# Patient Record
Sex: Male | Born: 1960 | Race: White | Hispanic: No | Marital: Married | Smoking: Never smoker
Health system: Southern US, Community
[De-identification: ages and names within clinical notes are randomized; demographics above are authoritative.]

## PROBLEM LIST (undated history)

## (undated) DIAGNOSIS — I499 Cardiac arrhythmia, unspecified: Secondary | ICD-10-CM

## (undated) DIAGNOSIS — Z9989 Dependence on other enabling machines and devices: Secondary | ICD-10-CM

## (undated) DIAGNOSIS — T8859XA Other complications of anesthesia, initial encounter: Secondary | ICD-10-CM

## (undated) DIAGNOSIS — Z9289 Personal history of other medical treatment: Secondary | ICD-10-CM

## (undated) DIAGNOSIS — Z87442 Personal history of urinary calculi: Secondary | ICD-10-CM

## (undated) DIAGNOSIS — N182 Chronic kidney disease, stage 2 (mild): Secondary | ICD-10-CM

## (undated) DIAGNOSIS — R591 Generalized enlarged lymph nodes: Secondary | ICD-10-CM

## (undated) DIAGNOSIS — T4145XA Adverse effect of unspecified anesthetic, initial encounter: Secondary | ICD-10-CM

## (undated) DIAGNOSIS — I10 Essential (primary) hypertension: Secondary | ICD-10-CM

## (undated) DIAGNOSIS — G4733 Obstructive sleep apnea (adult) (pediatric): Secondary | ICD-10-CM

## (undated) DIAGNOSIS — E7800 Pure hypercholesterolemia, unspecified: Secondary | ICD-10-CM

## (undated) DIAGNOSIS — J42 Unspecified chronic bronchitis: Secondary | ICD-10-CM

## (undated) DIAGNOSIS — I4819 Other persistent atrial fibrillation: Secondary | ICD-10-CM

## (undated) DIAGNOSIS — J189 Pneumonia, unspecified organism: Secondary | ICD-10-CM

## (undated) DIAGNOSIS — Z8719 Personal history of other diseases of the digestive system: Secondary | ICD-10-CM

## (undated) DIAGNOSIS — M1990 Unspecified osteoarthritis, unspecified site: Secondary | ICD-10-CM

## (undated) DIAGNOSIS — L508 Other urticaria: Secondary | ICD-10-CM

## (undated) HISTORY — PX: EXTRACORPOREAL SHOCK WAVE LITHOTRIPSY: SHX1557

## (undated) HISTORY — PX: ELBOW SURGERY: SHX618

## (undated) HISTORY — DX: Pure hypercholesterolemia, unspecified: E78.00

## (undated) HISTORY — PX: JOINT REPLACEMENT: SHX530

## (undated) HISTORY — DX: Generalized enlarged lymph nodes: R59.1

## (undated) HISTORY — PX: TONSILLECTOMY: SUR1361

## (undated) HISTORY — PX: SHOULDER ARTHROSCOPY W/ ROTATOR CUFF REPAIR: SHX2400

## (undated) HISTORY — DX: Essential (primary) hypertension: I10

## (undated) HISTORY — PX: COLONOSCOPY W/ BIOPSIES AND POLYPECTOMY: SHX1376

## (undated) HISTORY — PX: KNEE ARTHROSCOPY: SHX127

## (undated) HISTORY — DX: Chronic kidney disease, stage 2 (mild): N18.2

## (undated) HISTORY — DX: Other urticaria: L50.8

---
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
## 2002-03-31 ENCOUNTER — Emergency Department (HOSPITAL_COMMUNITY): Admission: EM | Admit: 2002-03-31 | Discharge: 2002-03-31 | Payer: Self-pay | Admitting: Emergency Medicine

## 2004-07-12 ENCOUNTER — Encounter: Admission: RE | Admit: 2004-07-12 | Discharge: 2004-07-12 | Payer: Self-pay | Admitting: Orthopedic Surgery

## 2005-04-23 HISTORY — PX: ANTERIOR CERVICAL DECOMP/DISCECTOMY FUSION: SHX1161

## 2005-05-09 ENCOUNTER — Ambulatory Visit (HOSPITAL_COMMUNITY): Admission: RE | Admit: 2005-05-09 | Discharge: 2005-05-10 | Payer: Self-pay | Admitting: Specialist

## 2014-07-28 ENCOUNTER — Ambulatory Visit (HOSPITAL_COMMUNITY)
Admission: RE | Admit: 2014-07-28 | Discharge: 2014-07-28 | Disposition: A | Discharge status: Home / Self Care | Payer: Managed Care, Other (non HMO) | Source: Ambulatory Visit | Attending: Cardiovascular Disease | Admitting: Cardiovascular Disease

## 2014-07-28 ENCOUNTER — Other Ambulatory Visit: Payer: Self-pay | Admitting: *Deleted

## 2014-07-28 DIAGNOSIS — R609 Edema, unspecified: Secondary | ICD-10-CM | POA: Insufficient documentation

## 2014-07-28 NOTE — Progress Notes (Signed)
Left lower extremity venous duplex completed. No evidence for DVT, SVT, or a Baker's cyst. Brianna L Mazza,RVT

## 2014-07-28 NOTE — Progress Notes (Signed)
[facility] ordered a left lower extremity venous doppler for pain and swelling

## 2014-07-29 ENCOUNTER — Telehealth (HOSPITAL_COMMUNITY): Payer: Self-pay | Admitting: *Deleted

## 2014-09-16 ENCOUNTER — Encounter: Payer: Self-pay | Admitting: *Deleted

## 2015-01-31 NOTE — H&P (Signed)
TOTAL KNEE ADMISSION H&P  Patient is being admitted for left total knee arthroplasty.  Subjective:  Chief Complaint:     Left knee primary OA / pain  HPI: Garrett Hanson, 54 y.o. male, has a history of pain and functional disability in the left knee due to arthritis and has failed non-surgical conservative treatments for greater than 12 weeks to include NSAID's and/or analgesics, corticosteriod injections, use of assistive devices and activity modification.  Onset of symptoms was gradual, starting 3+ years ago with gradually worsening course since that time. The patient noted prior procedures on the knee to include  arthroscopy and menisectomy on the left knee(s).  Patient currently rates pain in the left knee(s) at 10 out of 10 with activity. Patient has night pain, worsening of pain with activity and weight bearing, pain that interferes with activities of daily living, pain with passive range of motion, crepitus and joint swelling.  Patient has evidence of periarticular osteophytes and joint space narrowing by imaging studies. There is no active infection.  Risks, benefits and expectations were discussed with the patient.  Risks including but not limited to the risk of anesthesia, blood clots, nerve damage, blood vessel damage, failure of the prosthesis, infection and up to and including death.  Patient understand the risks, benefits and expectations and wishes to proceed with surgery.   PCP: Donovan Kail, MD  D/C Plans:      Home with HHPT  Post-op Meds:       No Rx given   Tranexamic Acid:      To be given - IV  Decadron:      Is to be given  FYI:     Xarelto post-op  Norco post-op    Past Medical History  Diagnosis Date  . Complication of anesthesia     pt states awaken during colonscopy   . Hypertension   . Dysrhythmia     hx of 10 years ago pt states was electrical currently no issues is currently not followed by cardiologist  . History of blood clots     arm during football  injury   . Pneumonia     03/2014  . History of kidney stones   . Arthritis   . History of blood transfusion     at birth     Past Surgical History  Procedure Laterality Date  . Hernia repair      left inguinal 1980  . Shoulder surgery      right approx 8 years ago times 2  . Right elbow surgery     . Crevical disc surgery       approx 7 years ago   . Lithotripsy      No prescriptions prior to admission   Allergies  Allergen Reactions  . Penicillins Anaphylaxis    Has patient had a PCN reaction causing immediate rash, facial/tongue/throat swelling, SOB or lightheadedness with hypotension: Yes Has patient had a PCN reaction causing severe rash involving mucus membranes or skin necrosis: No Has patient had a PCN reaction that required hospitalization:Yes Has patient had a PCN reaction occurring within the last 10 years: No If all of the above answers are "NO", then may proceed with Cephalosporin use.   Marland Kitchen Singulair [Montelukast Sodium] Swelling    Lips, mouth, and hand swelling   . Contrast Media [Iodinated Diagnostic Agents] Palpitations    Broke out in sweats.    Social History  Substance Use Topics  . Smoking status: Never Smoker   .  Smokeless tobacco: Never Used  . Alcohol Use: Yes     Comment: occas     No family history on file.   Review of Systems  Constitutional: Negative.   HENT: Negative.   Eyes: Negative.   Respiratory: Negative.   Cardiovascular: Negative.   Gastrointestinal: Positive for heartburn.  Genitourinary: Negative.   Musculoskeletal: Positive for joint pain.  Skin: Negative.   Neurological: Negative.   Endo/Heme/Allergies: Negative.   Psychiatric/Behavioral: Negative.     Objective:  Physical Exam  Constitutional: He is oriented to person, place, and time. He appears well-developed and well-nourished.  HENT:  Head: Normocephalic and atraumatic.  Eyes: Pupils are equal, round, and reactive to light.  Neck: Neck supple. No JVD  present. No tracheal deviation present. No thyromegaly present.  Cardiovascular: Normal rate, regular rhythm, normal heart sounds and intact distal pulses.   Respiratory: Effort normal and breath sounds normal. No stridor. No respiratory distress. He has no wheezes.  GI: Soft. There is no tenderness. There is no guarding.  Musculoskeletal:       Left knee: He exhibits decreased range of motion, swelling and bony tenderness. He exhibits no ecchymosis, no deformity, no laceration and no erythema. Tenderness found.  Lymphadenopathy:    He has no cervical adenopathy.  Neurological: He is alert and oriented to person, place, and time.  Skin: Skin is warm and dry.  Psychiatric: He has a normal mood and affect.      Imaging Review Plain radiographs demonstrate severe degenerative joint disease of the left knee(s). The overall alignment is neutral. The bone quality appears to be good for age and reported activity level.  Assessment/Plan:  End stage arthritis, left knee   The patient history, physical examination, clinical judgment of the provider and imaging studies are consistent with end stage degenerative joint disease of the left knee(s) and total knee arthroplasty is deemed medically necessary. The treatment options including medical management, injection therapy arthroscopy and arthroplasty were discussed at length. The risks and benefits of total knee arthroplasty were presented and reviewed. The risks due to aseptic loosening, infection, stiffness, patella tracking problems, thromboembolic complications and other imponderables were discussed. The patient acknowledged the explanation, agreed to proceed with the plan and consent was signed. Patient is being admitted for inpatient treatment for surgery, pain control, PT, OT, prophylactic antibiotics, VTE prophylaxis, progressive ambulation and ADL's and discharge planning. The patient is planning to be discharged home with home health  services.      Anastasio Auerbach Jaggar Benko   PA-C  02/03/2015, 3:29 PM

## 2015-02-03 ENCOUNTER — Encounter (HOSPITAL_COMMUNITY): Payer: Self-pay

## 2015-02-03 ENCOUNTER — Encounter (HOSPITAL_COMMUNITY)
Admission: RE | Admit: 2015-02-03 | Discharge: 2015-02-03 | Disposition: A | Discharge status: Home / Self Care | Payer: Managed Care, Other (non HMO) | Source: Ambulatory Visit | Attending: Orthopedic Surgery | Admitting: Orthopedic Surgery

## 2015-02-03 DIAGNOSIS — Z01818 Encounter for other preprocedural examination: Secondary | ICD-10-CM | POA: Diagnosis not present

## 2015-02-03 DIAGNOSIS — M179 Osteoarthritis of knee, unspecified: Secondary | ICD-10-CM | POA: Insufficient documentation

## 2015-02-03 HISTORY — DX: Personal history of other medical treatment: Z92.89

## 2015-02-03 HISTORY — DX: Unspecified osteoarthritis, unspecified site: M19.90

## 2015-02-03 HISTORY — DX: Adverse effect of unspecified anesthetic, initial encounter: T41.45XA

## 2015-02-03 HISTORY — DX: Other complications of anesthesia, initial encounter: T88.59XA

## 2015-02-03 HISTORY — DX: Cardiac arrhythmia, unspecified: I49.9

## 2015-02-03 HISTORY — DX: Pneumonia, unspecified organism: J18.9

## 2015-02-03 HISTORY — DX: Personal history of urinary calculi: Z87.442

## 2015-02-03 LAB — URINALYSIS, ROUTINE W REFLEX MICROSCOPIC
Bilirubin Urine: NEGATIVE
Glucose, UA: NEGATIVE mg/dL
Hgb urine dipstick: NEGATIVE
KETONES UR: NEGATIVE mg/dL
LEUKOCYTES UA: NEGATIVE
NITRITE: NEGATIVE
PROTEIN: NEGATIVE mg/dL
Specific Gravity, Urine: 1.017 (ref 1.005–1.030)
UROBILINOGEN UA: 0.2 mg/dL (ref 0.0–1.0)
pH: 5.5 (ref 5.0–8.0)

## 2015-02-03 LAB — CBC
HEMATOCRIT: 39.3 % (ref 39.0–52.0)
HEMOGLOBIN: 13.7 g/dL (ref 13.0–17.0)
MCH: 29.5 pg (ref 26.0–34.0)
MCHC: 34.9 g/dL (ref 30.0–36.0)
MCV: 84.7 fL (ref 78.0–100.0)
Platelets: 189 10*3/uL (ref 150–400)
RBC: 4.64 MIL/uL (ref 4.22–5.81)
RDW: 13.4 % (ref 11.5–15.5)
WBC: 6.5 10*3/uL (ref 4.0–10.5)

## 2015-02-03 LAB — BASIC METABOLIC PANEL
ANION GAP: 7 (ref 5–15)
BUN: 20 mg/dL (ref 6–20)
CALCIUM: 9.4 mg/dL (ref 8.9–10.3)
CO2: 31 mmol/L (ref 22–32)
Chloride: 104 mmol/L (ref 101–111)
Creatinine, Ser: 1.34 mg/dL — ABNORMAL HIGH (ref 0.61–1.24)
GFR calc non Af Amer: 58 mL/min — ABNORMAL LOW (ref >60)
GLUCOSE: 103 mg/dL — AB (ref 65–99)
POTASSIUM: 3.5 mmol/L (ref 3.5–5.1)
SODIUM: 142 mmol/L (ref 135–145)

## 2015-02-03 LAB — ABO/RH: ABO/RH(D): B POS

## 2015-02-03 LAB — SURGICAL PCR SCREEN
MRSA, PCR: NEGATIVE
STAPHYLOCOCCUS AUREUS: NEGATIVE

## 2015-02-03 LAB — PROTIME-INR
INR: 0.99 (ref 0.00–1.49)
Prothrombin Time: 13.3 seconds (ref 11.6–15.2)

## 2015-02-03 LAB — APTT: APTT: 31 s (ref 24–37)

## 2015-02-03 NOTE — Progress Notes (Signed)
Your patient has screened at an elevated risk for Obstructive Sleep Apnea using the Stop-Bang Tool during a pre-surgical vist. Patient is high risk.

## 2015-02-03 NOTE — Progress Notes (Signed)
CXR results per chart 04/20/2014

## 2015-02-03 NOTE — Patient Instructions (Addendum)
Garrett Hanson  02/03/2015   Your procedure is scheduled on: Monday February 07, 2015   Report to [facility]  Entrance [place]  elevators to 3rd floor to  Short Stay Center at 3:15 PM.  Call this number if you have problems the morning of surgery [phone]   Remember: ONLY 1 PERSON MAY GO WITH YOU TO SHORT STAY TO GET  READY MORNING OF YOUR SURGERY.  Do not eat food After Midnight but may take clear liquids till 11:15 am day of surgery then nothing by mouth.      Take these medicines the morning of surgery with A SIP OF WATER: Famotidine (Pepcid); Metoprolol; May use Veramyst nasal spray if needed (bring with you day of surgery)                               You may not have any metal on your body including hair pins and              piercings  Do not wear jewelry,lotions, powders or colognes, deodorant                          Men may shave face and neck.   Do not bring valuables to the hospital. [place] IS NOT             RESPONSIBLE   FOR VALUABLES.  Contacts, dentures or bridgework may not be worn into surgery.  Leave suitcase in the car. After surgery it may be brought to your room.                Please read over the following fact sheets you were given:MRSA INFORMATION SHEET; INCENTIVE SPIROMETER; BLOOD TRANSFUSION INFORMATION SHEET _____________________________________________________________________             [facility] - Preparing [facility] Before surgery, you can play an important role.  Because skin is not sterile, your skin needs to be as free of germs as possible.  You can reduce the number of germs on your skin by washing with CHG (chlorahexidine gluconate) soap before surgery.  CHG is an antiseptic cleaner which kills germs and bonds with the skin to continue killing germs even after washing. Please DO NOT use if you have an allergy to CHG or antibacterial soaps.  If your skin becomes reddened/irritated stop using the CHG  and inform your nurse when you arrive at Short Stay. Do not shave (including legs and underarms) for at least 48 hours prior to the first CHG shower.  You may shave your face/neck. Please follow these instructions carefully:  1.  Shower with CHG Soap the night before surgery and the  morning of Surgery.  2.  If you choose to wash your hair, wash your hair first as usual with your  normal  shampoo.  3.  After you shampoo, rinse your hair and body thoroughly to remove the  shampoo.                           4.  Use CHG as you would any other liquid soap.  You can apply chg directly  to the skin and wash  Gently with a scrungie or clean washcloth.  5.  Apply the CHG Soap to your body ONLY FROM THE NECK DOWN.   Do not use on face/ open                           Wound or open sores. Avoid contact with eyes, ears mouth and genitals (private parts).                       Wash face,  Genitals (private parts) with your normal soap.             6.  Wash thoroughly, paying special attention to the area where your surgery  will be performed.  7.  Thoroughly rinse your body with warm water from the neck down.  8.  DO NOT shower/wash with your normal soap after using and rinsing off  the CHG Soap.                9.  Pat yourself dry with a clean towel.            10.  Wear clean pajamas.            11.  Place clean sheets on your bed the night of your first shower and do not  sleep with pets. Day of Surgery : Do not apply any lotions/deodorants the morning of surgery.  Please wear clean clothes to the hospital/surgery center.  FAILURE TO FOLLOW THESE INSTRUCTIONS MAY RESULT IN THE CANCELLATION OF YOUR SURGERY PATIENT SIGNATURE_________________________________  NURSE SIGNATURE__________________________________  ________________________________________________________________________    CLEAR LIQUID DIET   Foods Allowed                                                                      Foods Excluded  Coffee and tea, regular and decaf                             liquids that you cannot  Plain Jell-O in any flavor                                             see through such as: Fruit ices (not with fruit pulp)                                     milk, soups, orange juice  Iced Popsicles                                    All solid food Carbonated beverages, regular and diet                                    Cranberry, grape and apple juices Sports drinks like Gatorade Lightly seasoned clear broth or consume(fat free) Sugar, honey syrup  Sample Menu Breakfast                                Lunch                                     Supper Cranberry juice                    Beef broth                            Chicken broth Jell-O                                     Grape juice                           Apple juice Coffee or tea                        Jell-O                                      Popsicle                                                Coffee or tea                        Coffee or tea  _____________________________________________________________________    Incentive Spirometer  An incentive spirometer is a tool that can help keep your lungs clear and active. This tool measures how well you are filling your lungs with each breath. Taking long deep breaths may help reverse or decrease the chance of developing breathing (pulmonary) problems (especially infection) following:  A long period of time when you are unable to move or be active. BEFORE THE PROCEDURE   If the spirometer includes an indicator to show your best effort, your nurse or respiratory therapist will set it to a desired goal.  If possible, sit up straight or lean slightly forward. Try not to slouch.  Hold the incentive spirometer in an upright position. INSTRUCTIONS FOR USE   Sit on the edge of your bed if possible, or sit up as far as you can in bed or on a chair.  Hold the  incentive spirometer in an upright position.  Breathe out normally.  Place the mouthpiece in your mouth and seal your lips tightly around it.  Breathe in slowly and as deeply as possible, raising the piston or the ball toward the top of the column.  Hold your breath for 3-5 seconds or for as long as possible. Allow the piston or ball to fall to the bottom of the column.  Remove the mouthpiece from your mouth and breathe out normally.  Rest for a few seconds and repeat Steps 1 through 7 at least 10 times every 1-2 hours when you are awake. Take your time and take a few normal breaths between  deep breaths.  The spirometer may include an indicator to show your best effort. Use the indicator as a goal to work toward during each repetition.  After each set of 10 deep breaths, practice coughing to be sure your lungs are clear. If you have an incision (the cut made at the time of surgery), support your incision when coughing by placing a pillow or rolled up towels firmly against it. Once you are able to get out of bed, walk around indoors and cough well. You may stop using the incentive spirometer when instructed by your caregiver.  RISKS AND COMPLICATIONS  Take your time so you do not get dizzy or light-headed.  If you are in pain, you may need to take or ask for pain medication before doing incentive spirometry. It is harder to take a deep breath if you are having pain. AFTER USE  Rest and breathe slowly and easily.  It can be helpful to keep track of a log of your progress. Your caregiver can provide you with a simple table to help with this. If you are using the spirometer at home, follow these instructions: [place] IF:   You are having difficultly using the spirometer.  You have trouble using the spirometer as often as instructed.  Your pain medication is not giving enough relief while using the spirometer.  You develop fever of 100.5 F (38.1 C) or higher. SEEK  IMMEDIATE MEDICAL CARE IF:   You cough up bloody sputum that had not been present before.  You develop fever of 102 F (38.9 C) or greater.  You develop worsening pain at or near the incision site. MAKE SURE YOU:   Understand these instructions.  Will watch your condition.  Will get help right away if you are not doing well or get worse. Document Released: 08/20/2006 Document Revised: 07/02/2011 Document Reviewed: 10/21/2006 Exit[employer] Patient Information 2014 ExitCare, [place].   ________________________________________________________________________  WHAT IS A BLOOD TRANSFUSION? Blood Transfusion Information  A transfusion is the replacement of blood or some of its parts. Blood is made up of multiple cells which provide different functions.  Red blood cells carry oxygen and are used for blood loss replacement.  White blood cells fight against infection.  Platelets control bleeding.  Plasma helps clot blood.  Other blood products are available for specialized needs, such as hemophilia or other clotting disorders. BEFORE THE TRANSFUSION  Who gives blood for transfusions?   Healthy volunteers who are fully evaluated to make sure their blood is safe. This is blood bank blood. Transfusion therapy is the safest it has ever been in the practice of medicine. Before blood is taken from a donor, a complete history is taken to make sure that person has no history of diseases nor engages in risky social behavior (examples are intravenous drug use or sexual activity with multiple partners). The donor's travel history is screened to minimize risk of transmitting infections, such as malaria. The donated blood is tested for signs of infectious diseases, such as HIV and hepatitis. The blood is then tested to be sure it is compatible with you in order to minimize the chance of a transfusion reaction. If you or a relative donates blood, this is often done in anticipation of surgery and is not  appropriate for emergency situations. It takes many days to process the donated blood. RISKS AND COMPLICATIONS Although transfusion therapy is very safe and saves many lives, the main dangers of transfusion include:   Getting an infectious disease.  Developing a transfusion reaction. This is an allergic reaction to something in the blood you were given. Every precaution is taken to prevent this. The decision to have a blood transfusion has been considered carefully by your caregiver before blood is given. Blood is not given unless the benefits outweigh the risks. AFTER THE TRANSFUSION  Right after receiving a blood transfusion, you will usually feel much better and more energetic. This is especially true if your red blood cells have gotten low (anemic). The transfusion raises the level of the red blood cells which carry oxygen, and this usually causes an energy increase.  The nurse administering the transfusion will monitor you carefully for complications. HOME CARE INSTRUCTIONS  No special instructions are needed after a transfusion. You may find your energy is better. Speak with your caregiver about any limitations on activity for underlying diseases you may have. SEEK MEDICAL CARE IF:   Your condition is not improving after your transfusion.  You develop redness or irritation at the intravenous (IV) site. SEEK IMMEDIATE MEDICAL CARE IF:  Any of the following symptoms occur over the next 12 hours:  Shaking chills.  You have a temperature by mouth above 102 F (38.9 C), not controlled by medicine.  Chest, back, or muscle pain.  People around you feel you are not acting correctly or are confused.  Shortness of breath or difficulty breathing.  Dizziness and fainting.  You get a rash or develop hives.  You have a decrease in urine output.  Your urine turns a dark color or changes to pink, red, or brown. Any of the following symptoms occur over the next 10 days:  You have a  temperature by mouth above 102 F (38.9 C), not controlled by medicine.  Shortness of breath.  Weakness after normal activity.  The white part of the eye turns yellow (jaundice).  You have a decrease in the amount of urine or are urinating less often.  Your urine turns a dark color or changes to pink, red, or brown. Document Released: 04/06/2000 Document Revised: 07/02/2011 Document Reviewed: 11/24/2007 [facility] Patient Information 2014 [place], [place].  _______________________________________________________________________

## 2015-02-04 NOTE — Progress Notes (Signed)
Clearance dr Tenny Crawross on chart for 02-07-15 surgery

## 2015-02-07 ENCOUNTER — Inpatient Hospital Stay (HOSPITAL_COMMUNITY): Payer: Managed Care, Other (non HMO) | Admitting: Anesthesiology

## 2015-02-07 ENCOUNTER — Encounter (HOSPITAL_COMMUNITY)
Admission: RE | Disposition: A | Discharge status: Home Health | Payer: Self-pay | Source: Ambulatory Visit | Attending: Orthopedic Surgery

## 2015-02-07 ENCOUNTER — Inpatient Hospital Stay (HOSPITAL_COMMUNITY)
Admission: RE | Admit: 2015-02-07 | Discharge: 2015-02-08 | DRG: 470 | Disposition: A | Discharge status: Home Health | Payer: Managed Care, Other (non HMO) | Source: Ambulatory Visit | Attending: Orthopedic Surgery | Admitting: Orthopedic Surgery

## 2015-02-07 ENCOUNTER — Encounter (HOSPITAL_COMMUNITY): Payer: Self-pay | Admitting: *Deleted

## 2015-02-07 DIAGNOSIS — Z96659 Presence of unspecified artificial knee joint: Secondary | ICD-10-CM

## 2015-02-07 DIAGNOSIS — M1712 Unilateral primary osteoarthritis, left knee: Secondary | ICD-10-CM | POA: Diagnosis present

## 2015-02-07 DIAGNOSIS — M659 Synovitis and tenosynovitis, unspecified: Secondary | ICD-10-CM | POA: Diagnosis present

## 2015-02-07 DIAGNOSIS — Z01812 Encounter for preprocedural laboratory examination: Secondary | ICD-10-CM | POA: Diagnosis not present

## 2015-02-07 DIAGNOSIS — I10 Essential (primary) hypertension: Secondary | ICD-10-CM | POA: Diagnosis present

## 2015-02-07 DIAGNOSIS — M25562 Pain in left knee: Secondary | ICD-10-CM | POA: Diagnosis present

## 2015-02-07 DIAGNOSIS — Z96652 Presence of left artificial knee joint: Secondary | ICD-10-CM

## 2015-02-07 HISTORY — PX: TOTAL KNEE ARTHROPLASTY: SHX125

## 2015-02-07 LAB — TYPE AND SCREEN
ABO/RH(D): B POS
Antibody Screen: NEGATIVE

## 2015-02-07 SURGERY — ARTHROPLASTY, KNEE, TOTAL
Anesthesia: Spinal | Site: Knee | Laterality: Left

## 2015-02-07 MED ORDER — DEXAMETHASONE SODIUM PHOSPHATE 10 MG/ML IJ SOLN
10.0000 mg | Freq: Once | INTRAMUSCULAR | Status: AC
  Administered 2015-02-07: 10 mg via INTRAVENOUS

## 2015-02-07 MED ORDER — SODIUM CHLORIDE 0.9 % IJ SOLN
INTRAMUSCULAR | Status: DC | PRN
  Administered 2015-02-07: 30 mL

## 2015-02-07 MED ORDER — CHLORHEXIDINE GLUCONATE 4 % EX LIQD: 60.0000 mL | Freq: Once | CUTANEOUS | Status: DC

## 2015-02-07 MED ORDER — KETOROLAC TROMETHAMINE 30 MG/ML IJ SOLN
INTRAMUSCULAR | Status: DC | PRN
  Administered 2015-02-07: 30 mg

## 2015-02-07 MED ORDER — LOSARTAN POTASSIUM 50 MG PO TABS
100.0000 mg | ORAL_TABLET | Freq: Every morning | ORAL | Status: DC
  Administered 2015-02-08: 100 mg via ORAL
  Filled 2015-02-07: qty 2

## 2015-02-07 MED ORDER — FAMOTIDINE 40 MG PO TABS
40.0000 mg | ORAL_TABLET | Freq: Two times a day (BID) | ORAL | Status: DC
  Administered 2015-02-07 – 2015-02-08 (×2): 40 mg via ORAL
  Filled 2015-02-07 (×3): qty 1

## 2015-02-07 MED ORDER — HYDROCODONE-ACETAMINOPHEN 7.5-325 MG PO TABS
1.0000 | ORAL_TABLET | ORAL | Status: DC
  Administered 2015-02-07 – 2015-02-08 (×4): 2 via ORAL
  Filled 2015-02-07 (×4): qty 2

## 2015-02-07 MED ORDER — PROPOFOL 10 MG/ML IV BOLUS
INTRAVENOUS | Status: AC
  Filled 2015-02-07: qty 20

## 2015-02-07 MED ORDER — FENTANYL CITRATE (PF) 100 MCG/2ML IJ SOLN
INTRAMUSCULAR | Status: DC | PRN
  Administered 2015-02-07 (×2): 50 ug via INTRAVENOUS

## 2015-02-07 MED ORDER — ALUM & MAG HYDROXIDE-SIMETH 200-200-20 MG/5ML PO SUSP: 30.0000 mL | ORAL | Status: DC | PRN

## 2015-02-07 MED ORDER — PHENYLEPHRINE HCL 10 MG/ML IJ SOLN
INTRAMUSCULAR | Status: DC | PRN
  Administered 2015-02-07 (×4): 80 ug via INTRAVENOUS

## 2015-02-07 MED ORDER — SODIUM CHLORIDE 0.9 % IR SOLN
Status: DC | PRN
  Administered 2015-02-07: 1000 mL

## 2015-02-07 MED ORDER — HYDROMORPHONE HCL 1 MG/ML IJ SOLN: 0.2500 mg | INTRAMUSCULAR | Status: DC | PRN

## 2015-02-07 MED ORDER — TRANEXAMIC ACID 1000 MG/10ML IV SOLN
1000.0000 mg | Freq: Once | INTRAVENOUS | Status: AC
  Administered 2015-02-07: 1000 mg via INTRAVENOUS
  Filled 2015-02-07: qty 10

## 2015-02-07 MED ORDER — ONDANSETRON HCL 4 MG/2ML IJ SOLN
INTRAMUSCULAR | Status: DC | PRN
  Administered 2015-02-07: 4 mg via INTRAVENOUS

## 2015-02-07 MED ORDER — BUPIVACAINE-EPINEPHRINE (PF) 0.25% -1:200000 IJ SOLN
INTRAMUSCULAR | Status: DC | PRN
  Administered 2015-02-07: 30 mL

## 2015-02-07 MED ORDER — CELECOXIB 200 MG PO CAPS
200.0000 mg | ORAL_CAPSULE | Freq: Two times a day (BID) | ORAL | Status: DC
  Administered 2015-02-07 – 2015-02-08 (×2): 200 mg via ORAL
  Filled 2015-02-07 (×3): qty 1

## 2015-02-07 MED ORDER — MENTHOL 3 MG MT LOZG: 1.0000 | LOZENGE | OROMUCOSAL | Status: DC | PRN

## 2015-02-07 MED ORDER — BISACODYL 10 MG RE SUPP: 10.0000 mg | Freq: Every day | RECTAL | Status: DC | PRN

## 2015-02-07 MED ORDER — ONDANSETRON HCL 4 MG PO TABS: 4.0000 mg | ORAL_TABLET | Freq: Four times a day (QID) | ORAL | Status: DC | PRN

## 2015-02-07 MED ORDER — ATORVASTATIN CALCIUM 40 MG PO TABS
40.0000 mg | ORAL_TABLET | Freq: Every evening | ORAL | Status: DC
  Administered 2015-02-07: 40 mg via ORAL
  Filled 2015-02-07 (×2): qty 1

## 2015-02-07 MED ORDER — EPHEDRINE SULFATE 50 MG/ML IJ SOLN
INTRAMUSCULAR | Status: DC | PRN
  Administered 2015-02-07: 10 mg via INTRAVENOUS

## 2015-02-07 MED ORDER — HYDROMORPHONE HCL 1 MG/ML IJ SOLN
0.5000 mg | INTRAMUSCULAR | Status: DC | PRN
  Administered 2015-02-08: 1 mg via INTRAVENOUS
  Filled 2015-02-07 (×2): qty 1

## 2015-02-07 MED ORDER — METOCLOPRAMIDE HCL 5 MG/ML IJ SOLN: 5.0000 mg | Freq: Three times a day (TID) | INTRAMUSCULAR | Status: DC | PRN

## 2015-02-07 MED ORDER — BUPIVACAINE-EPINEPHRINE (PF) 0.25% -1:200000 IJ SOLN
INTRAMUSCULAR | Status: AC
  Filled 2015-02-07: qty 30

## 2015-02-07 MED ORDER — MAGNESIUM CITRATE PO SOLN: 1.0000 | Freq: Once | ORAL | Status: DC | PRN

## 2015-02-07 MED ORDER — METOCLOPRAMIDE HCL 10 MG PO TABS: 5.0000 mg | ORAL_TABLET | Freq: Three times a day (TID) | ORAL | Status: DC | PRN

## 2015-02-07 MED ORDER — FLUTICASONE FUROATE 27.5 MCG/SPRAY NA SUSP: 1.0000 | Freq: Every morning | NASAL | Status: DC

## 2015-02-07 MED ORDER — PHENYLEPHRINE 40 MCG/ML (10ML) SYRINGE FOR IV PUSH (FOR BLOOD PRESSURE SUPPORT)
PREFILLED_SYRINGE | INTRAVENOUS | Status: AC
  Filled 2015-02-07: qty 10

## 2015-02-07 MED ORDER — POLYETHYLENE GLYCOL 3350 17 G PO PACK
17.0000 g | PACK | Freq: Two times a day (BID) | ORAL | Status: DC
  Administered 2015-02-07 – 2015-02-08 (×2): 17 g via ORAL

## 2015-02-07 MED ORDER — FENTANYL CITRATE (PF) 100 MCG/2ML IJ SOLN
INTRAMUSCULAR | Status: AC
  Filled 2015-02-07: qty 4

## 2015-02-07 MED ORDER — CEFAZOLIN SODIUM-DEXTROSE 2-3 GM-% IV SOLR: 2.0000 g | INTRAVENOUS | Status: DC

## 2015-02-07 MED ORDER — VANCOMYCIN HCL IN DEXTROSE 1-5 GM/200ML-% IV SOLN
1000.0000 mg | Freq: Two times a day (BID) | INTRAVENOUS | Status: AC
  Administered 2015-02-08: 1000 mg via INTRAVENOUS
  Filled 2015-02-07: qty 200

## 2015-02-07 MED ORDER — DEXAMETHASONE SODIUM PHOSPHATE 10 MG/ML IJ SOLN
INTRAMUSCULAR | Status: AC
Start: 2015-02-07 — End: 2015-02-07
  Filled 2015-02-07: qty 1

## 2015-02-07 MED ORDER — VANCOMYCIN HCL 10 G IV SOLR
1500.0000 mg | INTRAVENOUS | Status: AC
  Administered 2015-02-07: 1500 mg via INTRAVENOUS
  Filled 2015-02-07: qty 1500

## 2015-02-07 MED ORDER — DEXTROSE 5 % IV SOLN
500.0000 mg | Freq: Four times a day (QID) | INTRAVENOUS | Status: DC | PRN
  Filled 2015-02-07: qty 5

## 2015-02-07 MED ORDER — LACTATED RINGERS IV SOLN: INTRAVENOUS | Status: DC

## 2015-02-07 MED ORDER — FLUTICASONE PROPIONATE 50 MCG/ACT NA SUSP
1.0000 | Freq: Every day | NASAL | Status: DC
  Filled 2015-02-07: qty 16

## 2015-02-07 MED ORDER — BUPIVACAINE IN DEXTROSE 0.75-8.25 % IT SOLN
INTRATHECAL | Status: DC | PRN
  Administered 2015-02-07: 2 mL via INTRATHECAL

## 2015-02-07 MED ORDER — HYDROCHLOROTHIAZIDE 25 MG PO TABS
25.0000 mg | ORAL_TABLET | Freq: Every day | ORAL | Status: DC
  Administered 2015-02-08: 25 mg via ORAL
  Filled 2015-02-07: qty 1

## 2015-02-07 MED ORDER — METOPROLOL TARTRATE 25 MG PO TABS
25.0000 mg | ORAL_TABLET | Freq: Two times a day (BID) | ORAL | Status: DC
  Administered 2015-02-07 – 2015-02-08 (×2): 25 mg via ORAL
  Filled 2015-02-07 (×3): qty 1

## 2015-02-07 MED ORDER — SODIUM CHLORIDE 0.9 % IV SOLN
INTRAVENOUS | Status: DC
  Administered 2015-02-07: 22:00:00 via INTRAVENOUS
  Filled 2015-02-07 (×3): qty 1000

## 2015-02-07 MED ORDER — MIDAZOLAM HCL 5 MG/5ML IJ SOLN
INTRAMUSCULAR | Status: DC | PRN
  Administered 2015-02-07: 2 mg via INTRAVENOUS

## 2015-02-07 MED ORDER — LACTATED RINGERS IV SOLN
INTRAVENOUS | Status: DC | PRN
  Administered 2015-02-07 (×2): via INTRAVENOUS

## 2015-02-07 MED ORDER — SODIUM CHLORIDE 0.9 % IJ SOLN
INTRAMUSCULAR | Status: AC
  Filled 2015-02-07: qty 50

## 2015-02-07 MED ORDER — ESMOLOL HCL 10 MG/ML IV SOLN
INTRAVENOUS | Status: AC
  Filled 2015-02-07: qty 10

## 2015-02-07 MED ORDER — PROPOFOL 500 MG/50ML IV EMUL
INTRAVENOUS | Status: DC | PRN
  Administered 2015-02-07: 100 ug/kg/min via INTRAVENOUS

## 2015-02-07 MED ORDER — FERROUS SULFATE 325 (65 FE) MG PO TABS
325.0000 mg | ORAL_TABLET | Freq: Three times a day (TID) | ORAL | Status: DC
  Administered 2015-02-08 (×2): 325 mg via ORAL
  Filled 2015-02-07 (×4): qty 1

## 2015-02-07 MED ORDER — DEXAMETHASONE SODIUM PHOSPHATE 10 MG/ML IJ SOLN
10.0000 mg | Freq: Once | INTRAMUSCULAR | Status: AC
  Administered 2015-02-08: 10 mg via INTRAVENOUS
  Filled 2015-02-07: qty 1

## 2015-02-07 MED ORDER — METHOCARBAMOL 500 MG PO TABS
500.0000 mg | ORAL_TABLET | Freq: Four times a day (QID) | ORAL | Status: DC | PRN
  Administered 2015-02-08: 500 mg via ORAL
  Filled 2015-02-07: qty 1

## 2015-02-07 MED ORDER — ONDANSETRON HCL 4 MG/2ML IJ SOLN: 4.0000 mg | Freq: Four times a day (QID) | INTRAMUSCULAR | Status: DC | PRN

## 2015-02-07 MED ORDER — PHENOL 1.4 % MT LIQD: 1.0000 | OROMUCOSAL | Status: DC | PRN

## 2015-02-07 MED ORDER — ONDANSETRON HCL 4 MG/2ML IJ SOLN
INTRAMUSCULAR | Status: AC
  Filled 2015-02-07: qty 2

## 2015-02-07 MED ORDER — RIVAROXABAN 10 MG PO TABS
10.0000 mg | ORAL_TABLET | ORAL | Status: DC
  Administered 2015-02-08: 10 mg via ORAL
  Filled 2015-02-07 (×2): qty 1

## 2015-02-07 MED ORDER — DOCUSATE SODIUM 100 MG PO CAPS
100.0000 mg | ORAL_CAPSULE | Freq: Two times a day (BID) | ORAL | Status: DC
  Administered 2015-02-07 – 2015-02-08 (×2): 100 mg via ORAL

## 2015-02-07 MED ORDER — KETOROLAC TROMETHAMINE 30 MG/ML IJ SOLN
INTRAMUSCULAR | Status: AC
  Filled 2015-02-07: qty 1

## 2015-02-07 MED ORDER — DIPHENHYDRAMINE HCL 25 MG PO CAPS: 25.0000 mg | ORAL_CAPSULE | Freq: Four times a day (QID) | ORAL | Status: DC | PRN

## 2015-02-07 MED ORDER — MIDAZOLAM HCL 2 MG/2ML IJ SOLN
INTRAMUSCULAR | Status: AC
  Filled 2015-02-07: qty 4

## 2015-02-07 SURGICAL SUPPLY — 53 items
BAG DECANTER FOR FLEXI CONT (MISCELLANEOUS) IMPLANT
BAG ZIPLOCK 12X15 (MISCELLANEOUS) IMPLANT
BANDAGE ELASTIC 6 VELCRO ST LF (GAUZE/BANDAGES/DRESSINGS) ×2 IMPLANT
BANDAGE ESMARK 6X9 LF (GAUZE/BANDAGES/DRESSINGS) ×1 IMPLANT
BLADE SAW SGTL 13.0X1.19X90.0M (BLADE) ×2 IMPLANT
BNDG ESMARK 6X9 LF (GAUZE/BANDAGES/DRESSINGS) ×2
BOWL SMART MIX CTS (DISPOSABLE) ×2 IMPLANT
CAPT KNEE TOTAL 3 ATTUNE ×2 IMPLANT
CEMENT HV SMART SET (Cement) ×4 IMPLANT
CUFF TOURN SGL QUICK 34 (TOURNIQUET CUFF) ×1
CUFF TRNQT CYL 34X4X40X1 (TOURNIQUET CUFF) ×1 IMPLANT
DECANTER SPIKE VIAL GLASS SM (MISCELLANEOUS) ×2 IMPLANT
DRAPE EXTREMITY T 121X128X90 (DRAPE) ×2 IMPLANT
DRAPE POUCH INSTRU U-SHP 10X18 (DRAPES) ×2 IMPLANT
DRAPE U-SHAPE 47X51 STRL (DRAPES) ×2 IMPLANT
DRSG AQUACEL AG ADV 3.5X10 (GAUZE/BANDAGES/DRESSINGS) ×2 IMPLANT
DURAPREP 26ML APPLICATOR (WOUND CARE) ×4 IMPLANT
ELECT REM PT RETURN 9FT ADLT (ELECTROSURGICAL) ×2
ELECTRODE REM PT RTRN 9FT ADLT (ELECTROSURGICAL) ×1 IMPLANT
FACESHIELD WRAPAROUND (MASK) ×10 IMPLANT
GLOVE BIOGEL PI IND STRL 7.5 (GLOVE) ×1 IMPLANT
GLOVE BIOGEL PI IND STRL 8.5 (GLOVE) ×1 IMPLANT
GLOVE BIOGEL PI INDICATOR 7.5 (GLOVE) ×1
GLOVE BIOGEL PI INDICATOR 8.5 (GLOVE) ×1
GLOVE ECLIPSE 8.0 STRL XLNG CF (GLOVE) ×2 IMPLANT
GLOVE ORTHO TXT STRL SZ7.5 (GLOVE) ×4 IMPLANT
GOWN SPEC L3 XXLG W/TWL (GOWN DISPOSABLE) ×2 IMPLANT
GOWN STRL REUS W/TWL LRG LVL3 (GOWN DISPOSABLE) ×2 IMPLANT
HANDPIECE INTERPULSE COAX TIP (DISPOSABLE) ×1
KIT BASIN OR (CUSTOM PROCEDURE TRAY) ×2 IMPLANT
LIQUID BAND (GAUZE/BANDAGES/DRESSINGS) ×2 IMPLANT
MANIFOLD NEPTUNE II (INSTRUMENTS) ×2 IMPLANT
NDL SAFETY ECLIPSE 18X1.5 (NEEDLE) ×1 IMPLANT
NEEDLE HYPO 18GX1.5 SHARP (NEEDLE) ×1
PACK TOTAL JOINT (CUSTOM PROCEDURE TRAY) ×2 IMPLANT
PEN SKIN MARKING BROAD (MISCELLANEOUS) ×2 IMPLANT
POSITIONER SURGICAL ARM (MISCELLANEOUS) ×2 IMPLANT
SET HNDPC FAN SPRY TIP SCT (DISPOSABLE) ×1 IMPLANT
SET PAD KNEE POSITIONER (MISCELLANEOUS) ×2 IMPLANT
SUCTION FRAZIER 12FR DISP (SUCTIONS) ×2 IMPLANT
SUT MNCRL AB 4-0 PS2 18 (SUTURE) ×2 IMPLANT
SUT VIC AB 1 CT1 36 (SUTURE) ×2 IMPLANT
SUT VIC AB 2-0 CT1 27 (SUTURE) ×3
SUT VIC AB 2-0 CT1 TAPERPNT 27 (SUTURE) ×3 IMPLANT
SUT VLOC 180 0 24IN GS25 (SUTURE) ×2 IMPLANT
SYR 50ML LL SCALE MARK (SYRINGE) ×2 IMPLANT
TOWEL OR 17X26 10 PK STRL BLUE (TOWEL DISPOSABLE) ×2 IMPLANT
TOWEL OR NON WOVEN STRL DISP B (DISPOSABLE) ×2 IMPLANT
TRAY FOLEY W/METER SILVER 14FR (SET/KITS/TRAYS/PACK) IMPLANT
TRAY FOLEY W/METER SILVER 16FR (SET/KITS/TRAYS/PACK) ×2 IMPLANT
WATER STERILE IRR 1500ML POUR (IV SOLUTION) ×2 IMPLANT
WRAP KNEE MAXI GEL POST OP (GAUZE/BANDAGES/DRESSINGS) ×2 IMPLANT
YANKAUER SUCT BULB TIP 10FT TU (MISCELLANEOUS) ×2 IMPLANT

## 2015-02-07 NOTE — Interval H&P Note (Signed)
History and Physical Interval Note:  02/07/2015 4:02 PM  Roseanna RainbowMark J Witcher  has presented today for surgery, with the diagnosis of LEFT KNEE OA  The various methods of treatment have been discussed with the patient and family. After consideration of risks, benefits and other options for treatment, the patient has consented to  Procedure(s): LEFT TOTAL KNEE ARTHROPLASTY (Left) as a surgical intervention .  The patient's history has been reviewed, patient examined, no change in status, stable for surgery.  I have reviewed the patient's chart and labs.  Questions were answered to the patient's satisfaction.     Shelda PalLIN,Annett Boxwell D

## 2015-02-07 NOTE — Anesthesia Preprocedure Evaluation (Addendum)
Anesthesia Evaluation  Patient identified by MRN, date of birth, ID band Patient awake    Reviewed: Allergy & Precautions, H&P , NPO status , Patient's Chart, lab work & pertinent test results, reviewed documented beta blocker date and time   Airway Mallampati: II  TM Distance: >3 FB Neck ROM: full    Dental no notable dental hx. (+) Dental Advisory Given, Teeth Intact   Pulmonary neg pulmonary ROS,    Pulmonary exam normal breath sounds clear to auscultation       Cardiovascular Exercise Tolerance: Good hypertension, Pt. on medications and Pt. on home beta blockers Normal cardiovascular exam+ dysrhythmias  Rhythm:regular Rate:Normal     Neuro/Psych negative neurological ROS  negative psych ROS   GI/Hepatic negative GI ROS, Neg liver ROS,   Endo/Other  negative endocrine ROS  Renal/GU negative Renal ROSCRT 1.34  negative genitourinary   Musculoskeletal   Abdominal (+) + obese,   Peds  Hematology negative hematology ROS (+)   Anesthesia Other Findings   Reproductive/Obstetrics negative OB ROS                            Anesthesia Physical Anesthesia Plan  ASA: II  Anesthesia Plan: Spinal   Post-op Pain Management:    Induction:   Airway Management Planned:   Additional Equipment:   Intra-op Plan:   Post-operative Plan:   Informed Consent: I have reviewed the patients History and Physical, chart, labs and discussed the procedure including the risks, benefits and alternatives for the proposed anesthesia with the patient or authorized representative who has indicated his/her understanding and acceptance.   Dental Advisory Given  Plan Discussed with: CRNA and Surgeon  Anesthesia Plan Comments:         Anesthesia Quick Evaluation

## 2015-02-07 NOTE — Anesthesia Procedure Notes (Signed)
Spinal Patient location during procedure: OR Start time: 02/07/2015 5:46 PM End time: 02/07/2015 5:50 PM Staffing Anesthesiologist: Rod Mae Resident/CRNA: Lajuana Carry E Performed by: resident/CRNA  Preanesthetic Checklist Completed: patient identified, site marked, surgical consent, pre-op evaluation, timeout performed, IV checked, risks and benefits discussed and monitors and equipment checked Spinal Block Patient position: sitting Prep: Betadine Patient monitoring: heart rate, continuous pulse ox and blood pressure Location: L3-4 Injection technique: single-shot Needle Needle type: Sprotte  Needle gauge: 24 G Needle length: 10 cm Assessment Sensory level: T6 Additional Notes Expiration date of kit checked and confirmed. First attempt clear CSF, neg heme, neg paresthesia. Patient tolerated procedure well, without complications. Return to supine with assistance

## 2015-02-07 NOTE — Transfer of Care (Signed)
Immediate Anesthesia Transfer of Care Note  Patient: Garrett RainbowMark J Vales  Procedure(s) Performed: Procedure(s): LEFT TOTAL KNEE ARTHROPLASTY (Left)  Patient Location: PACU  Anesthesia Type:Spinal  Level of Consciousness:  sedated, patient cooperative and responds to stimulation  Airway & Oxygen Therapy:Patient Spontanous Breathing   Post-op Assessment:  Report given to PACU RN and Post -op Vital signs reviewed and stable  Post vital signs:  Reviewed and stable  Last Vitals:  Filed Vitals:   02/07/15 1418  BP: 174/98  Pulse: 74  Temp: 36.7 C  Resp: 16    Complications: No apparent anesthesia complications

## 2015-02-07 NOTE — Op Note (Signed)
NAME:  Garrett Hanson [facility]                      MEDICAL RECORD NO.:  [id number]                             FACILITY:  [facility]      PHYSICIAN:  Madlyn Frankel. Charlann Boxer, M.D.  DATE OF BIRTH:  July 20, 1960      DATE OF PROCEDURE:  02/07/2015                                     OPERATIVE REPORT         PREOPERATIVE DIAGNOSIS:  Left knee osteoarthritis.      POSTOPERATIVE DIAGNOSIS:  Left knee osteoarthritis.      FINDINGS:  The patient was noted to have complete loss of cartilage and   bone-on-bone arthritis with associated osteophytes in the medial and patellofemoral compartments of   the knee with a significant synovitis and associated effusion.      PROCEDURE:  Left total knee replacement.      COMPONENTS USED:  DePuy ATTune rotating platform posterior stabilized knee   system, a size 8 femur, 8 tibia, 5 mm PS AOX insert, and 38 anatomic patellar   button.      SURGEON:  Madlyn Frankel. Charlann Boxer, M.D.      ASSISTANT:  Lanney Gins, PA-C.      ANESTHESIA:  Spinal.      SPECIMENS:  None.      COMPLICATION:  None.      DRAINS:  One Hemovac.  EBL: <100cc      TOURNIQUET TIME:   Total Tourniquet Time Documented: Thigh (Left) - 37 minutes Total: Thigh (Left) - 37 minutes      The patient was stable to the recovery room.      INDICATION FOR PROCEDURE:  Garrett Hanson is a 54 y.o. male patient of   mine.  The patient had been seen, evaluated, and treated conservatively in the   office with medication, activity modification, and injections.  The patient had   radiographic changes of bone-on-bone arthritis with endplate sclerosis and osteophytes noted.      The patient failed conservative measures including medication, injections, and activity modification, and at this point was ready for more definitive measures.   Based on the radiographic changes and failed conservative measures, the patient   decided to proceed with total knee replacement.  Risks of infection,   DVT, component failure,  need for revision surgery, postop course, and   expectations were all   discussed and reviewed.  Consent was obtained for benefit of pain   relief.      PROCEDURE IN DETAIL:  The patient was brought to the operative theater.   Once adequate anesthesia, preoperative antibiotics, 1.5 gm of Vancomycin, 1 gm of Tranexamic Acid, and 10 mg of Decadron administered, the patient was positioned supine with the left thigh tourniquet placed.  The  left lower extremity was prepped and draped in sterile fashion.  A time-   out was performed identifying the patient, planned procedure, and   extremity.      The left lower extremity was placed in the [facility] leg holder.  The leg was   exsanguinated, tourniquet elevated to 250 mmHg.  A midline incision was   made followed by  median parapatellar arthrotomy.  Following initial   exposure, attention was first directed to the patella.  Precut   measurement was noted to be 28 mm.  I resected down to 15 mm and used a   38 patellar button to restore patellar height as well as cover the cut   surface.      The lug holes were drilled and a metal shim was placed to protect the   patella from retractors and saw blades.      At this point, attention was now directed to the femur.  The femoral   canal was opened with a drill, irrigated to try to prevent fat emboli.  An   intramedullary rod was passed at 3 degrees valgus, 9 mm of bone was   resected off the distal femur.  Following this resection, the tibia was   subluxated anteriorly.  Using the extramedullary guide, 2 mm of bone was resected off   the proximal medial tibia.  We confirmed the gap would be   stable medially and laterally with a size 5 mm insert as well as confirmed   the cut was perpendicular in the coronal plane, checking with an alignment rod.      Once this was done, I sized the femur to be a size 8 in the anterior-   posterior dimension, chose a standard component based on medial and   lateral  dimension.  The size 8 rotation block was then pinned in   position anterior referenced using the C-clamp to set rotation.  The   anterior, posterior, and  chamfer cuts were made without difficulty nor   notching making certain that I was along the anterior cortex to help   with flexion gap stability.      The final box cut was made off the lateral aspect of distal femur.      At this point, the tibia was sized to be a size 8, the size 8 tray was   then pinned in position through the medial third of the tubercle,   drilled, and keel punched.  Trial reduction was now carried with a 8 femur,  8 tibia, a size 5 mm insert, and the 38 patella botton.  The knee was brought to   extension, full extension with good flexion stability with the patella   tracking through the trochlea without application of pressure.  Given   all these findings, the trial components removed.  Final components were   opened and cement was mixed.  The knee was irrigated with normal saline   solution and pulse lavage.  The synovial lining was   then injected with 30cc of 0.25% Marcaine with epinephrine and 1 cc of Toradol plus 30 cc of NS for a  total of 61 cc.      The knee was irrigated.  Final implants were then cemented onto clean and   dried cut surfaces of bone with the knee brought to extension with a size 5 mm trial insert.      Once the cement had fully cured, the excess cement was removed   throughout the knee.  I confirmed I was satisfied with the range of   motion and stability, and the final size 5 mm PS AOX insert was chosen.  It was   placed into the knee.      The tourniquet had been let down at 37 minutes.  No significant   hemostasis required.  The   extensor mechanism  was then reapproximated using #1 Vicryl and # 0 V-lock sutures with the knee   in flexion.  The   remaining wound was closed with 2-0 Vicryl and running 4-0 Monocryl.   The knee was cleaned, dried, dressed sterilely using Dermabond  and   Aquacel dressing.  The patient was then   brought to recovery room in stable condition, tolerating the procedure   well.   Please note that Physician Assistant, Lanney Gins, PA-C, was present for the entirety of the case, and was utilized for pre-operative positioning, peri-operative retractor management, general facilitation of the procedure.  He was also utilized for primary wound closure at the end of the case.              Madlyn Frankel Charlann Boxer, M.D.    02/07/2015 7:21 PM

## 2015-02-07 NOTE — Anesthesia Postprocedure Evaluation (Signed)
  Anesthesia Post-op Note  Patient: Garrett Hanson  Procedure(s) Performed: Procedure(s) (LRB): LEFT TOTAL KNEE ARTHROPLASTY (Left)  Patient Location: PACU  Anesthesia Type: Spinal  Level of Consciousness: awake and alert   Airway and Oxygen Therapy: Patient Spontanous Breathing  Post-op Pain: mild  Post-op Assessment: Post-op Vital signs reviewed, Patient's Cardiovascular Status Stable, Respiratory Function Stable, Patent Airway and No signs of Nausea or vomiting  Last Vitals:  Filed Vitals:   02/07/15 2030  BP: 116/73  Pulse: 69  Temp: 36.3 C  Resp: 15    Post-op Vital Signs: stable   Complications: No apparent anesthesia complications

## 2015-02-08 ENCOUNTER — Encounter (HOSPITAL_COMMUNITY): Payer: Self-pay | Admitting: Orthopedic Surgery

## 2015-02-08 LAB — BASIC METABOLIC PANEL
ANION GAP: 10 (ref 5–15)
BUN: 22 mg/dL — ABNORMAL HIGH (ref 6–20)
CHLORIDE: 100 mmol/L — AB (ref 101–111)
CO2: 28 mmol/L (ref 22–32)
CREATININE: 1.25 mg/dL — AB (ref 0.61–1.24)
Calcium: 8.6 mg/dL — ABNORMAL LOW (ref 8.9–10.3)
GFR calc non Af Amer: >60 mL/min (ref >60)
Glucose, Bld: 164 mg/dL — ABNORMAL HIGH (ref 65–99)
POTASSIUM: 3.6 mmol/L (ref 3.5–5.1)
SODIUM: 138 mmol/L (ref 135–145)

## 2015-02-08 LAB — CBC
HCT: 37.2 % — ABNORMAL LOW (ref 39.0–52.0)
HEMOGLOBIN: 12.8 g/dL — AB (ref 13.0–17.0)
MCH: 29.2 pg (ref 26.0–34.0)
MCHC: 34.4 g/dL (ref 30.0–36.0)
MCV: 84.7 fL (ref 78.0–100.0)
Platelets: 225 10*3/uL (ref 150–400)
RBC: 4.39 MIL/uL (ref 4.22–5.81)
RDW: 13 % (ref 11.5–15.5)
WBC: 14.7 10*3/uL — AB (ref 4.0–10.5)

## 2015-02-08 MED ORDER — RIVAROXABAN 10 MG PO TABS: 10.0000 mg | ORAL_TABLET | ORAL | Status: DC

## 2015-02-08 MED ORDER — METHOCARBAMOL 500 MG PO TABS: 500.0000 mg | ORAL_TABLET | Freq: Four times a day (QID) | ORAL | Status: DC | PRN

## 2015-02-08 MED ORDER — FERROUS SULFATE 325 (65 FE) MG PO TABS: 325.0000 mg | ORAL_TABLET | Freq: Three times a day (TID) | ORAL | Status: DC

## 2015-02-08 MED ORDER — POLYETHYLENE GLYCOL 3350 17 G PO PACK: 17.0000 g | PACK | Freq: Two times a day (BID) | ORAL | Status: DC

## 2015-02-08 MED ORDER — HYDROCODONE-ACETAMINOPHEN 7.5-325 MG PO TABS: 1.0000 | ORAL_TABLET | ORAL | Status: DC | PRN

## 2015-02-08 MED ORDER — HYDROMORPHONE HCL 2 MG PO TABS: 2.0000 mg | ORAL_TABLET | ORAL | Status: DC | PRN

## 2015-02-08 MED ORDER — DOCUSATE SODIUM 100 MG PO CAPS: 100.0000 mg | ORAL_CAPSULE | Freq: Two times a day (BID) | ORAL | Status: DC

## 2015-02-08 NOTE — Discharge Instructions (Addendum)
Information on my medicine - XARELTO® (Rivaroxaban) ° °This medication education was reviewed with me or my healthcare representative as part of my discharge preparation.  The pharmacist that spoke with me during my hospital stay was:  Glogovac,nikola, RPH ° °Why was Xarelto® prescribed for you? °Xarelto® was prescribed for you to reduce the risk of blood clots forming after orthopedic surgery. The medical term for these abnormal blood clots is venous thromboembolism (VTE). ° °What do you need to know about xarelto® ? °Take your Xarelto® ONCE DAILY at the same time every day. °You may take it either with or without food. ° °If you have difficulty swallowing the tablet whole, you may crush it and mix in applesauce just prior to taking your dose. ° °Take Xarelto® exactly as prescribed by your doctor and DO NOT stop taking Xarelto® without talking to the doctor who prescribed the medication.  Stopping without other VTE prevention medication to take the place of Xarelto® may increase your risk of developing a clot. ° °After discharge, you should have regular check-up appointments with your healthcare provider that is prescribing your Xarelto®.   ° °What do you do if you miss a dose? °If you miss a dose, take it as soon as you remember on the same day then continue your regularly scheduled once daily regimen the next day. Do not take two doses of Xarelto® on the same day.  ° °Important Safety Information °A possible side effect of Xarelto® is bleeding. You should call your healthcare provider right away if you experience any of the following: °? Bleeding from an injury or your nose that does not stop. °? Unusual colored urine (red or dark brown) or unusual colored stools (red or black). °? Unusual bruising for unknown reasons. °? A serious fall or if you hit your head (even if there is no bleeding). ° °Some medicines may interact with Xarelto® and might increase your risk of bleeding while on Xarelto®. To help avoid  this, consult your healthcare provider or pharmacist prior to using any new prescription or non-prescription medications, including herbals, vitamins, non-steroidal anti-inflammatory drugs (NSAIDs) and supplements. ° °This website has more information on Xarelto®: www.xarelto.com. ° °_____________________________________________________________________________________________________________________________________ ° ° ° ° ° °INSTRUCTIONS AFTER JOINT REPLACEMENT  ° °o Remove items at home which could result in a fall. This includes throw rugs or furniture in walking pathways °o ICE to the affected joint every three hours while awake for 30 minutes at a time, for at least the first 3-5 days, and then as needed for pain and swelling.  Continue to use ice for pain and swelling. You may notice swelling that will progress down to the foot and ankle.  This is normal after surgery.  Elevate your leg when you are not up walking on it.   °o Continue to use the breathing machine you got in the hospital (incentive spirometer) which will help keep your temperature down.  It is common for your temperature to cycle up and down following surgery, especially at night when you are not up moving around and exerting yourself.  The breathing machine keeps your lungs expanded and your temperature down. ° ° °DIET:  As you were doing prior to hospitalization, we recommend a well-balanced diet. ° °DRESSING / WOUND CARE / SHOWERING ° °Keep the surgical dressing until follow up.  The dressing is water proof, so you can shower without any extra covering.  IF THE DRESSING FALLS OFF or the wound gets wet inside, change the dressing   with sterile gauze.  Please use good hand washing techniques before changing the dressing.  Do not use any lotions or creams on the incision until instructed by your surgeon.   ° °ACTIVITY ° °o Increase activity slowly as tolerated, but follow the weight bearing instructions below.   °o No driving for 6 weeks or  until further direction given by your physician.  You cannot drive while taking narcotics.  °o No lifting or carrying greater than 10 lbs. until further directed by your surgeon. °o Avoid periods of inactivity such as sitting longer than an hour when not asleep. This helps prevent blood clots.  °o You may return to work once you are authorized by your doctor.  ° ° ° °WEIGHT BEARING  ° °Weight bearing as tolerated with assist device (walker, cane, etc) as directed, use it as long as suggested by your surgeon or therapist, typically at least 4-6 weeks. ° ° °EXERCISES ° °Results after joint replacement surgery are often greatly improved when you follow the exercise, range of motion and muscle strengthening exercises prescribed by your doctor. Safety measures are also important to protect the joint from further injury. Any time any of these exercises cause you to have increased pain or swelling, decrease what you are doing until you are comfortable again and then slowly increase them. If you have problems or questions, call your caregiver or physical therapist for advice.  ° °Rehabilitation is important following a joint replacement. After just a few days of immobilization, the muscles of the leg can become weakened and shrink (atrophy).  These exercises are designed to build up the tone and strength of the thigh and leg muscles and to improve motion. Often times heat used for twenty to thirty minutes before working out will loosen up your tissues and help with improving the range of motion but do not use heat for the first two weeks following surgery (sometimes heat can increase post-operative swelling).  ° °These exercises can be done on a training (exercise) mat, on the floor, on a table or on a bed. Use whatever works the best and is most comfortable for you.    Use music or television while you are exercising so that the exercises are a pleasant break in your day. This will make your life better with the exercises  acting as a break in your routine that you can look forward to.   Perform all exercises about fifteen times, three times per day or as directed.  You should exercise both the operative leg and the other leg as well. ° °Exercises include: °  °• Quad Sets - Tighten up the muscle on the front of the thigh (Quad) and hold for 5-10 seconds.   °• Straight Leg Raises - With your knee straight (if you were given a brace, keep it on), lift the leg to 60 degrees, hold for 3 seconds, and slowly lower the leg.  Perform this exercise against resistance later as your leg gets stronger.  °• Leg Slides: Lying on your back, slowly slide your foot toward your buttocks, bending your knee up off the floor (only go as far as is comfortable). Then slowly slide your foot back down until your leg is flat on the floor again.  °• Angel Wings: Lying on your back spread your legs to the side as far apart as you can without causing discomfort.  °• Hamstring Strength:  Lying on your back, push your heel against the floor with your leg straight   by tightening up the muscles of your buttocks.  Repeat, but this time bend your knee to a comfortable angle, and push your heel against the floor.  You may put a pillow under the heel to make it more comfortable if necessary.  ° °A rehabilitation program following joint replacement surgery can speed recovery and prevent re-injury in the future due to weakened muscles. Contact your doctor or a physical therapist for more information on knee rehabilitation.  ° ° °CONSTIPATION ° °Constipation is defined medically as fewer than three stools per week and severe constipation as less than one stool per week.  Even if you have a regular bowel pattern at home, your normal regimen is likely to be disrupted due to multiple reasons following surgery.  Combination of anesthesia, postoperative narcotics, change in appetite and fluid intake all can affect your bowels.  ° °YOU MUST use at least one of the following  options; they are listed in order of increasing strength to get the job done.  They are all available over the counter, and you may need to use some, POSSIBLY even all of these options:   ° °Drink plenty of fluids (prune juice may be helpful) and high fiber foods °Colace 100 mg by mouth twice a day  °Senokot for constipation as directed and as needed Dulcolax (bisacodyl), take with full glass of water  °Miralax (polyethylene glycol) once or twice a day as needed. ° °If you have tried all these things and are unable to have a bowel movement in the first 3-4 days after surgery call either your surgeon or your primary doctor.   ° °If you experience loose stools or diarrhea, hold the medications until you stool forms back up.  If your symptoms do not get better within 1 week or if they get worse, check with your doctor.  If you experience "the worst abdominal pain ever" or develop nausea or vomiting, please contact the office immediately for further recommendations for treatment. ° ° °ITCHING:  If you experience itching with your medications, try taking only a single pain pill, or even half a pain pill at a time.  You can also use Benadryl over the counter for itching or also to help with sleep.  ° °TED HOSE STOCKINGS:  Use stockings on both legs until for at least 2 weeks or as directed by physician office. They may be removed at night for sleeping. ° °MEDICATIONS:  See your medication summary on the “After Visit Summary” that nursing will review with you.  You may have some home medications which will be placed on hold until you complete the course of blood thinner medication.  It is important for you to complete the blood thinner medication as prescribed. ° °PRECAUTIONS:  If you experience chest pain or shortness of breath - call 911 immediately for transfer to the hospital emergency department.  ° °If you develop a fever greater that 101 F, purulent drainage from wound, increased redness or drainage from wound, foul  odor from the wound/dressing, or calf pain - CONTACT YOUR SURGEON.   °                                                °FOLLOW-UP APPOINTMENTS:  If you do not already have a post-op appointment, please call the office for an appointment to be seen by your surgeon.    Guidelines for how soon to be seen are listed in your “After Visit Summary”, but are typically between 1-4 weeks after surgery. ° °OTHER INSTRUCTIONS:  ° °Knee Replacement:  Do not place pillow under knee, focus on keeping the knee straight while resting.  ° °MAKE SURE YOU:  °• Understand these instructions.  °• Get help right away if you are not doing well or get worse.  ° ° °Thank you for letting us be a part of your medical care team.  It is a privilege we respect greatly.  We hope these instructions will help you stay on track for a fast and full recovery!  °  °

## 2015-02-08 NOTE — Progress Notes (Signed)
Patient ID: Philis FendtMark J Velarde, male   DOB: 01-03-1961, 54 y.o.   MRN: [id number] Subjective: 1 Day Post-Op Procedure(s) (LRB): LEFT TOTAL KNEE ARTHROPLASTY (Left)    Patient reports pain as mild.  Comfortably up in recliner this morning.  With knee flexed to 90 degrees and demonstrating active knee extension already  Objective:   VITALS:   Filed Vitals:   02/08/15 0524  BP: 120/76  Pulse: 72  Temp: 97.8 F (36.6 C)  Resp: 16    Neurovascular intact Incision: dressing C/D/I  LABS  Recent Labs  02/08/15 0500  HGB 12.8*  HCT 37.2*  WBC 14.7*  PLT 225     Recent Labs  02/08/15 0500  NA 138  K 3.6  BUN 22*  CREATININE 1.25*  GLUCOSE 164*    No results for input(s): LABPT, INR in the last 72 hours.   Assessment/Plan: 1 Day Post-Op Procedure(s) (LRB): LEFT TOTAL KNEE ARTHROPLASTY (Left)   Advance diet Up with therapy Discharge home with home health today after therapy  RTC in 2 weeks

## 2015-02-08 NOTE — Evaluation (Signed)
Physical Therapy Evaluation Patient Details Name: Garrett Hanson MRN: [id number] DOB: 1960-12-05 Today's Date: 02/08/2015   History of Present Illness  s/p L TKA  Clinical Impression  Pt admitted with above diagnosis. Pt currently with functional limitations due to the deficits listed below (see PT Problem List).  Pt will benefit from skilled PT to increase their independence and safety with mobility to allow discharge to the venue listed below.  Will see again later for stair training     Follow Up Recommendations Home health PT    Equipment Recommendations  Rolling walker with 5" wheels    Recommendations for Other Services       Precautions / Restrictions Precautions Precautions: Knee;Fall Restrictions Other Position/Activity Restrictions: WBAT      Mobility  Bed Mobility               General bed mobility comments: in chair  Transfers Overall transfer level: Needs assistance Equipment used: Rolling walker (2 wheeled) Transfers: Sit to/from Stand Sit to Stand: Supervision         General transfer comment: cues for safety adn hand placement  Ambulation/Gait Ambulation/Gait assistance: Supervision Ambulation Distance (Feet): 120 Feet Assistive device: Rolling walker (2 wheeled) Gait Pattern/deviations: Step-through pattern     General Gait Details: cues for RW safety during turns  Stairs            Wheelchair Mobility    Modified Rankin (Stroke Patients Only)       Balance                                             Pertinent Vitals/Pain Pain Assessment: 0-10 Pain Score: 6  Pain Location: L knee Pain Descriptors / Indicators: Aching;Sore Pain Intervention(s): Limited activity within patient's tolerance;Monitored during session;Premedicated before session    Home Living Family/patient expects to be discharged to:: Private residence Living Arrangements: Spouse/significant other   Type of Home: House Home  Access: Stairs to enter Entrance Stairs-Rails: None Entrance Stairs-Number of Steps: 3 Home Layout: Multi-level Home Equipment: Cane - single point;Crutches      Prior Function Level of Independence: Independent;Independent with assistive device(s)         Comments: cane priro to adm     Hand Dominance        Extremity/Trunk Assessment   Upper Extremity Assessment: Overall WFL for tasks assessed           Lower Extremity Assessment: LLE deficits/detail   LLE Deficits / Details: active knee flexion to ~90*; strength at least 3/5 knee ext adn flex     Communication   Communication: No difficulties  Cognition Arousal/Alertness: Awake/alert Behavior During Therapy: WFL for tasks assessed/performed Overall Cognitive Status: Within Functional Limits for tasks assessed                      General Comments      Exercises Total Joint Exercises Quad Sets: AROM;10 reps;Both Long Arc Quad: AROM;5 reps;Left Knee Flexion: AROM;5 reps;Left;Seated      Assessment/Plan    PT Assessment Patient needs continued PT services  PT Diagnosis Difficulty walking   PT Problem List Decreased strength;Decreased range of motion;Decreased mobility;Decreased knowledge of use of DME;Decreased knowledge of precautions  PT Treatment Interventions DME instruction;Gait training;Functional mobility training;Patient/family education;Therapeutic exercise;Stair training   PT Goals (Current goals can be found in the Care  Plan section) Acute Rehab PT Goals Patient Stated Goal: to be able to go upstairs PT Goal Formulation: With patient Time For Goal Achievement: 02/10/15 Potential to Achieve Goals: Good    Frequency 7X/week   Barriers to discharge        Co-evaluation               End of Session Equipment Utilized During Treatment: Gait belt Activity Tolerance: Patient tolerated treatment well Patient left: in chair;with call bell/phone within reach            Time: 0926-1000 PT Time Calculation (min) (ACUTE ONLY): 34 min   Charges:   PT Evaluation $Initial PT Evaluation Tier I: 1 Procedure PT Treatments $Gait Training: 8-22 mins   PT G Codes:        Nilza Eaker 02/08/2015, 10:02 AM

## 2015-02-08 NOTE — Discharge Summary (Signed)
Physician Discharge Summary  Patient ID: Garrett Hanson MRN: [id number] DOB/AGE: [birth year] 54 y.o.  Admit date: 02/07/2015 Discharge date:  02/08/2015  Procedures:  Procedure(s) (LRB): LEFT TOTAL KNEE ARTHROPLASTY (Left)  Attending Physician:  Dr. Durene RomansMatthew Olin   Admission Diagnoses:   Left knee primary OA / pain  Discharge Diagnoses:  Principal Problem:   S/P left TKA Active Problems:   S/P knee replacement  Past Medical History  Diagnosis Date  . Complication of anesthesia     pt states awaken during colonscopy   . Hypertension   . Dysrhythmia     hx of 10 years ago pt states was electrical currently no issues is currently not followed by cardiologist  . History of blood clots     arm during football injury   . Pneumonia     03/2014  . History of kidney stones   . Arthritis   . History of blood transfusion     at birth     HPI:    Garrett Hanson, 54 y.o. male, has a history of pain and functional disability in the left knee due to arthritis and has failed non-surgical conservative treatments for greater than 12 weeks to include NSAID's and/or analgesics, corticosteriod injections, use of assistive devices and activity modification. Onset of symptoms was gradual, starting 3+ years ago with gradually worsening course since that time. The patient noted prior procedures on the knee to include arthroscopy and menisectomy on the left knee(s). Patient currently rates pain in the left knee(s) at 10 out of 10 with activity. Patient has night pain, worsening of pain with activity and weight bearing, pain that interferes with activities of daily living, pain with passive range of motion, crepitus and joint swelling. Patient has evidence of periarticular osteophytes and joint space narrowing by imaging studies. There is no active infection. Risks, benefits and expectations were discussed with the patient. Risks including but not limited to the risk of anesthesia, blood  clots, nerve damage, blood vessel damage, failure of the prosthesis, infection and up to and including death. Patient understand the risks, benefits and expectations and wishes to proceed with surgery.   PCP: Donovan KailOSS,ALLeN, MD   Discharged Condition: good  Hospital Course:  Patient underwent the above stated procedure on 02/07/2015. Patient tolerated the procedure well and brought to the recovery room in good condition and subsequently to the floor.  POD #1 BP: 120/76 ; Pulse: 72 ; Temp: 97.8 F (36.6 C) ; Resp: 16 Patient reports pain as mild. Comfortably up in recliner this morning. With knee flexed to 90 degrees and demonstrating active knee extension already.  Ready to be discharged home. Dorsiflexion/plantar flexion intact, incision: dressing C/D/I, no cellulitis present and compartment soft.   LABS  Basename    HGB  12.8  HCT  37.2    Discharge Exam: General appearance: alert, cooperative and no distress Extremities: Homans sign is negative, no sign of DVT, no edema, redness or tenderness in the calves or thighs and no ulcers, gangrene or trophic changes  Disposition:  Home with follow up in 2 weeks   Follow-up Information    Follow up with Shelda PalLIN,Christelle Igoe D, MD.   Specialty:  Orthopedic Surgery   Contact information:   [address] [place] [address] [phone]       Follow up with [facility].   Specialty:  Home Health Services   Why:  home healthphysical therapy   Contact information:   [address]  [address] [place] [address] [phone]       Discharge Instructions    Call MD / Call 911    Complete by:  As directed   If you experience chest pain or shortness of breath, CALL 911 and be transported to the hospital emergency room.  If you develope a fever above 101 F, pus (white drainage) or increased drainage or redness at the wound, or calf pain, call your surgeon's office.     Change dressing    Complete by:  As  directed   Maintain surgical dressing until follow up in the clinic. If the edges start to pull up, may reinforce with tape. If the dressing is no longer working, may remove and cover with gauze and tape, but must keep the area dry and clean.  Call with any questions or concerns.     Constipation Prevention    Complete by:  As directed   Drink plenty of fluids.  Prune juice may be helpful.  You may use a stool softener, such as Colace (over the counter) 100 mg twice a day.  Use MiraLax (over the counter) for constipation as needed.     Diet - low sodium heart healthy    Complete by:  As directed      Discharge instructions    Complete by:  As directed   Maintain surgical dressing until follow up in the clinic. If the edges start to pull up, may reinforce with tape. If the dressing is no longer working, may remove and cover with gauze and tape, but must keep the area dry and clean.  Follow up in 2 weeks at [facility]. Call with any questions or concerns.     Increase activity slowly as tolerated    Complete by:  As directed   Weight bearing as tolerated with assist device (walker, cane, etc) as directed, use it as long as suggested by your surgeon or therapist, typically at least 4-6 weeks.     TED hose    Complete by:  As directed   Use stockings (TED hose) for 2 weeks on both leg(s).  You may remove them at night for sleeping.             Medication List    TAKE these medications        atorvastatin 40 MG tablet  Commonly known as:  LIPITOR  Take 40 mg by mouth every evening.     celecoxib 200 MG capsule  Commonly known as:  CELEBREX  Take 200 mg by mouth every morning.     docusate sodium 100 MG capsule  Commonly known as:  COLACE  Take 1 capsule (100 mg total) by mouth 2 (two) times daily.     famotidine 40 MG tablet  Commonly known as:  PEPCID  Take 40 mg by mouth 2 (two) times daily.     ferrous sulfate 325 (65 FE) MG tablet  Take 1 tablet (325 mg total)  by mouth 3 (three) times daily after meals.     fluticasone 27.5 MCG/SPRAY nasal spray  Commonly known as:  VERAMYST  Place 1 spray into the nose every morning.     hydrochlorothiazide 25 MG tablet  Commonly known as:  HYDRODIURIL  Take 25 mg by mouth daily.     HYDROcodone-acetaminophen 7.5-325 MG tablet  Commonly known as:  NORCO  Take 1-2 tablets by mouth every 4 (four) hours as needed for moderate pain.     HYDROmorphone 2  MG tablet  Commonly known as:  DILAUDID  Take 1-2 tablets (2-4 mg total) by mouth every 4 (four) hours as needed for severe pain (breakthrough pain).     lactobacillus acidophilus Tabs tablet  Take 1 tablet by mouth daily.     losartan 100 MG tablet  Commonly known as:  COZAAR  Take 100 mg by mouth every morning.     methocarbamol 500 MG tablet  Commonly known as:  ROBAXIN  Take 1 tablet (500 mg total) by mouth every 6 (six) hours as needed for muscle spasms.     metoprolol tartrate 25 MG tablet  Commonly known as:  LOPRESSOR  Take 25 mg by mouth 2 (two) times daily.     polyethylene glycol packet  Commonly known as:  MIRALAX / GLYCOLAX  Take 17 g by mouth 2 (two) times daily.     rivaroxaban 10 MG Tabs tablet  Commonly known as:  XARELTO  Take 1 tablet (10 mg total) by mouth daily.         Signed: Anastasio Auerbach. Delano Frate   PA-C  02/08/2015, 1:21 PM

## 2015-02-08 NOTE — Evaluation (Signed)
Occupational Therapy Evaluation Patient Details Name: Garrett FendtMark J Sherrod MRN: [id number] DOB: Aug 18, 1960 Today's Date: 02/08/2015    History of Present Illness s/p L TKA   Clinical Impression   This 54 year old man was admitted for the above surgery. All education was completed.  No further OT is needed at this time.      Follow Up Recommendations  No OT follow up;Supervision/Assistance - 24 hour    Equipment Recommendations    None by OT.  Pt plans to get a shower seat.  He doesn't want DME for toilet   Recommendations for Other Services       Precautions / Restrictions Precautions Precautions: Knee;Fall Restrictions Weight Bearing Restrictions: No Other Position/Activity Restrictions: WBAT      Mobility Bed Mobility               General bed mobility comments: oob  Transfers Overall transfer level: Needs assistance Equipment used: Rolling walker (2 wheeled) Transfers: Sit to/from Stand Sit to Stand: Supervision         General transfer comment: cues for UE placement and safety    Balance                                            ADL Overall ADL's : Needs assistance/impaired     Grooming: Supervision/safety;Standing   Upper Body Bathing: Set up;Sitting   Lower Body Bathing: Minimal assistance;Sit to/from stand   Upper Body Dressing : Set up;Sitting   Lower Body Dressing: Moderate assistance;Sit to/from stand   Toilet Transfer: Min guard;Ambulation;Comfort height toilet   Toileting- Clothing Manipulation and Hygiene: Min guard;Sit to/from stand   Tub/ Shower Transfer: Min guard;Ambulation;Walk-in shower     General ADL Comments: pt ambulated to bathroom, practiced commode and shower transfer and donned shorts.  Pt does not want a 3:1 for home.  He was able to get up from comfort height commode with min guard for safety.  He has a comfort height downstairs and bathroom upstairs that is not master has a sink next to  commode.  Educated on sequence for dressing.  Pt motivated     Advice workerVision     Perception     Praxis      Pertinent Vitals/Pain Pain Assessment: Faces Pain Score: 6  Faces Pain Scale: Hurts even more Pain Location: R knee Pain Descriptors / Indicators: Aching Pain Intervention(s): Limited activity within patient's tolerance;Monitored during session;Premedicated before session;Repositioned;Ice applied     Hand Dominance     Extremity/Trunk Assessment Upper Extremity Assessment Upper Extremity Assessment: Overall WFL for tasks assessed          Communication Communication Communication: No difficulties   Cognition Arousal/Alertness: Awake/alert Behavior During Therapy: WFL for tasks assessed/performed Overall Cognitive Status: Within Functional Limits for tasks assessed                     General Comments       Exercises       Shoulder Instructions      Home Living Family/patient expects to be discharged to:: Private residence Living Arrangements: Spouse/significant other   Type of Home: House Home Access: Stairs to enter Secretary/administratorntrance Stairs-Number of Steps: 3 Entrance Stairs-Rails: None Home Layout: Multi-level Alternate Level Stairs-Number of Steps: 12+ Alternate Level Stairs-Rails: Right Bathroom Shower/Tub: Walk-in shower;Door   Allied Waste IndustriesBathroom Toilet:  (has high downstairs; others slightly higher)  Home Equipment: Cane - single point;Crutches          Prior Functioning/Environment Level of Independence: Independent;Independent with assistive device(s)        Comments: cane priro to adm    OT Diagnosis: Acute pain   OT Problem List:     OT Treatment/Interventions:      OT Goals(Current goals can be found in the care plan section) Acute Rehab OT Goals Patient Stated Goal: to be able to go upstairs  OT Frequency:     Barriers to D/C:            Co-evaluation              End of Session    Activity Tolerance: Patient  tolerated treatment well Patient left: in chair;with call bell/phone within reach   Time: 1039-1058 OT Time Calculation (min): 19 min Charges:  OT General Charges $OT Visit: 1 Procedure OT Evaluation $Initial OT Evaluation Tier I: 1 Procedure G-Codes:    Negin Hegg 2015-03-03, 11:16 AM Marica Otter, OTR/L [phone] Mar 03, 2015

## 2015-02-08 NOTE — Progress Notes (Signed)
Physical Therapy Treatment Patient Details Name: Garrett FendtMark J Hanson MRN: [id number] DOB: 1960-07-29 Today's Date: 02/08/2015    History of Present Illness s/p L TKA    PT Comments    POD # 1 pm session.  With spouse practiced stairs.  Instructed on safety with walker during gait.  Instructed on HEP freq and use of ICE.  Instructed on proper positioning L LE in extension.  Instructed no pillow under knee and no kneeling. Pt ready for D/C to home.   Follow Up Recommendations  Home health PT     Equipment Recommendations  Rolling walker with 5" wheels    Recommendations for Other Services       Precautions / Restrictions Precautions Precautions: Knee;Fall Precaution Comments: instructed no pillow under knee and no kneeling Restrictions Weight Bearing Restrictions: No Other Position/Activity Restrictions: WBAT    Mobility  Bed Mobility               General bed mobility comments: OOB in recliner  Transfers Overall transfer level: Needs assistance Equipment used: Rolling walker (2 wheeled) Transfers: Sit to/from Stand Sit to Stand: Supervision         General transfer comment: increased time.  Good use of hands  Ambulation/Gait Ambulation/Gait assistance: Supervision Ambulation Distance (Feet): 185 Feet Assistive device: Rolling walker (2 wheeled) Gait Pattern/deviations: Step-through pattern Gait velocity: WFL   General Gait Details: cues for RW safety during turns   Stairs Stairs: Yes Stairs assistance: Min guard Stair Management: One rail Right;With cane Number of Stairs: 4 General stair comments: with spouse.  Instructed on proper sequencing and safety  Wheelchair Mobility    Modified Rankin (Stroke Patients Only)       Balance                                    Cognition Arousal/Alertness: Awake/alert Behavior During Therapy: WFL for tasks assessed/performed Overall Cognitive Status: Within Functional Limits for tasks  assessed                      Exercises      General Comments        Pertinent Vitals/Pain Pain Assessment: 0-10 Pain Score: 5  Pain Location: R knee Pain Descriptors / Indicators: Burning Pain Intervention(s): Monitored during session;Repositioned;Ice applied    Home Living                      Prior Function            PT Goals (current goals can now be found in the care plan section) Progress towards PT goals: Progressing toward goals    Frequency  7X/week    PT Plan      Co-evaluation             End of Session Equipment Utilized During Treatment: Gait belt Activity Tolerance: Patient tolerated treatment well Patient left: in chair;with call bell/phone within reach     Time: 1345-1400 PT Time Calculation (min) (ACUTE ONLY): 15 min  Charges:  $Gait Training: 8-22 mins                    G Codes:      Felecia ShellingLori Elizah Lydon  PTA [facility]  Acute  Rehab Pager      [phone]

## 2015-02-08 NOTE — Care Management Note (Addendum)
Case Management Note  Patient Details  Name: Garrett Hanson MRN: [id number] Date of Birth: 01/18/1961  Subjective/Objective:                   LEFT TOTAL KNEE ARTHROPLASTY (Left) Action/Plan: Discharge planning  Expected Discharge Date:  02/08/15               Expected Discharge Plan:  [place]  In-House Referral:     Discharge planning Services  CM Consult  Post Acute Care Choice:  Home Health Choice offered to:  Patient  DME Arranged:  N/A DME Agency:  NA  HH Arranged:  PT HH Agency:  [facility]  Status of Service:  Completed, signed off  Medicare Important Message Given:    Date Medicare IM Given:    Medicare IM give by:    Date Additional Medicare IM Given:    Additional Medicare Important Message give by:     If discussed at New Point of Stay Meetings, dates discussed:    Additional Comments: Utilization Review complete. CM met with pt in room to offer choice; choices are limited bc of [employer] and pt agrees to [facility].  Pt states he has a rolling walker and elevated commode and does not need any DME.  Referral called to [facility] rep, Judson Roch who accepts referral and requests I fax facesheet, order, F2F, H&P, OP note, Progress note and PT EVAL note to [phone].  Cm faxed requested information and received confirmation of receipt.  No other CM needs were communicated. Dellie Catholic, RN 02/08/2015, 1:18 PM

## 2015-03-08 ENCOUNTER — Other Ambulatory Visit (HOSPITAL_COMMUNITY): Payer: Self-pay | Admitting: Orthopedic Surgery

## 2015-03-08 ENCOUNTER — Ambulatory Visit (HOSPITAL_COMMUNITY)
Admission: RE | Admit: 2015-03-08 | Discharge: 2015-03-08 | Disposition: A | Discharge status: Home / Self Care | Payer: Managed Care, Other (non HMO) | Source: Ambulatory Visit | Attending: Cardiovascular Disease | Admitting: Cardiovascular Disease

## 2015-03-08 DIAGNOSIS — M79605 Pain in left leg: Secondary | ICD-10-CM

## 2015-03-08 DIAGNOSIS — M7989 Other specified soft tissue disorders: Secondary | ICD-10-CM | POA: Diagnosis not present

## 2015-03-08 DIAGNOSIS — I10 Essential (primary) hypertension: Secondary | ICD-10-CM | POA: Insufficient documentation

## 2015-03-11 ENCOUNTER — Encounter (HOSPITAL_COMMUNITY): Payer: Managed Care, Other (non HMO)

## 2016-03-12 ENCOUNTER — Other Ambulatory Visit: Payer: Self-pay | Admitting: Family Medicine

## 2016-03-12 DIAGNOSIS — R1031 Right lower quadrant pain: Secondary | ICD-10-CM

## 2016-03-14 ENCOUNTER — Ambulatory Visit
Admission: RE | Admit: 2016-03-14 | Discharge: 2016-03-14 | Disposition: A | Discharge status: Home / Self Care | Payer: Managed Care, Other (non HMO) | Source: Ambulatory Visit | Attending: Family Medicine | Admitting: Family Medicine

## 2016-03-14 ENCOUNTER — Other Ambulatory Visit: Payer: Self-pay | Admitting: Family Medicine

## 2016-03-14 DIAGNOSIS — R1031 Right lower quadrant pain: Secondary | ICD-10-CM

## 2016-04-05 ENCOUNTER — Other Ambulatory Visit (HOSPITAL_COMMUNITY): Payer: Self-pay | Admitting: Family Medicine

## 2016-04-05 DIAGNOSIS — R590 Localized enlarged lymph nodes: Secondary | ICD-10-CM

## 2016-04-11 ENCOUNTER — Encounter: Payer: Self-pay | Admitting: Hematology and Oncology

## 2016-04-11 ENCOUNTER — Telehealth: Payer: Self-pay | Admitting: Hematology and Oncology

## 2016-04-11 NOTE — Telephone Encounter (Signed)
Appt scheduled w/Gudena on 1/2 @345pm . Pt agreed to appt date and time. Demographics verified. Pt's insurance will change in the beginning of the year. He's aware to bring it to registration.

## 2016-04-24 ENCOUNTER — Ambulatory Visit (HOSPITAL_BASED_OUTPATIENT_CLINIC_OR_DEPARTMENT_OTHER): Payer: BLUE CROSS/BLUE SHIELD | Admitting: Hematology and Oncology

## 2016-04-24 DIAGNOSIS — R5383 Other fatigue: Secondary | ICD-10-CM

## 2016-04-24 DIAGNOSIS — R590 Localized enlarged lymph nodes: Secondary | ICD-10-CM

## 2016-04-24 MED ORDER — LEVOFLOXACIN 750 MG PO TABS: 750.0000 mg | ORAL_TABLET | Freq: Every day | ORAL | 0 refills | Status: DC

## 2016-04-25 ENCOUNTER — Encounter: Payer: Self-pay | Admitting: Hematology and Oncology

## 2016-04-25 DIAGNOSIS — R590 Localized enlarged lymph nodes: Secondary | ICD-10-CM | POA: Insufficient documentation

## 2016-04-25 NOTE — Assessment & Plan Note (Signed)
Para-aortic and right iliac lymphadenopathy: Largest measured 1.2 cm I reviewed the CT scan of the patient and reviewed the lymph nodes that were enlarged. These lymph nodes have not markedly increased in size compared to 2009. We discussed the differential diagnosis of lymphadenopathy. This differential includes 1. Reactive causes related to underlying inflammation/infection: Patient has recurrent diarrhea and possibly has irritable bowel disease. He also has had recurrent kidney stones. Either of these causes could cause reactive lymphadenopathy. 2. other causes including CLL/lymphoma is were also discussed. He does not have any increase in the lymphocyte count and his differential on the CBC was normal therefore is unlikely to be CLL. We cannot rule out other benign indolent lymphoma is without a tissue diagnosis. These lymph nodes in the abdominal area are relatively small and are not amenable for CT-guided biopsy. There are no other easily biopsy able clinically enlarged lymph nodes in his neck or axilla or groin. It is for this reason I did not recommend any biopsies.  Severe fatigue: Worsening over the past 1 year. He has gained significant weight in this timeframe. I discussed with him that I agree with Dr. Tenny Crawoss who had discussed with him that he needs a sleep study to evaluate for obstructive sleep apnea. He does have mild elevation of serum creatinine however he does not have any history of diabetes. His TSH was normal as well.  Recommendation: Please obtain a CT chest abdomen pelvis in 2 years for follow-up of lymphadenopathy. If it remains stable then no further CT scans are necessary.  Return to clinic if there are any additional suspicious findings for lymphoma or the CT scan shows any abnormalities that suggest progression of lymphadenopathy.

## 2016-04-25 NOTE — Progress Notes (Signed)
[facility] CONSULT NOTE  Patient Care Team: Daisy Floro, MD as PCP - General  CHIEF COMPLAINTS/PURPOSE OF CONSULTATION:  Enlarged abdominal lymph nodes, fatigue  HISTORY OF PRESENTING ILLNESS:  Garrett Hanson 56 y.o. male is here because of recent diagnosis of enlarged aortic and right iliac chain lymph nodes largest measuring 1.2 cm. Patient has had recent problems with right groin pain that is quite intermittent. He also has severe fatigue for the past 1 year. Because of the symptoms he underwent a CT of his abdomen and pelvis. He did have arthritis changes his hip joint. In addition to that he was found to have a aortic and right iliac lymph node measuring 1.2 cm in maximum dimension. He was sent to [place] for discussion regarding the lymphadenopathy and his complaint of worsening fatigue. He has had left knee replacement surgery. His wife remarks that he may be walking differently since his surgery and that may be putting pressure in his right hip.  I reviewed her records extensively and collaborated the history with the patient.  MEDICAL HISTORY:  Past Medical History:  Diagnosis Date  . Arthritis   . Complication of anesthesia    pt states awaken during colonscopy   . Dysrhythmia    hx of 10 years ago pt states was electrical currently no issues is currently not followed by cardiologist  . History of blood clots    arm during football injury   . History of blood transfusion    at birth   . History of kidney stones   . Hypertension   . Pneumonia    03/2014    SURGICAL HISTORY: Past Surgical History:  Procedure Laterality Date  . crevical disc surgery      approx 7 years ago   . HERNIA REPAIR     left inguinal 1980  . LITHOTRIPSY    . right elbow surgery     . SHOULDER SURGERY     right approx 8 years ago times 2  . TOTAL KNEE ARTHROPLASTY Left 02/07/2015   Procedure: LEFT TOTAL KNEE ARTHROPLASTY;  Surgeon: Durene Romans, MD;  Location: [facility];   Service: Orthopedics;  Laterality: Left;    SOCIAL HISTORY: Patient works at [employer] History   Social History  . Marital status: Married    Spouse name: N/A  . Number of children: N/A  . Years of education: N/A   Occupational History  . Not on file.   Social History Main Topics  . Smoking status: Never Smoker  . Smokeless tobacco: Never Used  . Alcohol use Yes     Comment: occas   . Drug use: No  . Sexual activity: Not on file   Other Topics Concern  . Not on file   Social History Narrative  . No narrative on file    FAMILY HISTORY: No family history on file.  ALLERGIES:  is allergic to penicillins; singulair [montelukast sodium]; and contrast media [iodinated diagnostic agents].  MEDICATIONS:  Current Outpatient Prescriptions  Medication Sig Dispense Refill  . atorvastatin (LIPITOR) 40 MG tablet Take 40 mg by mouth every evening.    . celecoxib (CELEBREX) 200 MG capsule Take 200 mg by mouth every morning.    . docusate sodium (COLACE) 100 MG capsule Take 1 capsule (100 mg total) by mouth 2 (two) times daily. 10 capsule 0  . famotidine (PEPCID) 40 MG tablet Take 40 mg by mouth 2 (two) times daily.    . ferrous sulfate  325 (65 FE) MG tablet Take 1 tablet (325 mg total) by mouth 3 (three) times daily after meals.  3  . fluticasone (VERAMYST) 27.5 MCG/SPRAY nasal spray Place 1 spray into the nose every morning.    . hydrochlorothiazide (HYDRODIURIL) 25 MG tablet Take 25 mg by mouth daily.    Marland Kitchen HYDROcodone-acetaminophen (NORCO) 7.5-325 MG tablet Take 1-2 tablets by mouth every 4 (four) hours as needed for moderate pain. 100 tablet 0  . HYDROmorphone (DILAUDID) 2 MG tablet Take 1-2 tablets (2-4 mg total) by mouth every 4 (four) hours as needed for severe pain (breakthrough pain). 20 tablet 0  . lactobacillus acidophilus (BACID) TABS tablet Take 1 tablet by mouth daily.    Marland Kitchen levofloxacin (LEVAQUIN) 750 MG tablet Take 1 tablet (750 mg total) by mouth daily. 7 tablet 0   . losartan (COZAAR) 100 MG tablet Take 100 mg by mouth every morning.    . methocarbamol (ROBAXIN) 500 MG tablet Take 1 tablet (500 mg total) by mouth every 6 (six) hours as needed for muscle spasms. 50 tablet 0  . metoprolol tartrate (LOPRESSOR) 25 MG tablet Take 25 mg by mouth 2 (two) times daily.    . polyethylene glycol (MIRALAX / GLYCOLAX) packet Take 17 g by mouth 2 (two) times daily. 14 each 0  . rivaroxaban (XARELTO) 10 MG TABS tablet Take 1 tablet (10 mg total) by mouth daily. 14 tablet 0   No current facility-administered medications for this visit.     REVIEW OF SYSTEMS:   Constitutional: Denies fevers, chills or abnormal night sweats, Complains of profound fatigue for the past 1 year Eyes: Denies blurriness of vision, double vision or watery eyes Ears, nose, mouth, throat, and face: Denies mucositis or sore throat Respiratory: Denies cough, dyspnea or wheezes Cardiovascular: Denies palpitation, chest discomfort or lower extremity swelling Gastrointestinal:  Denies nausea, heartburn or change in bowel habits Skin: Denies abnormal skin rashes Lymphatics: Denies new lymphadenopathy or easy bruising Neurological:Denies numbness, tingling or new weaknesses Behavioral/Psych: Mood is stable, no new changes  Extremities: Intermittent right hip pain All other systems were reviewed with the patient and are negative.  PHYSICAL EXAMINATION: ECOG PERFORMANCE STATUS: 1 - Symptomatic but completely ambulatory  Vitals:   04/24/16 1548  BP: 137/89  Pulse: 82  Resp: 18  Temp: 98.1 F (36.7 C)   Filed Weights   04/24/16 1548  Weight: 294 lb 14.4 oz (133.8 kg)    GENERAL:alert, no distress and comfortable SKIN: skin color, texture, turgor are normal, no rashes or significant lesions EYES: normal, conjunctiva are pink and non-injected, sclera clear OROPHARYNX:no exudate, no erythema and lips, buccal mucosa, and tongue normal  NECK: supple, thyroid normal size, non-tender, without  nodularity LYMPH:  no palpable lymphadenopathy in the cervical, axillary or inguinal LUNGS: clear to auscultation and percussion with normal breathing effort HEART: regular rate & rhythm and no murmurs and no lower extremity edema ABDOMEN:abdomen soft, non-tender and normal bowel sounds Musculoskeletal:no cyanosis of digits and no clubbing  PSYCH: alert & oriented x 3 with fluent speech NEURO: no focal motor/sensory deficits LABORATORY DATA:  I have reviewed the data as listed Lab Results  Component Value Date   WBC 14.7 (H) 02/08/2015   HGB 12.8 (L) 02/08/2015   HCT 37.2 (L) 02/08/2015   MCV 84.7 02/08/2015   PLT 225 02/08/2015   Lab Results  Component Value Date   NA 138 02/08/2015   K 3.6 02/08/2015   CL 100 (L) 02/08/2015   CO2  28 02/08/2015    RADIOGRAPHIC STUDIES: I have personally reviewed the radiological reports and agreed with the findings in the report.  ASSESSMENT AND PLAN:  Lymphadenopathy, abdominal Para-aortic and right iliac lymphadenopathy: Largest measured 1.2 cm I reviewed the CT scan of the patient and reviewed the lymph nodes that were enlarged. These lymph nodes have not markedly increased in size compared to 2009. We discussed the differential diagnosis of lymphadenopathy. This differential includes 1. Reactive causes related to underlying inflammation/infection: Patient has recurrent diarrhea and possibly has irritable bowel disease. He also has had recurrent kidney stones. Either of these causes could cause reactive lymphadenopathy. 2. other causes including CLL/lymphoma is were also discussed. He does not have any increase in the lymphocyte count and his differential on the CBC was normal therefore is unlikely to be CLL. We cannot rule out other benign indolent lymphoma is without a tissue diagnosis. These lymph nodes in the abdominal area are relatively small and are not amenable for CT-guided biopsy. There are no other easily biopsy able clinically  enlarged lymph nodes in his neck or axilla or groin. It is for this reason I did not recommend any biopsies.  Severe fatigue: Worsening over the past 1 year. He has gained significant weight in this timeframe. I discussed with him that I agree with Dr. Tenny Crawoss who had discussed with him that he needs a sleep study to evaluate for obstructive sleep apnea. He does have mild elevation of serum creatinine however he does not have any history of diabetes. His TSH was normal as well.  Recommendation: Please obtain a CT chest abdomen pelvis in 2 years for follow-up of lymphadenopathy. If it remains stable then no further CT scans are necessary.  Return to clinic if there are any additional suspicious findings for lymphoma or the CT scan shows any abnormalities that suggest progression of lymphadenopathy.    All questions were answered. The patient knows to call the clinic with any problems, questions or concerns.    Sabas SousGudena, Keegan Bensch K, MD 04/25/16

## 2016-08-27 DIAGNOSIS — M25551 Pain in right hip: Secondary | ICD-10-CM | POA: Diagnosis not present

## 2016-08-27 DIAGNOSIS — M160 Bilateral primary osteoarthritis of hip: Secondary | ICD-10-CM | POA: Diagnosis not present

## 2016-08-27 DIAGNOSIS — M25552 Pain in left hip: Secondary | ICD-10-CM | POA: Diagnosis not present

## 2016-08-30 DIAGNOSIS — M1612 Unilateral primary osteoarthritis, left hip: Secondary | ICD-10-CM | POA: Diagnosis not present

## 2016-09-14 DIAGNOSIS — R21 Rash and other nonspecific skin eruption: Secondary | ICD-10-CM | POA: Diagnosis not present

## 2016-10-04 DIAGNOSIS — G4719 Other hypersomnia: Secondary | ICD-10-CM | POA: Diagnosis not present

## 2016-10-04 DIAGNOSIS — R0681 Apnea, not elsewhere classified: Secondary | ICD-10-CM | POA: Diagnosis not present

## 2016-10-28 DIAGNOSIS — G4733 Obstructive sleep apnea (adult) (pediatric): Secondary | ICD-10-CM | POA: Diagnosis not present

## 2016-10-29 DIAGNOSIS — G4733 Obstructive sleep apnea (adult) (pediatric): Secondary | ICD-10-CM | POA: Diagnosis not present

## 2016-11-12 DIAGNOSIS — I10 Essential (primary) hypertension: Secondary | ICD-10-CM | POA: Diagnosis not present

## 2016-11-12 DIAGNOSIS — R21 Rash and other nonspecific skin eruption: Secondary | ICD-10-CM | POA: Diagnosis not present

## 2016-11-12 DIAGNOSIS — E7800 Pure hypercholesterolemia, unspecified: Secondary | ICD-10-CM | POA: Diagnosis not present

## 2016-11-12 DIAGNOSIS — I4891 Unspecified atrial fibrillation: Secondary | ICD-10-CM | POA: Diagnosis not present

## 2016-11-12 DIAGNOSIS — Z0000 Encounter for general adult medical examination without abnormal findings: Secondary | ICD-10-CM | POA: Diagnosis not present

## 2016-11-14 ENCOUNTER — Telehealth: Payer: Self-pay

## 2016-11-14 NOTE — Telephone Encounter (Signed)
Sent notes to scheduling 

## 2016-11-15 DIAGNOSIS — G4733 Obstructive sleep apnea (adult) (pediatric): Secondary | ICD-10-CM | POA: Diagnosis not present

## 2016-12-04 DIAGNOSIS — R109 Unspecified abdominal pain: Secondary | ICD-10-CM | POA: Diagnosis not present

## 2016-12-14 ENCOUNTER — Encounter: Payer: Self-pay | Admitting: Physician Assistant

## 2016-12-14 NOTE — Progress Notes (Signed)
Cardiology Office Note    Date:  12/17/2016  ID:  Garrett Hanson, DOB 06/01/1960, MRN [id number] PCP:  Garrett Floro, MD  Cardiologist:  Garrett Hanson, reviewed with Dr. Excell Hanson   Chief Complaint: evaluate atrial fib  History of Present Illness:  Garrett Hanson is a 56 y.o. male with history of kidney stones, PNA, arthritis, para-aortic/right iliac lymphadenopathy (surveillance by 04/2016 heme-onc note), probable CKD stage II who is being seen today for the evaluation of atrial fib at the request of Dr. Daisy Hanson. He reports a remote history of a tachy-arrhythmia, details unclear. 20 years ago he had a few ER visits in which he describes HR 150-250 when they were "standing over him" ready with paddles but told him they were giving him a certain amount of time to convert on his own before they would assist him. He does not recall this ever being given a name but thinks it might have been atrial fib. He reports being seen by someone at [facility] and had "cardiomapping" but that nothing else needed to be done. He states over the last 2 decades this has happened a few times a year, but overall short lived and not requiring any regular medical care. Episodes used to be related to stress, sleep deprivation and excess caffeine use.  He reports recently dx OSA which was found after he was reporting chronic fatigue, and began wearing CPAP about 3 weeks ago. He recently saw his PCP in July 23rd, 2018 at which time he was found to be in atrial fibrillation with a HR of 76bpm. He was unaware of this whatsoever. He remains fairly active and has not had any functional limitation recently - he denies any CP or SOB. He went hiking this year in [place] several months ago and denied any angina. He did have some issues with hearing loss [place] but he states he and his PCP thought this may be due to lower BP. His PCP started him on baby aspirin and referred him to cardiology. Labs from  10/2016 showed Tchol 130, HDL 46, LDL 61, Na 139, K 3.7, CO2 32, BUN 17, Cr 1.10 (c/w CKD II), Tprot 7.1, albumin 4.2, TBili 1.4, AST 15, ALT 20, ALP 53, PSA 1.24, glucose 95. No recent CBC or TSH. Today in clinic BP is 146/82 and HR 89->97 but he did not yet take his HCTZ, losartan, or metoprolol yet today. He drinks 1 glass of wine a week, denies illicit EtOH, drinks about 16oz of coffee daily.  Past Medical History:  Diagnosis Date  . Arthritis   . Chronic urticaria   . CKD (chronic kidney disease), stage II   . Complication of anesthesia    pt states awaken during colonscopy   . Essential hypertension   . History of kidney stones   . Lymphadenopathy    a. evaluated by heme-onc 04/2016, under surveillance.  . OSA (obstructive sleep apnea)   . Pure hypercholesterolemia     Past Surgical History:  Procedure Laterality Date  . crevical disc surgery      approx 7 years ago   . HERNIA REPAIR     left inguinal 1980  . LITHOTRIPSY    . right elbow surgery     . SHOULDER SURGERY     right approx 8 years ago times 2  . TOTAL KNEE ARTHROPLASTY Left 02/07/2015   Procedure: LEFT TOTAL KNEE ARTHROPLASTY;  Surgeon: Garrett Romans, MD;  Location: [facility];  Service: Orthopedics;  Laterality: Left;    Current Medications: Current Meds  Medication Sig  . atorvastatin (LIPITOR) 40 MG tablet Take 40 mg by mouth every evening.  . famotidine (PEPCID) 40 MG tablet Take 40 mg by mouth 2 (two) times daily.  . fluticasone (VERAMYST) 27.5 MCG/SPRAY nasal spray Place 1 spray into the nose every morning.  . hydrochlorothiazide (HYDRODIURIL) 25 MG tablet Take 25 mg by mouth daily.  Marland Kitchen HYDROcodone-acetaminophen (NORCO) 7.5-325 MG tablet Take 1-2 tablets by mouth every 4 (four) hours as needed for moderate pain.  Marland Kitchen HYDROmorphone (DILAUDID) 2 MG tablet Take 1-2 tablets (2-4 mg total) by mouth every 4 (four) hours as needed for severe pain (breakthrough pain).  Marland Kitchen lactobacillus acidophilus (BACID) TABS tablet Take  1 tablet by mouth daily.  Marland Kitchen losartan (COZAAR) 100 MG tablet Take 100 mg by mouth every morning.  . methocarbamol (ROBAXIN) 500 MG tablet Take 1 tablet (500 mg total) by mouth every 6 (six) hours as needed for muscle spasms.  . metoprolol tartrate (LOPRESSOR) 25 MG tablet Take 25 mg by mouth 2 (two) times daily.     Allergies:   Contrast media [iodinated diagnostic agents]; Penicillins; Ace inhibitors; and Singulair [montelukast sodium]   Social History   Social History  . Marital status: Married    Spouse name: N/A  . Number of children: N/A  . Years of education: N/A   Social History Main Topics  . Smoking status: Never Smoker  . Smokeless tobacco: Never Used  . Alcohol use Yes     Comment: occasionally 1-2x per week  . Drug use: No  . Sexual activity: Not Asked   Other Topics Concern  . None   Social History Narrative  . None     Family History:  Family History  Problem Relation Age of Onset  . Hypertension Mother   . Hyperlipidemia Mother   . Breast cancer Mother   . Uterine cancer Mother   . Sleep apnea Mother   . Hypertension Brother   . Hyperlipidemia Brother   . Cancer Brother     ROS:   Please see the history of present illness.  All other systems are reviewed and otherwise negative.    PHYSICAL EXAM:   VS:  BP (!) 146/82   Pulse 89   Ht 6\' 2"  (1.88 m)   Wt 295 lb 1.9 oz (133.9 kg)   SpO2 96%   BMI 37.89 kg/m   BMI: Body mass index is 37.89 kg/m. GEN: Well nourished, well developed obese WM, in no acute distress  HEENT: normocephalic, atraumatic Neck: no JVD, carotid bruits, or masses Cardiac: irregularly irregular rhythm; no murmurs, rubs, or gallops, trace BLE with marked varicose veins Respiratory:  clear to auscultation bilaterally, normal work of breathing GI: soft, nontender, nondistended, + BS MS: no deformity or atrophy  Skin: warm and dry, no rash Neuro:  Alert and Oriented x 3, Strength and sensation are intact, follows  commands Psych: euthymic mood, full affect  Wt Readings from Last 3 Encounters:  12/17/16 295 lb 1.9 oz (133.9 kg)  04/24/16 294 lb 14.4 oz (133.8 kg)  02/07/15 292 lb 8 oz (132.7 kg)      Studies/Labs Reviewed:   EKG:  EKG was ordered today and personally reviewed by me and demonstrates atrial fib 97bpm, nonspecific TW changes III, avF   Recent Labs: No results found for requested labs within last 8760 hours.   Lipid Panel No results found  for: CHOL, TRIG, HDL, CHOLHDL, VLDL, LDLCALC, LDLDIRECT  Additional studies/ records that were reviewed today include: Summarized above.    ASSESSMENT & PLAN:   This patient's case was discussed in depth with Dr. Excell Hanson. The plan below was formulated per our discussion.  1. Paroxysmal atrial fibrillation - patient reports remote hx of arrhythmia (?atrial fib - unclear), occurring at most a few times a year this past decade, but this was previously quite symptomatic. More recently he was found to be in rate-controlled atrial fib. He reports being unaware of this but also endorses sensation of chronic fatigue for several months. He was recently started on CPAP 3 weeks ago for OSA. He states he's not feeling as robust as he thought he would starting this. We discussed various options for treatment. He admits he is not thrilled about pursuing treatment for thisbut Dr. Excell Hanson explained the risk of long-term AF if we do not consider pursuing earlier cardioversion. The longer he is in atrial fib, the more his risk is increased of permanency of this rhythm. He is agreeable. Will update labs today including Mg, TSH, CBC, and repeat lytes given HCTZ use. Will add Eliquis 5mg  BID. Stop aspirin. Continue current meds otherwise. Will plan recheck in 2.5 weeks in clinic. If in AF at that time, would arrange DCCV with recommendation to continue anticoagulation for at least 4 weeks after. CHADSVASC is tentatively just 1 for HTN, but LVEF is unknown. Will check echo.  If EF is down, will need to consider lifelong anticoagulation. When he turns 56 years old this will also need to be revisited. Bleeding risks discussed with patient and he is agreeable. Importance of not missing single dose of Eliquis reinforced. 2. Essential HTN - BP running higher than optimal today but did not yet take meds. F/u BP when he returns for follow-up. 3. Pure hypercholesterolemia - followed by PCP, recently at goal. 4. OSA - patient uses app to track usage and has been doing well. Compliance encouraged.  Disposition: F/u with APP in 2.5 weeks for above reassessment.   Medication Adjustments/Labs and Tests Ordered: Current medicines are reviewed at length with the patient today.  Concerns regarding medicines are outlined above. Medication changes, Labs and Tests ordered today are summarized above and listed in the Patient Instructions accessible in Encounters.   Signed, Laurann Montana, PA-C  12/17/2016 9:13 AM    [facility] [address] [place], [place], [place]  [address] Phone: [phone]; Fax: [phone]

## 2016-12-16 DIAGNOSIS — G4733 Obstructive sleep apnea (adult) (pediatric): Secondary | ICD-10-CM | POA: Diagnosis not present

## 2016-12-17 ENCOUNTER — Encounter (INDEPENDENT_AMBULATORY_CARE_PROVIDER_SITE_OTHER): Payer: Self-pay

## 2016-12-17 ENCOUNTER — Encounter: Payer: Self-pay | Admitting: Physician Assistant

## 2016-12-17 ENCOUNTER — Ambulatory Visit (INDEPENDENT_AMBULATORY_CARE_PROVIDER_SITE_OTHER): Payer: BLUE CROSS/BLUE SHIELD | Admitting: Physician Assistant

## 2016-12-17 VITALS — BP 146/82 | HR 89 | Ht 74.0 in | Wt 295.1 lb

## 2016-12-17 DIAGNOSIS — E7800 Pure hypercholesterolemia, unspecified: Secondary | ICD-10-CM

## 2016-12-17 DIAGNOSIS — I480 Paroxysmal atrial fibrillation: Secondary | ICD-10-CM

## 2016-12-17 DIAGNOSIS — G4733 Obstructive sleep apnea (adult) (pediatric): Secondary | ICD-10-CM

## 2016-12-17 DIAGNOSIS — I10 Essential (primary) hypertension: Secondary | ICD-10-CM | POA: Diagnosis not present

## 2016-12-17 MED ORDER — APIXABAN 5 MG PO TABS: 5.0000 mg | ORAL_TABLET | Freq: Two times a day (BID) | ORAL | 1 refills | Status: DC

## 2016-12-17 NOTE — Patient Instructions (Addendum)
Medication Instructions:  Your physician recommends that you schedule a follow-up appointment in:  1.  START Eliquis 5 mg taking 1 tablet by mouth twice a day 2.  DO NOT TAKE A ASPIRIN   Labwork: TODAY:  BMET, MAGNESIUM, TSH, & CBC  Testing/Procedures: Your physician has requested that you have an echocardiogram.  12/21/16 ARRIVE AT 8:15.  Echocardiography is a painless test that uses sound waves to create images of your heart. It provides your doctor with information about the size and shape of your heart and how well your heart's chambers and valves are working. This procedure takes approximately one hour. There are no restrictions for this procedure.   Follow-Up: Your physician recommends that you schedule a follow-up appointment in: 12/31/16 ARRIVE AT 11:00 FOR A 11:15 APPT WITH MICHELE LENZE, PA-C   Any Other Special Instructions Will Be Listed Below (If Applicable).   Echocardiogram An echocardiogram, or echocardiography, uses sound waves (ultrasound) to produce an image of your heart. The echocardiogram is simple, painless, obtained within a short period of time, and offers valuable information to your health care provider. The images from an echocardiogram can provide information such as:  Evidence of coronary artery disease (CAD).  Heart size.  Heart muscle function.  Heart valve function.  Aneurysm detection.  Evidence of a past heart attack.  Fluid buildup around the heart.  Heart muscle thickening.  Assess heart valve function.  Tell a health care provider about:  Any allergies you have.  All medicines you are taking, including vitamins, herbs, eye drops, creams, and over-the-counter medicines.  Any problems you or family members have had with anesthetic medicines.  Any blood disorders you have.  Any surgeries you have had.  Any medical conditions you have.  Whether you are pregnant or may be pregnant. What happens before the procedure? No special  preparation is needed. Eat and drink normally. What happens during the procedure?  In order to produce an image of your heart, gel will be applied to your chest and a wand-like tool (transducer) will be moved over your chest. The gel will help transmit the sound waves from the transducer. The sound waves will harmlessly bounce off your heart to allow the heart images to be captured in real-time motion. These images will then be recorded.  You may need an IV to receive a medicine that improves the quality of the pictures. What happens after the procedure? You may return to your normal schedule including diet, activities, and medicines, unless your health care provider tells you otherwise. This information is not intended to replace advice given to you by your health care provider. Make sure you discuss any questions you have with your health care provider. Document Released: 04/06/2000 Document Revised: 11/26/2015 Document Reviewed: 12/15/2012 Elsevier Interactive Patient Education  2017 [employer].   If you need a refill on your cardiac medications before your next appointment, please call your pharmacy.

## 2016-12-18 LAB — BASIC METABOLIC PANEL
BUN / CREAT RATIO: 12 (ref 9–20)
BUN: 14 mg/dL (ref 6–24)
CO2: 25 mmol/L (ref 20–29)
Calcium: 9.3 mg/dL (ref 8.7–10.2)
Chloride: 99 mmol/L (ref 96–106)
Creatinine, Ser: 1.15 mg/dL (ref 0.76–1.27)
GFR calc non Af Amer: 70 mL/min/{1.73_m2} (ref >59)
GFR, EST AFRICAN AMERICAN: 81 mL/min/{1.73_m2} (ref >59)
Glucose: 93 mg/dL (ref 65–99)
Potassium: 3.8 mmol/L (ref 3.5–5.2)
Sodium: 141 mmol/L (ref 134–144)

## 2016-12-18 LAB — MAGNESIUM: MAGNESIUM: 1.9 mg/dL (ref 1.6–2.3)

## 2016-12-18 LAB — CBC
HEMATOCRIT: 46.2 % (ref 37.5–51.0)
Hemoglobin: 16.1 g/dL (ref 13.0–17.7)
MCH: 29.4 pg (ref 26.6–33.0)
MCHC: 34.8 g/dL (ref 31.5–35.7)
MCV: 84 fL (ref 79–97)
Platelets: 186 10*3/uL (ref 150–379)
RBC: 5.48 x10E6/uL (ref 4.14–5.80)
RDW: 14.1 % (ref 12.3–15.4)
WBC: 7.7 10*3/uL (ref 3.4–10.8)

## 2016-12-18 LAB — TSH: TSH: 3.33 u[IU]/mL (ref 0.450–4.500)

## 2016-12-21 ENCOUNTER — Other Ambulatory Visit: Payer: Self-pay

## 2016-12-21 ENCOUNTER — Ambulatory Visit (HOSPITAL_COMMUNITY): Payer: BLUE CROSS/BLUE SHIELD | Attending: Cardiovascular Disease

## 2016-12-21 DIAGNOSIS — I480 Paroxysmal atrial fibrillation: Secondary | ICD-10-CM

## 2016-12-21 DIAGNOSIS — I420 Dilated cardiomyopathy: Secondary | ICD-10-CM | POA: Diagnosis not present

## 2016-12-31 ENCOUNTER — Encounter: Payer: Self-pay | Admitting: *Deleted

## 2016-12-31 ENCOUNTER — Ambulatory Visit (INDEPENDENT_AMBULATORY_CARE_PROVIDER_SITE_OTHER): Payer: BLUE CROSS/BLUE SHIELD | Admitting: Physician Assistant

## 2016-12-31 ENCOUNTER — Encounter: Payer: Self-pay | Admitting: Physician Assistant

## 2016-12-31 VITALS — BP 126/90 | HR 86 | Ht 74.0 in | Wt 295.0 lb

## 2016-12-31 DIAGNOSIS — G4733 Obstructive sleep apnea (adult) (pediatric): Secondary | ICD-10-CM

## 2016-12-31 DIAGNOSIS — I481 Persistent atrial fibrillation: Secondary | ICD-10-CM

## 2016-12-31 DIAGNOSIS — N183 Chronic kidney disease, stage 3 unspecified: Secondary | ICD-10-CM | POA: Insufficient documentation

## 2016-12-31 DIAGNOSIS — I10 Essential (primary) hypertension: Secondary | ICD-10-CM

## 2016-12-31 DIAGNOSIS — I4819 Other persistent atrial fibrillation: Secondary | ICD-10-CM | POA: Insufficient documentation

## 2016-12-31 NOTE — Progress Notes (Signed)
Cardiology Office Note    Date:  12/31/2016   ID:  Garrett Hanson, DOB 1960/05/10, MRN [id number]  PCP:  Daisy Floro, MD  Cardiologist: needs established  Chief Complaint  Patient presents with  . Follow-up  . Atrial Fibrillation    History of Present Illness:  Garrett Hanson is a 56 y.o. male with history of PAF who saw Lucile Crater, PA-C with Dr. Excell Seltzer at the request of Dr. Duane Lope for evaluation of Afib. Patient was in atrial fibrillation and started on Eliquis. He is brought back today to arrange cardioversion if he still in atrial fibrillation. CHADSVASC=1 for HTN but didn't know his EF. Patient also has hypertension, hypercholesterolemia and OSA, CKD stage 3. 2-D echo 12/21/16 showed normal LVEF 60-65% ascending aorta was mildly dilated 38 mm left and right atrium were severely dilated.  Patient comes in today for f/u. He still complains of fatigue. He doesn't feel any palpitations. Also short of breath with minimal activity.He still in atrial fibrillation. Reviewed echo results with him. Denies chest pain.    Past Medical History:  Diagnosis Date  . Arthritis   . Chronic urticaria   . CKD (chronic kidney disease), stage II   . Complication of anesthesia    pt states awaken during colonscopy   . Essential hypertension   . History of kidney stones   . Lymphadenopathy    a. evaluated by heme-onc 04/2016, under surveillance.  . Obstructive sleep apnea 12/31/2016  . OSA (obstructive sleep apnea)   . Pure hypercholesterolemia     Past Surgical History:  Procedure Laterality Date  . crevical disc surgery      approx 7 years ago   . HERNIA REPAIR     left inguinal 1980  . LITHOTRIPSY    . right elbow surgery     . SHOULDER SURGERY     right approx 8 years ago times 2  . TOTAL KNEE ARTHROPLASTY Left 02/07/2015   Procedure: LEFT TOTAL KNEE ARTHROPLASTY;  Surgeon: Durene Romans, MD;  Location: [facility];  Service: Orthopedics;  Laterality: Left;    Current  Medications: Current Meds  Medication Sig  . apixaban (ELIQUIS) 5 MG TABS tablet Take 1 tablet (5 mg total) by mouth 2 (two) times daily.  Marland Kitchen atorvastatin (LIPITOR) 40 MG tablet Take 40 mg by mouth every evening.  . famotidine (PEPCID) 40 MG tablet Take 40 mg by mouth 2 (two) times daily.  . fluticasone (VERAMYST) 27.5 MCG/SPRAY nasal spray Place 1 spray into the nose every morning.  . hydrochlorothiazide (HYDRODIURIL) 25 MG tablet Take 25 mg by mouth daily.  Marland Kitchen lactobacillus acidophilus (BACID) TABS tablet Take 1 tablet by mouth daily.  Marland Kitchen losartan (COZAAR) 100 MG tablet Take 100 mg by mouth every morning.  . methocarbamol (ROBAXIN) 500 MG tablet Take 1 tablet (500 mg total) by mouth every 6 (six) hours as needed for muscle spasms.  . metoprolol tartrate (LOPRESSOR) 25 MG tablet Take 25 mg by mouth 2 (two) times daily.     Allergies:   Contrast media [iodinated diagnostic agents]; Penicillins; Ace inhibitors; and Singulair [montelukast sodium]   Social History   Social History  . Marital status: Married    Spouse name: N/A  . Number of children: N/A  . Years of education: N/A   Social History Main Topics  . Smoking status: Never Smoker  . Smokeless tobacco: Never Used  . Alcohol use Yes     Comment: occasionally 1-2x per week  .  Drug use: No  . Sexual activity: Not Asked   Other Topics Concern  . None   Social History Narrative  . None     Family History:  The patient's family history includes Breast cancer in his mother; Cancer in his brother; Hyperlipidemia in his brother and mother; Hypertension in his brother and mother; Sleep apnea in his mother; Uterine cancer in his mother.   ROS:   Please see the history of present illness.    Review of Systems  Constitution: Positive for malaise/fatigue.  HENT: Negative.   Cardiovascular: Positive for dyspnea on exertion.  Respiratory: Negative.   Endocrine: Negative.   Hematologic/Lymphatic: Negative.   Musculoskeletal:  Negative.   Gastrointestinal: Negative.   Genitourinary: Negative.   Neurological: Negative.    All other systems reviewed and are negative.   PHYSICAL EXAM:   VS:  BP 126/90   Pulse 86   Ht  (1.88 m)   Wt 295 lb (133.8 kg)   SpO2 97%   BMI 37.88 kg/m   Physical Exam  GEN: Well nourished, well developed, in no acute distress  HEENT: normal  Neck: no JVD, carotid bruits, or masses Cardiac:RRR; no murmurs, rubs, or gallops  Respiratory:  clear to auscultation bilaterally, normal work of breathing GI: soft, nontender, nondistended, + BS Ext: Varicosities bilaterally without cyanosis, clubbing, or edema, Good distal pulses bilaterally MS: no deformity or atrophy  Skin: warm and dry, no rash Neuro:  Alert and Oriented x 3, Strength and sensation are intact Psych: euthymic mood, full affect  Wt Readings from Last 3 Encounters:  12/31/16 295 lb (133.8 kg)  12/17/16 295 lb 1.9 oz (133.9 kg)  04/24/16 294 lb 14.4 oz (133.8 kg)      Studies/Labs Reviewed:   EKG:  EKG is  ordered today.  The ekg ordered today demonstrates Atrial fibrillation at 86 bpm with nonspecific ST-T wave changes, no acute change  Recent Labs: 12/17/2016: BUN 14; Creatinine, Ser 1.15; Hemoglobin 16.1; Magnesium 1.9; Platelets 186; Potassium 3.8; Sodium 141; TSH 3.330   Lipid Panel No results found for: CHOL, TRIG, HDL, CHOLHDL, VLDL, LDLCALC, LDLDIRECT  Additional studies/ records that were reviewed today include:   2-D echo 8/31/18Study Conclusions   - Left ventricle: The cavity size was normal. There was mild   concentric hypertrophy. Systolic function was normal. The   estimated ejection fraction was in the range of 60% to 65%. Wall   motion was normal; there were no regional wall motion   abnormalities. - Aortic valve: Transvalvular velocity was within the normal range.   There was no stenosis. There was no regurgitation. - Aorta: Ascending aortic diameter: 38 mm (S). - Ascending aorta: The  ascending aorta was mildly dilated. - Mitral valve: Transvalvular velocity was within the normal range.   There was no evidence for stenosis. There was trivial   regurgitation. - Left atrium: The atrium was severely dilated. - Right ventricle: The cavity size was normal. Wall thickness was   normal. Systolic function was normal. - Right atrium: The atrium was severely dilated. - Tricuspid valve: There was trivial regurgitation.      ASSESSMENT:    1. Persistent atrial fibrillation (HCC)   2. Essential hypertension   3. CKD (chronic kidney disease), stage III   4. Obstructive sleep apnea      PLAN:  In order of problems listed above:  Persistent atrial fibrillation. Has been on Eliquis since 12/17/16 but missed a dose in the first 2 days  of taking it. Hasn't missed a dose since. Will have patient do cardioversion 01/14/17. Procedure explained patient and he is rate to proceed. Advised not to miss any medication doses. F/U 2 weeks post cardioversion and to be established with cardiologist.  Essential hypertension blood pressure up a little today but he is anxious  CK D stage III creatinine was 1.15 last office visit GFR 70  OSA on CPAP    Medication Adjustments/Labs and Tests Ordered: Current medicines are reviewed at length with the patient today.  Concerns regarding medicines are outlined above.  Medication changes, Labs and Tests ordered today are listed in the Patient Instructions below. Patient Instructions  Medication Instructions:    Your physician recommends that you continue on your current medications as directed. Please refer to the Current Medication list given to you today.  If you need a refill on your cardiac medications before your next appointment, please call your pharmacy.   Labwork: BCB BMET AND PTINR TODAY    Testing/Procedures: SEE LETTER FOR CARDIOVERSION 01-14-17    Follow-Up: TO BE  ESTABLISHED AS NEW PT DR Eldridge DaceVARANASI OR DR Delton SeeNELSON     Any Other  Special Instructions Will Be Listed Below (If Applicable).                                                                                                                                                      Elson ClanSigned, Garrett Lasure, PA-C  12/31/2016 12:14 PM    [facility] [address], [place], [place]  [address] Phone: [phone]; Fax: [phone]

## 2016-12-31 NOTE — Patient Instructions (Addendum)
Medication Instructions:    Your physician recommends that you continue on your current medications as directed. Please refer to the Current Medication list given to you today.  If you need a refill on your cardiac medications before your next appointment, please call your pharmacy.   Labwork: CBB BMET AND PTINR TODAY    Testing/Procedures: SEE LETTER FOR CARDIOVERSION 01-14-17    Follow-Up: TO BE  ESTABLISHED AS NEW PT DR Eldridge DaceVARANASI OR DR NELSON  AS NEW PT  2 WEEKS POST CARDIOVERSION WITH DAYNA DUNN   Any Other Special Instructions Will Be Listed Below (If Applicable).

## 2017-01-01 LAB — CBC
HEMATOCRIT: 47.2 % (ref 37.5–51.0)
HEMOGLOBIN: 15.8 g/dL (ref 13.0–17.7)
MCH: 28.7 pg (ref 26.6–33.0)
MCHC: 33.5 g/dL (ref 31.5–35.7)
MCV: 86 fL (ref 79–97)
Platelets: 225 10*3/uL (ref 150–379)
RBC: 5.51 x10E6/uL (ref 4.14–5.80)
RDW: 14.1 % (ref 12.3–15.4)
WBC: 8.6 10*3/uL (ref 3.4–10.8)

## 2017-01-01 LAB — BASIC METABOLIC PANEL
BUN / CREAT RATIO: 18 (ref 9–20)
BUN: 19 mg/dL (ref 6–24)
CO2: 28 mmol/L (ref 20–29)
CREATININE: 1.03 mg/dL (ref 0.76–1.27)
Calcium: 9.6 mg/dL (ref 8.7–10.2)
Chloride: 99 mmol/L (ref 96–106)
GFR, EST AFRICAN AMERICAN: 93 mL/min/{1.73_m2} (ref >59)
GFR, EST NON AFRICAN AMERICAN: 80 mL/min/{1.73_m2} (ref >59)
Glucose: 86 mg/dL (ref 65–99)
POTASSIUM: 3.8 mmol/L (ref 3.5–5.2)
SODIUM: 141 mmol/L (ref 134–144)

## 2017-01-01 LAB — PROTIME-INR
INR: 1.1 (ref 0.8–1.2)
Prothrombin Time: 11.1 s (ref 9.1–12.0)

## 2017-01-14 ENCOUNTER — Ambulatory Visit (HOSPITAL_COMMUNITY)
Admission: RE | Admit: 2017-01-14 | Discharge: 2017-01-14 | Disposition: A | Discharge status: Home / Self Care | Payer: BLUE CROSS/BLUE SHIELD | Source: Ambulatory Visit | Attending: Cardiology | Admitting: Cardiology

## 2017-01-14 ENCOUNTER — Ambulatory Visit (HOSPITAL_COMMUNITY): Payer: BLUE CROSS/BLUE SHIELD | Admitting: Anesthesiology

## 2017-01-14 ENCOUNTER — Encounter (HOSPITAL_COMMUNITY)
Admission: RE | Disposition: A | Discharge status: Home / Self Care | Payer: Self-pay | Source: Ambulatory Visit | Attending: Cardiology

## 2017-01-14 ENCOUNTER — Telehealth (HOSPITAL_COMMUNITY): Payer: Self-pay | Admitting: *Deleted

## 2017-01-14 ENCOUNTER — Encounter (HOSPITAL_COMMUNITY): Payer: Self-pay | Admitting: Critical Care Medicine

## 2017-01-14 DIAGNOSIS — I4819 Other persistent atrial fibrillation: Secondary | ICD-10-CM

## 2017-01-14 DIAGNOSIS — N183 Chronic kidney disease, stage 3 (moderate): Secondary | ICD-10-CM | POA: Diagnosis not present

## 2017-01-14 DIAGNOSIS — I481 Persistent atrial fibrillation: Secondary | ICD-10-CM | POA: Diagnosis not present

## 2017-01-14 DIAGNOSIS — Z79899 Other long term (current) drug therapy: Secondary | ICD-10-CM | POA: Diagnosis not present

## 2017-01-14 DIAGNOSIS — Z7901 Long term (current) use of anticoagulants: Secondary | ICD-10-CM | POA: Diagnosis not present

## 2017-01-14 DIAGNOSIS — Z96652 Presence of left artificial knee joint: Secondary | ICD-10-CM | POA: Diagnosis not present

## 2017-01-14 DIAGNOSIS — G4733 Obstructive sleep apnea (adult) (pediatric): Secondary | ICD-10-CM | POA: Diagnosis not present

## 2017-01-14 DIAGNOSIS — I129 Hypertensive chronic kidney disease with stage 1 through stage 4 chronic kidney disease, or unspecified chronic kidney disease: Secondary | ICD-10-CM | POA: Insufficient documentation

## 2017-01-14 DIAGNOSIS — E7800 Pure hypercholesterolemia, unspecified: Secondary | ICD-10-CM | POA: Diagnosis not present

## 2017-01-14 HISTORY — PX: CARDIOVERSION: SHX1299

## 2017-01-14 SURGERY — CARDIOVERSION
Anesthesia: General

## 2017-01-14 MED ORDER — SODIUM CHLORIDE 0.9 % IV SOLN
250.0000 mL | INTRAVENOUS | Status: DC
  Administered 2017-01-14: 10:00:00 via INTRAVENOUS

## 2017-01-14 MED ORDER — HYDROCORTISONE 1 % EX CREA: 1.0000 "application " | TOPICAL_CREAM | Freq: Three times a day (TID) | CUTANEOUS | Status: DC | PRN

## 2017-01-14 MED ORDER — PROPOFOL 10 MG/ML IV BOLUS
INTRAVENOUS | Status: DC | PRN
  Administered 2017-01-14: 80 mg via INTRAVENOUS
  Administered 2017-01-14 (×2): 20 mg via INTRAVENOUS

## 2017-01-14 MED ORDER — SODIUM CHLORIDE 0.9% FLUSH: 3.0000 mL | INTRAVENOUS | Status: DC | PRN

## 2017-01-14 MED ORDER — SODIUM CHLORIDE 0.9% FLUSH
3.0000 mL | Freq: Two times a day (BID) | INTRAVENOUS | Status: DC
Start: 2017-01-14 — End: 2017-01-14

## 2017-01-14 NOTE — Transfer of Care (Signed)
Immediate Anesthesia Transfer of Care Note  Patient: Garrett Hanson  Procedure(s) Performed: Procedure(s): CARDIOVERSION (N/A)  Patient Location: PACU  Anesthesia Type:General  Level of Consciousness: awake, alert  and oriented  Airway & Oxygen Therapy: Patient Spontanous Breathing  Post-op Assessment: Report given to RN and Post -op Vital signs reviewed and stable  Post vital signs: Reviewed and stable  Last Vitals:  Vitals:   01/14/17 0908 01/14/17 0910  BP:    Pulse: 72 97  Resp: 18 16  Temp:    SpO2: 95% 94%    Last Pain:  Vitals:   01/14/17 0902  TempSrc: Oral         Complications: No apparent anesthesia complications

## 2017-01-14 NOTE — Discharge Instructions (Signed)
Electrical Cardioversion, Care After °This sheet gives you information about how to care for yourself after your procedure. Your health care provider may also give you more specific instructions. If you have problems or questions, contact your health care provider. °What can I expect after the procedure? °After the procedure, it is common to have: °· Some redness on the skin where the shocks were given. ° °Follow these instructions at home: °· Do not drive for 24 hours if you were given a medicine to help you relax (sedative). °· Take over-the-counter and prescription medicines only as told by your health care provider. °· Ask your health care provider how to check your pulse. Check it often. °· Rest for 48 hours after the procedure or as told by your health care provider. °· Avoid or limit your caffeine use as told by your health care provider. °Contact a health care provider if: °· You feel like your heart is beating too quickly or your pulse is not regular. °· You have a serious muscle cramp that does not go away. °Get help right away if: °· You have discomfort in your chest. °· You are dizzy or you feel faint. °· You have trouble breathing or you are short of breath. °· Your speech is slurred. °· You have trouble moving an arm or leg on one side of your body. °· Your fingers or toes turn cold or blue. °This information is not intended to replace advice given to you by your health care provider. Make sure you discuss any questions you have with your health care provider. °Document Released: 01/28/2013 Document Revised: 11/11/2015 Document Reviewed: 10/14/2015 °Elsevier Interactive Patient Education © 2018 [employer] ° °

## 2017-01-14 NOTE — Anesthesia Preprocedure Evaluation (Addendum)
Anesthesia Evaluation  Patient identified by MRN, date of birth, ID band Patient awake    Reviewed: Allergy & Precautions, NPO status , Patient's Chart, lab work & pertinent test results  Airway Mallampati: I  TM Distance: >3 FB Neck ROM: Full    Dental  (+) Dental Advisory Given, Teeth Intact   Pulmonary sleep apnea ,    Pulmonary exam normal        Cardiovascular hypertension, Normal cardiovascular exam+ dysrhythmias Atrial Fibrillation   Echo 12/21/16 - Left ventricle: The cavity size was normal. There was mild   concentric hypertrophy. Systolic function was normal. The   estimated ejection fraction was in the range of 60% to 65%. Wall   motion was normal; there were no regional wall motion   abnormalities. - Aortic valve: Transvalvular velocity was within the normal range.   There was no stenosis. There was no regurgitation. - Aorta: Ascending aortic diameter: 38 mm (S). - Ascending aorta: The ascending aorta was mildly dilated. - Mitral valve: Transvalvular velocity was within the normal range.   There was no evidence for stenosis. There was trivial   regurgitation. - Left atrium: The atrium was severely dilated. - Right ventricle: The cavity size was normal. Wall thickness was   normal. Systolic function was normal. - Right atrium: The atrium was severely dilated. - Tricuspid valve: There was trivial regurgitation.   Neuro/Psych    GI/Hepatic   Endo/Other    Renal/GU Renal InsufficiencyRenal disease     Musculoskeletal  (+) Arthritis ,   Abdominal   Peds  Hematology   Anesthesia Other Findings   Reproductive/Obstetrics                           Anesthesia Physical Anesthesia Plan  ASA: II  Anesthesia Plan: General   Post-op Pain Management:    Induction: Intravenous  PONV Risk Score and Plan: Propofol infusion and Treatment may vary due to age or medical  condition  Airway Management Planned: Natural Airway  Additional Equipment:   Intra-op Plan:   Post-operative Plan:   Informed Consent: I have reviewed the patients History and Physical, chart, labs and discussed the procedure including the risks, benefits and alternatives for the proposed anesthesia with the patient or authorized representative who has indicated his/her understanding and acceptance.   Dental advisory given  Plan Discussed with: Surgeon and CRNA  Anesthesia Plan Comments:        Anesthesia Quick Evaluation

## 2017-01-14 NOTE — CV Procedure (Signed)
    Electrical Cardioversion Procedure Note Garrett Hanson [id number] 07-Oct-1960  Procedure: Electrical Cardioversion Indications:  Atrial Fibrillation  Time Out: Verified patient identification, verified procedure,medications/allergies/relevent history reviewed, required imaging and test results available.  Performed  Procedure Details  The patient was NPO after midnight. Anesthesia was administered at the beside  by anesthesia with propofol.  Cardioversion was performed with synchronized biphasic defibrillation via AP pads with 120, 150, 200 joules.  3 attempt(s) were performed.  The patient converted to normal sinus rhythm. The patient tolerated the procedure well   IMPRESSION:  Failed cardioversion after 3 attempts.  Discussed with wife.    Donato Schultz 01/14/2017, 10:11 AM

## 2017-01-14 NOTE — Anesthesia Postprocedure Evaluation (Signed)
Anesthesia Post Note  Patient: Garrett Hanson  Procedure(s) Performed: Procedure(s) (LRB): CARDIOVERSION (N/A)     Patient location during evaluation: PACU Anesthesia Type: General Level of consciousness: awake and alert Pain management: pain level controlled Vital Signs Assessment: post-procedure vital signs reviewed and stable Respiratory status: spontaneous breathing, nonlabored ventilation, respiratory function stable and patient connected to nasal cannula oxygen Cardiovascular status: blood pressure returned to baseline and stable Postop Assessment: no apparent nausea or vomiting Anesthetic complications: no    Last Vitals:  Vitals:   01/14/17 1010 01/14/17 1020  BP: (!) 143/128 (!) 153/103  Pulse: 76 71  Resp: 20 12  Temp:    SpO2: 97% 99%    Last Pain:  Vitals:   01/14/17 1006  TempSrc: Oral                 Ryan P Ellender

## 2017-01-14 NOTE — Telephone Encounter (Signed)
Spoke to pt regarding referral from Herma Carson, [place].  Pt will call back when he has his schedule in front of him.

## 2017-01-14 NOTE — Interval H&P Note (Signed)
History and Physical Interval Note:  01/14/2017 9:31 AM  Garrett Hanson  has presented today for surgery, with the diagnosis of afib  The various methods of treatment have been discussed with the patient and family. After consideration of risks, benefits and other options for treatment, the patient has consented to  Procedure(s): CARDIOVERSION (N/A) as a surgical intervention .  The patient's history has been reviewed, patient examined, no change in status, stable for surgery.  I have reviewed the patient's chart and labs.  Questions were answered to the patient's satisfaction.     [employer]

## 2017-01-14 NOTE — H&P (View-Only) (Signed)
Cardiology Office Note    Date:  12/31/2016   ID:  Garrett Hanson, DOB 1960/05/10, MRN [id number]  PCP:  Daisy Floro, MD  Cardiologist: needs established  Chief Complaint  Patient presents with  . Follow-up  . Atrial Fibrillation    History of Present Illness:  Garrett Hanson is a 56 y.o. male with history of PAF who saw Lucile Crater, PA-C with Dr. Excell Seltzer at the request of Dr. Duane Lope for evaluation of Afib. Patient was in atrial fibrillation and started on Eliquis. He is brought back today to arrange cardioversion if he still in atrial fibrillation. CHADSVASC=1 for HTN but didn't know his EF. Patient also has hypertension, hypercholesterolemia and OSA, CKD stage 3. 2-D echo 12/21/16 showed normal LVEF 60-65% ascending aorta was mildly dilated 38 mm left and right atrium were severely dilated.  Patient comes in today for f/u. He still complains of fatigue. He doesn't feel any palpitations. Also short of breath with minimal activity.He still in atrial fibrillation. Reviewed echo results with him. Denies chest pain.    Past Medical History:  Diagnosis Date  . Arthritis   . Chronic urticaria   . CKD (chronic kidney disease), stage II   . Complication of anesthesia    pt states awaken during colonscopy   . Essential hypertension   . History of kidney stones   . Lymphadenopathy    a. evaluated by heme-onc 04/2016, under surveillance.  . Obstructive sleep apnea 12/31/2016  . OSA (obstructive sleep apnea)   . Pure hypercholesterolemia     Past Surgical History:  Procedure Laterality Date  . crevical disc surgery      approx 7 years ago   . HERNIA REPAIR     left inguinal 1980  . LITHOTRIPSY    . right elbow surgery     . SHOULDER SURGERY     right approx 8 years ago times 2  . TOTAL KNEE ARTHROPLASTY Left 02/07/2015   Procedure: LEFT TOTAL KNEE ARTHROPLASTY;  Surgeon: Durene Romans, MD;  Location: [facility];  Service: Orthopedics;  Laterality: Left;    Current  Medications: Current Meds  Medication Sig  . apixaban (ELIQUIS) 5 MG TABS tablet Take 1 tablet (5 mg total) by mouth 2 (two) times daily.  Marland Kitchen atorvastatin (LIPITOR) 40 MG tablet Take 40 mg by mouth every evening.  . famotidine (PEPCID) 40 MG tablet Take 40 mg by mouth 2 (two) times daily.  . fluticasone (VERAMYST) 27.5 MCG/SPRAY nasal spray Place 1 spray into the nose every morning.  . hydrochlorothiazide (HYDRODIURIL) 25 MG tablet Take 25 mg by mouth daily.  Marland Kitchen lactobacillus acidophilus (BACID) TABS tablet Take 1 tablet by mouth daily.  Marland Kitchen losartan (COZAAR) 100 MG tablet Take 100 mg by mouth every morning.  . methocarbamol (ROBAXIN) 500 MG tablet Take 1 tablet (500 mg total) by mouth every 6 (six) hours as needed for muscle spasms.  . metoprolol tartrate (LOPRESSOR) 25 MG tablet Take 25 mg by mouth 2 (two) times daily.     Allergies:   Contrast media [iodinated diagnostic agents]; Penicillins; Ace inhibitors; and Singulair [montelukast sodium]   Social History   Social History  . Marital status: Married    Spouse name: N/A  . Number of children: N/A  . Years of education: N/A   Social History Main Topics  . Smoking status: Never Smoker  . Smokeless tobacco: Never Used  . Alcohol use Yes     Comment: occasionally 1-2x per week  .  Drug use: No  . Sexual activity: Not Asked   Other Topics Concern  . None   Social History Narrative  . None     Family History:  The patient's family history includes Breast cancer in his mother; Cancer in his brother; Hyperlipidemia in his brother and mother; Hypertension in his brother and mother; Sleep apnea in his mother; Uterine cancer in his mother.   ROS:   Please see the history of present illness.    Review of Systems  Constitution: Positive for malaise/fatigue.  HENT: Negative.   Cardiovascular: Positive for dyspnea on exertion.  Respiratory: Negative.   Endocrine: Negative.   Hematologic/Lymphatic: Negative.   Musculoskeletal:  Negative.   Gastrointestinal: Negative.   Genitourinary: Negative.   Neurological: Negative.    All other systems reviewed and are negative.   PHYSICAL EXAM:   VS:  BP 126/90   Pulse 86   Ht 6' 2" (1.88 m)   Wt 295 lb (133.8 kg)   SpO2 97%   BMI 37.88 kg/m   Physical Exam  GEN: Well nourished, well developed, in no acute distress  HEENT: normal  Neck: no JVD, carotid bruits, or masses Cardiac:RRR; no murmurs, rubs, or gallops  Respiratory:  clear to auscultation bilaterally, normal work of breathing GI: soft, nontender, nondistended, + BS Ext: Varicosities bilaterally without cyanosis, clubbing, or edema, Good distal pulses bilaterally MS: no deformity or atrophy  Skin: warm and dry, no rash Neuro:  Alert and Oriented x 3, Strength and sensation are intact Psych: euthymic mood, full affect  Wt Readings from Last 3 Encounters:  12/31/16 295 lb (133.8 kg)  12/17/16 295 lb 1.9 oz (133.9 kg)  04/24/16 294 lb 14.4 oz (133.8 kg)      Studies/Labs Reviewed:   EKG:  EKG is  ordered today.  The ekg ordered today demonstrates Atrial fibrillation at 86 bpm with nonspecific ST-T wave changes, no acute change  Recent Labs: 12/17/2016: BUN 14; Creatinine, Ser 1.15; Hemoglobin 16.1; Magnesium 1.9; Platelets 186; Potassium 3.8; Sodium 141; TSH 3.330   Lipid Panel No results found for: CHOL, TRIG, HDL, CHOLHDL, VLDL, LDLCALC, LDLDIRECT  Additional studies/ records that were reviewed today include:   2-D echo 8/31/18Study Conclusions   - Left ventricle: The cavity size was normal. There was mild   concentric hypertrophy. Systolic function was normal. The   estimated ejection fraction was in the range of 60% to 65%. Wall   motion was normal; there were no regional wall motion   abnormalities. - Aortic valve: Transvalvular velocity was within the normal range.   There was no stenosis. There was no regurgitation. - Aorta: Ascending aortic diameter: 38 mm (S). - Ascending aorta: The  ascending aorta was mildly dilated. - Mitral valve: Transvalvular velocity was within the normal range.   There was no evidence for stenosis. There was trivial   regurgitation. - Left atrium: The atrium was severely dilated. - Right ventricle: The cavity size was normal. Wall thickness was   normal. Systolic function was normal. - Right atrium: The atrium was severely dilated. - Tricuspid valve: There was trivial regurgitation.      ASSESSMENT:    1. Persistent atrial fibrillation (HCC)   2. Essential hypertension   3. CKD (chronic kidney disease), stage III   4. Obstructive sleep apnea      PLAN:  In order of problems listed above:  Persistent atrial fibrillation. Has been on Eliquis since 12/17/16 but missed a dose in the first 2 days   of taking it. Hasn't missed a dose since. Will have patient do cardioversion 01/14/17. Procedure explained patient and he is rate to proceed. Advised not to miss any medication doses. F/U 2 weeks post cardioversion and to be established with cardiologist.  Essential hypertension blood pressure up a little today but he is anxious  CK D stage III creatinine was 1.15 last office visit GFR 70  OSA on CPAP    Medication Adjustments/Labs and Tests Ordered: Current medicines are reviewed at length with the patient today.  Concerns regarding medicines are outlined above.  Medication changes, Labs and Tests ordered today are listed in the Patient Instructions below. Patient Instructions  Medication Instructions:    Your physician recommends that you continue on your current medications as directed. Please refer to the Current Medication list given to you today.  If you need a refill on your cardiac medications before your next appointment, please call your pharmacy.   Labwork: BCB BMET AND PTINR TODAY    Testing/Procedures: SEE LETTER FOR CARDIOVERSION 01-14-17    Follow-Up: TO BE  ESTABLISHED AS NEW PT DR Eldridge DaceVARANASI OR DR Delton SeeNELSON     Any Other  Special Instructions Will Be Listed Below (If Applicable).                                                                                                                                                      Elson ClanSigned, Mazzie Brodrick, PA-C  12/31/2016 12:14 PM    [facility] [address], [place], [place]  [id number] Phone: [phone]; Fax: [phone]

## 2017-01-14 NOTE — Anesthesia Procedure Notes (Signed)
Procedure Name: General with mask airway Date/Time: 01/14/2017 9:56 AM Performed by: Glo Herring B Pre-anesthesia Checklist: Patient identified, Emergency Drugs available, Suction available, Patient being monitored and Timeout performed Patient Re-evaluated:Patient Re-evaluated prior to induction Oxygen Delivery Method: Ambu bag Preoxygenation: Pre-oxygenation with 100% oxygen Induction Type: IV induction Ventilation: Mask ventilation without difficulty Placement Confirmation: breath sounds checked- equal and bilateral and positive ETCO2 Dental Injury: Teeth and Oropharynx as per pre-operative assessment

## 2017-01-16 DIAGNOSIS — G4733 Obstructive sleep apnea (adult) (pediatric): Secondary | ICD-10-CM | POA: Diagnosis not present

## 2017-01-17 ENCOUNTER — Encounter (HOSPITAL_COMMUNITY): Payer: Self-pay | Admitting: Nurse Practitioner

## 2017-01-17 ENCOUNTER — Ambulatory Visit (HOSPITAL_COMMUNITY)
Admission: RE | Admit: 2017-01-17 | Discharge: 2017-01-17 | Disposition: A | Discharge status: Home / Self Care | Payer: BLUE CROSS/BLUE SHIELD | Source: Ambulatory Visit | Attending: Nurse Practitioner | Admitting: Nurse Practitioner

## 2017-01-17 VITALS — BP 156/106 | HR 86 | Ht 74.0 in | Wt 292.4 lb

## 2017-01-17 DIAGNOSIS — Z96652 Presence of left artificial knee joint: Secondary | ICD-10-CM | POA: Diagnosis not present

## 2017-01-17 DIAGNOSIS — Z880 Allergy status to penicillin: Secondary | ICD-10-CM | POA: Diagnosis not present

## 2017-01-17 DIAGNOSIS — Z91041 Radiographic dye allergy status: Secondary | ICD-10-CM | POA: Insufficient documentation

## 2017-01-17 DIAGNOSIS — G4733 Obstructive sleep apnea (adult) (pediatric): Secondary | ICD-10-CM | POA: Diagnosis not present

## 2017-01-17 DIAGNOSIS — I129 Hypertensive chronic kidney disease with stage 1 through stage 4 chronic kidney disease, or unspecified chronic kidney disease: Secondary | ICD-10-CM | POA: Insufficient documentation

## 2017-01-17 DIAGNOSIS — I4819 Other persistent atrial fibrillation: Secondary | ICD-10-CM

## 2017-01-17 DIAGNOSIS — Z888 Allergy status to other drugs, medicaments and biological substances status: Secondary | ICD-10-CM | POA: Insufficient documentation

## 2017-01-17 DIAGNOSIS — E7800 Pure hypercholesterolemia, unspecified: Secondary | ICD-10-CM | POA: Diagnosis not present

## 2017-01-17 DIAGNOSIS — Z7901 Long term (current) use of anticoagulants: Secondary | ICD-10-CM | POA: Insufficient documentation

## 2017-01-17 DIAGNOSIS — Z87442 Personal history of urinary calculi: Secondary | ICD-10-CM | POA: Diagnosis not present

## 2017-01-17 DIAGNOSIS — Z9889 Other specified postprocedural states: Secondary | ICD-10-CM | POA: Insufficient documentation

## 2017-01-17 DIAGNOSIS — N182 Chronic kidney disease, stage 2 (mild): Secondary | ICD-10-CM | POA: Insufficient documentation

## 2017-01-17 DIAGNOSIS — Z79899 Other long term (current) drug therapy: Secondary | ICD-10-CM | POA: Insufficient documentation

## 2017-01-17 DIAGNOSIS — I481 Persistent atrial fibrillation: Secondary | ICD-10-CM | POA: Diagnosis not present

## 2017-01-17 DIAGNOSIS — R0602 Shortness of breath: Secondary | ICD-10-CM | POA: Diagnosis not present

## 2017-01-17 NOTE — Progress Notes (Addendum)
Primary Care Physician: Daisy Floro, MD Referring Physician: [facility]   Garrett Hanson is a 56 y.o. male with a h/o HTN, obesity, recently diagnosed sleep apnea, started CPAP 2 weeks ago. He was found to be in rate controlled afib at time of physical mid summer and was sent to Cardiology. He was set up for cardioversion, 9/24,  and unfortunately would not shock out. He is in the afib clinic for other options.  He reports that 20 years ago he had a couple of episodes of rapid heart rate and possibly Dr. Graciela Husbands, cant for sure remember the name, did cardiac mapping, but no ablation was done and since this was a very infrequent thing, no other treatment was indicated at the time. Now, pt thinks he could have been in afib for a year, as it was not present on his last physical exam. He may have had some intermittent episodes before that. He went to [place] in May and was more short of breath when they were out exploring the area. Echo showed severe enlargement of left and right atrium.  Today, he denies symptoms of palpitations, chest pain, shortness of breath, orthopnea, PND, lower extremity edema, dizziness, presyncope, syncope, or neurologic sequela. The patient is tolerating medications without difficulties and is otherwise without complaint today.   Past Medical History:  Diagnosis Date  . Arthritis   . Chronic urticaria   . CKD (chronic kidney disease), stage II   . Complication of anesthesia    pt states awaken during colonscopy   . Essential hypertension   . History of kidney stones   . Lymphadenopathy    a. evaluated by heme-onc 04/2016, under surveillance.  . Obstructive sleep apnea 12/31/2016  . OSA (obstructive sleep apnea)   . Pure hypercholesterolemia    Past Surgical History:  Procedure Laterality Date  . CARDIOVERSION N/A 01/14/2017   Procedure: CARDIOVERSION;  Surgeon: Jake Bathe, MD;  Location: [facility];  Service: Cardiovascular;  Laterality: N/A;  .  crevical disc surgery      approx 7 years ago   . HERNIA REPAIR     left inguinal 1980  . LITHOTRIPSY    . right elbow surgery     . SHOULDER SURGERY     right approx 8 years ago times 2  . TOTAL KNEE ARTHROPLASTY Left 02/07/2015   Procedure: LEFT TOTAL KNEE ARTHROPLASTY;  Surgeon: Durene Romans, MD;  Location: [facility];  Service: Orthopedics;  Laterality: Left;    Current Outpatient Prescriptions  Medication Sig Dispense Refill  . apixaban (ELIQUIS) 5 MG TABS tablet Take 1 tablet (5 mg total) by mouth 2 (two) times daily. 60 tablet 1  . atorvastatin (LIPITOR) 40 MG tablet Take 40 mg by mouth every evening.    . famotidine (PEPCID) 40 MG tablet Take 40 mg by mouth 2 (two) times daily.    . fluticasone (VERAMYST) 27.5 MCG/SPRAY nasal spray Place 1 spray into the nose every morning.    . hydrochlorothiazide (HYDRODIURIL) 25 MG tablet Take 25 mg by mouth daily.    Marland Kitchen lactobacillus acidophilus (BACID) TABS tablet Take 1 tablet by mouth daily.    Marland Kitchen losartan (COZAAR) 100 MG tablet Take 100 mg by mouth every morning.    . metoprolol tartrate (LOPRESSOR) 25 MG tablet Take 25 mg by mouth 2 (two) times daily.    . methocarbamol (ROBAXIN) 500 MG tablet Take 1 tablet (500 mg total) by mouth every 6 (six) hours as needed for  muscle spasms. (Patient not taking: Reported on 01/17/2017) 50 tablet 0   No current facility-administered medications for this encounter.     Allergies  Allergen Reactions  . Contrast Media [Iodinated Diagnostic Agents] Shortness Of Breath and Palpitations    Broke out in sweats.  . Penicillins Anaphylaxis    Has patient had a PCN reaction causing immediate rash, facial/tongue/throat swelling, SOB or lightheadedness with hypotension: Yes Has patient had a PCN reaction causing severe rash involving mucus membranes or skin necrosis: No Has patient had a PCN reaction that required hospitalization:Yes Has patient had a PCN reaction occurring within the last 10 years: No If all of  the above answers are "NO", then may proceed with Cephalosporin use.   . Ace Inhibitors     Cough   . Singulair [Montelukast Sodium] Swelling    Lips, mouth, and hand swelling     Social History   Social History  . Marital status: Married    Spouse name: N/A  . Number of children: N/A  . Years of education: N/A   Occupational History  . Not on file.   Social History Main Topics  . Smoking status: Never Smoker  . Smokeless tobacco: Never Used  . Alcohol use Yes     Comment: occasionally 1-2x per week  . Drug use: No  . Sexual activity: Not on file   Other Topics Concern  . Not on file   Social History Narrative  . No narrative on file    Family History  Problem Relation Age of Onset  . Hypertension Mother   . Hyperlipidemia Mother   . Breast cancer Mother   . Uterine cancer Mother   . Sleep apnea Mother   . Hypertension Brother   . Hyperlipidemia Brother   . Cancer Brother     ROS- All systems are reviewed and negative except as per the HPI above  Physical Exam: Vitals:   01/17/17 0905  BP: (!) 156/106  Pulse: 86  Weight: 292 lb 6.4 oz (132.6 kg)  Height:  (1.88 m)   Wt Readings from Last 3 Encounters:  01/17/17 292 lb 6.4 oz (132.6 kg)  01/14/17 290 lb (131.5 kg)  12/31/16 295 lb (133.8 kg)    Labs: Lab Results  Component Value Date   NA 141 12/31/2016   K 3.8 12/31/2016   CL 99 12/31/2016   CO2 28 12/31/2016   GLUCOSE 86 12/31/2016   BUN 19 12/31/2016   CREATININE 1.03 12/31/2016   CALCIUM 9.6 12/31/2016   MG 1.9 12/17/2016   Lab Results  Component Value Date   INR 1.1 12/31/2016   No results found for: CHOL, HDL, LDLCALC, TRIG   GEN- The patient is well appearing, alert and oriented x 3 today.   Head- normocephalic, atraumatic Eyes-  Sclera clear, conjunctiva pink Ears- hearing intact Oropharynx- clear Neck- supple, no JVP Lymph- no cervical lymphadenopathy Lungs- Clear to ausculation bilaterally, normal work of  breathing Heart- irregular rate and rhythm, no murmurs, rubs or gallops, PMI not laterally displaced GI- soft, NT, ND, + BS Extremities- no clubbing, cyanosis, or edema MS- no significant deformity or atrophy Skin- no rash or lesion Psych- euthymic mood, full affect Neuro- strength and sensation are intact  EKG-afib at 86 bpm, qrs int 104 ms, qtc 473 ms Epic records reviewed Echo-Study Conclusions  - Left ventricle: The cavity size was normal. There was mild   concentric hypertrophy. Systolic function was normal. The   estimated ejection fraction  was in the range of 60% to 65%. Wall   motion was normal; there were no regional wall motion   abnormalities. - Aortic valve: Transvalvular velocity was within the normal range.   There was no stenosis. There was no regurgitation. - Aorta: Ascending aortic diameter: 38 mm (S). - Ascending aorta: The ascending aorta was mildly dilated. - Mitral valve: Transvalvular velocity was within the normal range.   There was no evidence for stenosis. There was trivial   regurgitation. - Left atrium: The atrium was severely dilated. - Right ventricle: The cavity size was normal. Wall thickness was   normal. Systolic function was normal. - Right atrium: The atrium was severely dilated. - Tricuspid valve: There was trivial regurgitation.   Assessment and Plan: 1. Persistent symptomatic  afib Has c/o of fatigue and shortness of breath with activities Possibly for up to a year with periods of paroxysmal before that Unfortunately failed cardioversion Discussed options Unfortunately, pt thought that he could be given meds short term and that would cure afib, he is very discouraged in the clinic re options, does not want to be on meds for life General education re afib and that it was a treatable but not curable disease  Options of flecainide, Tikosyn, sotalol discussed On young side for amiodarone Currently not a good candidate for ablation due to  weight and afib being persisitent I am pleased that he has been using cpap for the last 2 weeks and has felt better with using this, untreated sleep apnea will seriously undermine efforts to restore SR  I think tiksoyn would be best option as I feel he will be a challenge to return to SR with his obesity and enlarged Left atrium Qtc on only EKG in SR in 2016, 459 ms Continue eliquis 5 mg bid  for chadsvasc score of 1  He will discuss with his wife and I will further discuss with Dr. Johney Frame to make sure this is the very best approach for this pt  Lupita Leash C. [facility] [address] [place], [place] [address] [phone]

## 2017-01-25 ENCOUNTER — Telehealth: Payer: Self-pay | Admitting: Pharmacist

## 2017-01-25 ENCOUNTER — Telehealth: Payer: Self-pay | Admitting: Cardiology

## 2017-01-25 NOTE — Telephone Encounter (Signed)
Discussed with Dr. Renae Fickle. It is appropriate to stop clopidogrel in this patient and not restart it unless he has a new acute coronary event. MCr

## 2017-01-25 NOTE — Telephone Encounter (Signed)
Medication list reviewed in anticipation of upcoming Tikosyn initiation. Patient is taking HCTZ which is contraindicated with Tikosyn and will need to be stopped 3 days prior to Tikosyn start.   K was low on last BMET 1 month ago and may need to be supplemented.  Medication list reports anticoagulation with Eliquis  BID however recent afib notes Xarelto. Would clarify with pt which anticoagulant he is taking. He qualifies for full dose of either.   Please ensure that patient has not missed any anticoagulation doses in the 3 weeks prior to Tikosyn initiation. Patient will need to be counseled to avoid use of Benadryl while on Tikosyn and in the 2-3 days prior to Tikosyn initiation.

## 2017-01-25 NOTE — Addendum Note (Signed)
Encounter addended by: Newman Nip, NP on: 01/25/2017  3:00 PM<BR>    Actions taken: Sign clinical note

## 2017-01-25 NOTE — Telephone Encounter (Signed)
Pt notified to stop HCTZ 3 days prior to admission which is scheduled for 10/16. Encouraged to increase potassium in diet as K has been around 3.6-3.8 recently. Pt reminded to be compliant with Eliquis without missed doses.

## 2017-01-25 NOTE — Telephone Encounter (Signed)
Sorry, last message refers to a different patient. No objection to switching providers. MCR

## 2017-01-25 NOTE — Telephone Encounter (Signed)
New message    Pt would like to switch from Dr. Anne Fu to Dr. Royann Shivers. He said he's only seen anyone in the hospital. Is this ok?

## 2017-01-28 ENCOUNTER — Ambulatory Visit: Payer: BLUE CROSS/BLUE SHIELD | Admitting: Physician Assistant

## 2017-02-04 NOTE — Telephone Encounter (Signed)
Ok with me Thanks Gavriel Lannette Avellino, MD  

## 2017-02-04 NOTE — Telephone Encounter (Signed)
Follow up    Is this switch ok with you Dr. Anne Fu?

## 2017-02-05 ENCOUNTER — Encounter (HOSPITAL_COMMUNITY): Payer: Self-pay | Admitting: Nurse Practitioner

## 2017-02-05 ENCOUNTER — Encounter (HOSPITAL_COMMUNITY): Payer: Self-pay | Admitting: Internal Medicine

## 2017-02-05 ENCOUNTER — Ambulatory Visit (HOSPITAL_COMMUNITY)
Admission: RE | Admit: 2017-02-05 | Discharge: 2017-02-05 | Disposition: A | Discharge status: Home / Self Care | Payer: BLUE CROSS/BLUE SHIELD | Source: Ambulatory Visit | Attending: Nurse Practitioner | Admitting: Nurse Practitioner

## 2017-02-05 ENCOUNTER — Inpatient Hospital Stay (HOSPITAL_COMMUNITY)
Admission: AD | Admit: 2017-02-05 | Discharge: 2017-02-08 | DRG: 310 | Disposition: A | Discharge status: Home / Self Care | Payer: BLUE CROSS/BLUE SHIELD | Source: Ambulatory Visit | Attending: Internal Medicine | Admitting: Internal Medicine

## 2017-02-05 VITALS — BP 148/82 | HR 84 | Ht 74.0 in | Wt 296.8 lb

## 2017-02-05 DIAGNOSIS — Z7901 Long term (current) use of anticoagulants: Secondary | ICD-10-CM | POA: Diagnosis not present

## 2017-02-05 DIAGNOSIS — I481 Persistent atrial fibrillation: Principal | ICD-10-CM

## 2017-02-05 DIAGNOSIS — Z91041 Radiographic dye allergy status: Secondary | ICD-10-CM

## 2017-02-05 DIAGNOSIS — I129 Hypertensive chronic kidney disease with stage 1 through stage 4 chronic kidney disease, or unspecified chronic kidney disease: Secondary | ICD-10-CM | POA: Diagnosis not present

## 2017-02-05 DIAGNOSIS — Z888 Allergy status to other drugs, medicaments and biological substances status: Secondary | ICD-10-CM

## 2017-02-05 DIAGNOSIS — Z23 Encounter for immunization: Secondary | ICD-10-CM

## 2017-02-05 DIAGNOSIS — Z79899 Other long term (current) drug therapy: Secondary | ICD-10-CM

## 2017-02-05 DIAGNOSIS — G4733 Obstructive sleep apnea (adult) (pediatric): Secondary | ICD-10-CM | POA: Diagnosis not present

## 2017-02-05 DIAGNOSIS — I10 Essential (primary) hypertension: Secondary | ICD-10-CM | POA: Diagnosis not present

## 2017-02-05 DIAGNOSIS — E7800 Pure hypercholesterolemia, unspecified: Secondary | ICD-10-CM | POA: Diagnosis not present

## 2017-02-05 DIAGNOSIS — Z6838 Body mass index (BMI) 38.0-38.9, adult: Secondary | ICD-10-CM

## 2017-02-05 DIAGNOSIS — Z880 Allergy status to penicillin: Secondary | ICD-10-CM | POA: Diagnosis not present

## 2017-02-05 DIAGNOSIS — E669 Obesity, unspecified: Secondary | ICD-10-CM | POA: Diagnosis not present

## 2017-02-05 DIAGNOSIS — N182 Chronic kidney disease, stage 2 (mild): Secondary | ICD-10-CM | POA: Diagnosis present

## 2017-02-05 DIAGNOSIS — N183 Chronic kidney disease, stage 3 (moderate): Secondary | ICD-10-CM | POA: Diagnosis not present

## 2017-02-05 DIAGNOSIS — I4819 Other persistent atrial fibrillation: Secondary | ICD-10-CM

## 2017-02-05 DIAGNOSIS — Z96652 Presence of left artificial knee joint: Secondary | ICD-10-CM | POA: Diagnosis not present

## 2017-02-05 HISTORY — DX: Obstructive sleep apnea (adult) (pediatric): G47.33

## 2017-02-05 HISTORY — DX: Personal history of other diseases of the digestive system: Z87.19

## 2017-02-05 HISTORY — DX: Unspecified chronic bronchitis: J42

## 2017-02-05 HISTORY — DX: Other persistent atrial fibrillation: I48.19

## 2017-02-05 HISTORY — DX: Dependence on other enabling machines and devices: Z99.89

## 2017-02-05 LAB — BASIC METABOLIC PANEL
Anion gap: 7 (ref 5–15)
Anion gap: 7 (ref 5–15)
Anion gap: 6 (ref 5–15)
BUN: 9 mg/dL (ref 6–20)
BUN: 13 mg/dL (ref 6–20)
BUN: 16 mg/dL (ref 6–20)
CALCIUM: 8.7 mg/dL — AB (ref 8.9–10.3)
CALCIUM: 9 mg/dL (ref 8.9–10.3)
CHLORIDE: 106 mmol/L (ref 101–111)
CO2: 26 mmol/L (ref 22–32)
CO2: 26 mmol/L (ref 22–32)
CO2: 29 mmol/L (ref 22–32)
CREATININE: 1.12 mg/dL (ref 0.61–1.24)
CREATININE: 1.18 mg/dL (ref 0.61–1.24)
Calcium: 8.8 mg/dL — ABNORMAL LOW (ref 8.9–10.3)
Chloride: 105 mmol/L (ref 101–111)
Chloride: 105 mmol/L (ref 101–111)
Creatinine, Ser: 1.18 mg/dL (ref 0.61–1.24)
GFR calc Af Amer: >60 mL/min (ref >60)
GFR calc non Af Amer: >60 mL/min (ref >60)
GLUCOSE: 98 mg/dL (ref 65–99)
GLUCOSE: 83 mg/dL (ref 65–99)
Glucose, Bld: 110 mg/dL — ABNORMAL HIGH (ref 65–99)
POTASSIUM: 3.8 mmol/L (ref 3.5–5.1)
Potassium: 3.5 mmol/L (ref 3.5–5.1)
Potassium: 4 mmol/L (ref 3.5–5.1)
SODIUM: 139 mmol/L (ref 135–145)
SODIUM: 140 mmol/L (ref 135–145)
Sodium: 138 mmol/L (ref 135–145)

## 2017-02-05 LAB — MAGNESIUM
MAGNESIUM: 2.1 mg/dL (ref 1.7–2.4)
Magnesium: 2 mg/dL (ref 1.7–2.4)

## 2017-02-05 MED ORDER — SODIUM CHLORIDE 0.9 % IV SOLN
250.0000 mL | INTRAVENOUS | Status: DC | PRN
  Administered 2017-02-07: 12:00:00 via INTRAVENOUS

## 2017-02-05 MED ORDER — INFLUENZA VAC SPLIT QUAD 0.5 ML IM SUSY
0.5000 mL | PREFILLED_SYRINGE | INTRAMUSCULAR | Status: AC
  Administered 2017-02-08: 0.5 mL via INTRAMUSCULAR
  Filled 2017-02-05: qty 0.5

## 2017-02-05 MED ORDER — LOSARTAN POTASSIUM 50 MG PO TABS
100.0000 mg | ORAL_TABLET | Freq: Every morning | ORAL | Status: DC
  Administered 2017-02-06 – 2017-02-08 (×2): 100 mg via ORAL
  Filled 2017-02-05 (×2): qty 2

## 2017-02-05 MED ORDER — FAMOTIDINE 20 MG PO TABS
40.0000 mg | ORAL_TABLET | Freq: Two times a day (BID) | ORAL | Status: DC
  Administered 2017-02-05 – 2017-02-08 (×5): 40 mg via ORAL
  Filled 2017-02-05 (×5): qty 2

## 2017-02-05 MED ORDER — METHOCARBAMOL 500 MG PO TABS: 500.0000 mg | ORAL_TABLET | Freq: Four times a day (QID) | ORAL | Status: DC | PRN

## 2017-02-05 MED ORDER — SODIUM CHLORIDE 0.9% FLUSH: 3.0000 mL | INTRAVENOUS | Status: DC | PRN

## 2017-02-05 MED ORDER — FLUTICASONE PROPIONATE 50 MCG/ACT NA SUSP
1.0000 | Freq: Every day | NASAL | Status: DC
  Administered 2017-02-06 – 2017-02-08 (×3): 1 via NASAL
  Filled 2017-02-05: qty 16

## 2017-02-05 MED ORDER — SODIUM CHLORIDE 0.9% FLUSH
3.0000 mL | Freq: Two times a day (BID) | INTRAVENOUS | Status: DC
  Administered 2017-02-05 – 2017-02-08 (×5): 3 mL via INTRAVENOUS

## 2017-02-05 MED ORDER — METOPROLOL TARTRATE 25 MG PO TABS
25.0000 mg | ORAL_TABLET | Freq: Two times a day (BID) | ORAL | Status: DC
  Administered 2017-02-05 – 2017-02-08 (×5): 25 mg via ORAL
  Filled 2017-02-05 (×5): qty 1

## 2017-02-05 MED ORDER — POTASSIUM CHLORIDE CRYS ER 20 MEQ PO TBCR
40.0000 meq | EXTENDED_RELEASE_TABLET | Freq: Once | ORAL | Status: AC
  Administered 2017-02-05: 40 meq via ORAL
  Filled 2017-02-05: qty 2

## 2017-02-05 MED ORDER — DOFETILIDE 500 MCG PO CAPS
500.0000 ug | ORAL_CAPSULE | Freq: Two times a day (BID) | ORAL | Status: DC
Start: 2017-02-05 — End: 2017-02-08
  Administered 2017-02-05 – 2017-02-08 (×6): 500 ug via ORAL
  Filled 2017-02-05 (×6): qty 1
  Filled 2017-02-05 (×2): qty 28

## 2017-02-05 MED ORDER — POTASSIUM CHLORIDE ER 20 MEQ PO TBCR: EXTENDED_RELEASE_TABLET | ORAL | 0 refills | Status: DC

## 2017-02-05 MED ORDER — ATORVASTATIN CALCIUM 40 MG PO TABS
40.0000 mg | ORAL_TABLET | Freq: Every evening | ORAL | Status: DC
  Administered 2017-02-05 – 2017-02-07 (×3): 40 mg via ORAL
  Filled 2017-02-05 (×3): qty 1

## 2017-02-05 MED ORDER — APIXABAN 5 MG PO TABS
5.0000 mg | ORAL_TABLET | Freq: Two times a day (BID) | ORAL | Status: DC
  Administered 2017-02-05 – 2017-02-08 (×6): 5 mg via ORAL
  Filled 2017-02-05 (×6): qty 1

## 2017-02-05 MED ORDER — FLUTICASONE FUROATE 27.5 MCG/SPRAY NA SUSP: 1.0000 | Freq: Every morning | NASAL | Status: DC

## 2017-02-05 NOTE — Progress Notes (Signed)
RT NOTE:  Auto Bipap setup and left @ bedside. Pt will manage when ready for bed. Tubing and Mask brought from home. Auto 14/5 set per home settings. No O2. Sterile water added.

## 2017-02-05 NOTE — Progress Notes (Signed)
Pharmacy Review for Dofetilide (Tikosyn) Initiation  Admit Complaint: 56 y.o. male admitted 02/05/2017 with atrial fibrillation to be initiated on dofetilide.   Assessment:  Patient Exclusion Criteria: If any screening criteria checked as "Yes", then  patient  should NOT receive dofetilide until criteria item is corrected. If "Yes" please indicate correction plan.  YES  NO Patient  Exclusion Criteria Correction Plan    Baseline QTc interval is greater than or equal to 440 msec. IF above YES box checked dofetilide contraindicated unless patient has ICD; then may proceed if QTc 500-550 msec or with known ventricular conduction abnormalities may proceed with QTc 550-600 msec. QTc =   MD calculated Qtc to be 440    Magnesium level is less than 1.8 mEq/l : Last magnesium:  Lab Results  Component Value Date   MG 2.0 02/05/2017           Potassium level is less than 4 mEq/l : Last potassium:  Lab Results  Component Value Date   K 3.5 02/05/2017       Note states planning to give 40 mEq x2 prior to Tikosyn, will f/u for orders    Patient is known or suspected to have a digoxin level greater than 2 ng/ml: No results found for: DIGOXIN      Creatinine clearance less than 20 ml/min (calculated using Cockcroft-Gault, actual body weight and serum creatinine): Estimated Creatinine Clearance: 106 mL/min (by C-G formula based on SCr of 1.12 mg/dL).      Patient has received drugs known to prolong the QT intervals within the last 48 hours (phenothiazines, tricyclics or tetracyclic antidepressants, erythromycin, H-1 antihistamines, cisapride, fluoroquinolones, azithromycin). Drugs not listed above may have an, as yet, undetected potential to prolong the QT interval, updated information on QT prolonging agents is available at this website:QT prolonging agents     Patient received a dose of hydrochlorothiazide (Oretic) alone or in any combination including triamterene  (Dyazide, Maxzide) in the last 48 hours.     Patient received a medication known to increase dofetilide plasma concentrations prior to initial dofetilide dose:  . Trimethoprim (Primsol, Proloprim) in the last 36 hours . Verapamil (Calan, Verelan) in the last 36 hours or a sustained release dose in the last 72 hours . Megestrol (Megace) in the last 5 days  . Cimetidine (Tagamet) in the last 6 hours . Ketoconazole (Nizoral) in the last 24 hours . Itraconazole (Sporanox) in the last 48 hours  . Prochlorperazine (Compazine) in the last 36 hours      Patient is known to have a history of torsades de pointes; congenital or acquired long QT syndromes.     Patient has received a Class 1 antiarrhythmic with less than 2 half-lives since last dose. (Disopyramide, Quinidine, Procainamide, Lidocaine, Mexiletine, Flecainide, Propafenone)     Patient has received amiodarone therapy in the past 3 months or amiodarone level is greater than 0.3 ng/ml.    Patient has been appropriately anticoagulated with Eliquis.  Ordering provider was confirmed at [email or web address] if they are not listed on the [facility] Authorized Prescribers list.   Goal of Therapy: Follow renal function, electrolytes, potential drug interactions, and dose adjustment. Provide education and 1 week supply at discharge.  Plan:    Physician selected initial dose within range recommended for patients level of renal function - will monitor for response.    Physician selected initial dose outside of range recommended for patients level of renal function - will discuss if the dose should be altered  at this time.   Select One Calculated CrCl  Dose q12h   > 60 ml/min 500 mcg   40-60 ml/min 250 mcg   20-40 ml/min 125 mcg   2. Follow up QTc after the first 5 doses, renal function, electrolytes (K & Mg) daily x 3     days, dose adjustment, success of initiation and facilitate 1 week discharge supply as      clinically indicated.  3. Initiate Tikosyn education video (Call [phone] and ask for Tikosyn Video # 116).  4. Place Enrollment Form on the chart for discharge supply of dofetilide.   Baldemar Friday 5:03 PM 02/05/2017

## 2017-02-05 NOTE — H&P (Signed)
Primary Care Physician: Daisy Floro, MD Referring Physician: [facility] Cardiologist: Dr. Royann Shivers  (pending establishing)   Garrett Hanson is a 56 y.o. male with a h/o HTN, obesity, recently diagnosed sleep apnea, started CPAP 4 weeks ago. He was found to be in rate controlled afib at time of physical mid summer and was sent to Cardiology. He was set up for cardioversion, 9/24,  and unfortunately would not shock out. He came into  the afib clinic for other options.  He reports that 20 years ago he had a couple of episodes of rapid heart rate and possibly Dr. Graciela Husbands, can't for sure remember the name, did cardiac mapping, but no ablation was done and since this was a very infrequent thing, no other treatment was indicated at the time. Now, pt thinks he could have been in afib for a year, as it was not present on his last physical exam. He may have had some intermittent episodes before that. He went to [place] in May and was more short of breath when they were out exploring the area. Echo showed severe enlargement of left and right atrium, 47 mm with volume of 93. of . He is more symptomatic with fatigue, dyspnea, hard to do his usual activities.  Today, he denies symptoms of palpitations, chest pain,   orthopnea, PND, lower extremity edema, dizziness, presyncope, syncope, or neurologic sequela. positive for fatigue and mild shortness of breath. The patient is tolerating medications without difficulties and is otherwise without complaint today.       Past Medical History:  Diagnosis Date  . Arthritis   . Chronic urticaria   . CKD (chronic kidney disease), stage II   . Complication of anesthesia    pt states awaken during colonscopy   . Essential hypertension   . History of kidney stones   . Lymphadenopathy    a. evaluated by heme-onc 04/2016, under surveillance.  . Obstructive sleep apnea 12/31/2016  . OSA (obstructive sleep apnea)   . Pure hypercholesterolemia           Past Surgical History:  Procedure Laterality Date  . CARDIOVERSION N/A 01/14/2017   Procedure: CARDIOVERSION;  Surgeon: Jake Bathe, MD;  Location: [facility] ENDOSCOPY;  Service: Cardiovascular;  Laterality: N/A;  . crevical disc surgery      approx 7 years ago   . HERNIA REPAIR     left inguinal 1980  . LITHOTRIPSY    . right elbow surgery     . SHOULDER SURGERY     right approx 8 years ago times 2  . TOTAL KNEE ARTHROPLASTY Left 02/07/2015   Procedure: LEFT TOTAL KNEE ARTHROPLASTY;  Surgeon: Durene Romans, MD;  Location: [facility] ORS;  Service: Orthopedics;  Laterality: Left;          Current Outpatient Prescriptions  Medication Sig Dispense Refill  . apixaban (ELIQUIS) 5 MG TABS tablet Take 1 tablet (5 mg total) by mouth 2 (two) times daily. 60 tablet 1  . atorvastatin (LIPITOR) 40 MG tablet Take 40 mg by mouth every evening.    . famotidine (PEPCID) 40 MG tablet Take 40 mg by mouth 2 (two) times daily.    . fluticasone (VERAMYST) 27.5 MCG/SPRAY nasal spray Place 1 spray into the nose every morning.    . lactobacillus acidophilus (BACID) TABS tablet Take 1 tablet by mouth daily.    Marland Kitchen losartan (COZAAR) 100 MG tablet Take 100 mg by mouth every morning.    . metoprolol tartrate (LOPRESSOR)  25 MG tablet Take 25 mg by mouth 2 (two) times daily.    . hydrochlorothiazide (HYDRODIURIL) 25 MG tablet Take 25 mg by mouth daily.    . methocarbamol (ROBAXIN) 500 MG tablet Take 1 tablet (500 mg total) by mouth every 6 (six) hours as needed for muscle spasms. (Patient not taking: Reported on 01/17/2017) 50 tablet 0   No current facility-administered medications for this encounter.          Allergies  Allergen Reactions  . Contrast Media [Iodinated Diagnostic Agents] Shortness Of Breath and Palpitations    Broke out in sweats.  . Penicillins Anaphylaxis    Has patient had a PCN reaction causing immediate rash, facial/tongue/throat swelling, SOB or  lightheadedness with hypotension: Yes Has patient had a PCN reaction causing severe rash involving mucus membranes or skin necrosis: No Has patient had a PCN reaction that required hospitalization:Yes Has patient had a PCN reaction occurring within the last 10 years: No If all of the above answers are "NO", then may proceed with Cephalosporin use.   . Ace Inhibitors     Cough   . Singulair [Montelukast Sodium] Swelling    Lips, mouth, and hand swelling     Social History        Social History  . Marital status: Married    Spouse name: N/A  . Number of children: N/A  . Years of education: N/A      Occupational History  . Not on file.         Social History Main Topics  . Smoking status: Never Smoker  . Smokeless tobacco: Never Used  . Alcohol use Yes     Comment: occasionally 1-2x per week  . Drug use: No  . Sexual activity: Not on file       Other Topics Concern  . Not on file      Social History Narrative  . No narrative on file         Family History  Problem Relation Age of Onset  . Hypertension Mother   . Hyperlipidemia Mother   . Breast cancer Mother   . Uterine cancer Mother   . Sleep apnea Mother   . Hypertension Brother   . Hyperlipidemia Brother   . Cancer Brother     ROS- All systems are reviewed and negative except as per the HPI above  Physical Exam:    Vitals:   02/05/17 0908  BP: (!) 148/82  Pulse: 84  Weight: 296 lb 12.8 oz (134.6 kg)  Height:  (1.88 m)      Wt Readings from Last 3 Encounters:  02/05/17 296 lb 12.8 oz (134.6 kg)  01/17/17 292 lb 6.4 oz (132.6 kg)  01/14/17 290 lb (131.5 kg)    Labs: Recent Labs       Lab Results  Component Value Date   NA 138 02/05/2017   K 3.5 02/05/2017   CL 105 02/05/2017   CO2 26 02/05/2017   GLUCOSE 98 02/05/2017   BUN 9 02/05/2017   CREATININE 1.12 02/05/2017   CALCIUM 8.7 (L) 02/05/2017   MG 2.0 02/05/2017     Recent  Labs       Lab Results  Component Value Date   INR 1.1 12/31/2016     Recent Labs  No results found for: CHOL, HDL, LDLCALC, TRIG     GEN- The patient is well appearing, alert and oriented x 3 today.   Head- normocephalic, atraumatic Eyes-  Sclera  clear, conjunctiva pink Ears- hearing intact Oropharynx- clear Neck- supple, no JVP Lymph- no cervical lymphadenopathy Lungs- Clear to ausculation bilaterally, normal work of breathing Heart- irregular rate and rhythm, no murmurs, rubs or gallops, PMI not laterally displaced GI- soft, NT, ND, + BS Extremities- no clubbing, cyanosis, or edema MS- no significant deformity or atrophy Skin- no rash or lesion Psych- euthymic mood, full affect Neuro- strength and sensation are intact  EKG-afib at 84 bpm, qrs int 104 ms, qtc 470 ms, corrected by Dr.Calyn Sivils at 440 ms. Epic records reviewed Echo-Study Conclusions  - Left ventricle: The cavity size was normal. There was mild concentric hypertrophy. Systolic function was normal. The estimated ejection fraction was in the range of 60% to 65%. Wall motion was normal; there were no regional wall motion abnormalities. - Aortic valve: Transvalvular velocity was within the normal range. There was no stenosis. There was no regurgitation. - Aorta: Ascending aortic diameter: 38 mm (S). - Ascending aorta: The ascending aorta was mildly dilated. - Mitral valve: Transvalvular velocity was within the normal range. There was no evidence for stenosis. There was trivial regurgitation. - Left atrium: The atrium was severely dilated. - Right ventricle: The cavity size was normal. Wall thickness was normal. Systolic function was normal. - Right atrium: The atrium was severely dilated. - Tricuspid valve: There was trivial regurgitation.   Assessment and Plan: 1. Persistent symptomatic  afib Has c/o of fatigue and shortness of breath with activities Possibly for up to a year  with periods of paroxysmal before that Unfortunately failed cardioversion 12/31/16 Discussed options General education re afib and that it was a treatable but not curable disease  He would like to pursue tikosyn Drugs screened, off HCTZ x 3 days. He may need BP med adjustment while in the hosptial Currently not a good candidate for ablation due to weight and afib being persisitent I am pleased that he has been using cpap for the last 4 weeks and has felt better with using this, untreated sleep apnea will seriously undermine efforts to restore SR  I think tiksoyn would be best option as I feel he will be a challenge to return to SR with his obesity and enlarged Left atrium Qtc on only EKG in SR in 2016, 459 ms, corrected today at 440 ms by Dr. Johney Frame Continue eliquis 5 mg bid  for chadsvasc score of 1, no missed doses No benadryl use Bmet/mag today shows mag at 2.0 and Kt at 3.5. While he is awaiting  bed availability later this afternoon, he will take 40 meq of K+ with food now and 40 meq 4 hours later with food. He will need K+ above 4.0 to start Tikosyn. CrCl cal at 138.44    Donna C. Matthew Folks [facility] [address] [place], [place] [address] [phone]    I have seen, examined the patient, and reviewed the above assessment and plan.  Changes to above are made where necessary.  On exam, iRRR.  Pt with persistent afib.  Therapeutic strategies for afib including medicine and ablation were discussed in detail with the patient today. He would like to start tikosyn.  We will therefore admit for tikosyn initiation.  He reports compliance with anticoagulation without interruption.  Co Sign: Hillis Range, MD 02/05/2017 4:58 PM

## 2017-02-05 NOTE — Progress Notes (Signed)
Primary Care Physician: Daisy Floro, MD Referring Physician: [facility] Cardiologist: Dr. Royann Shivers  (pending establishing)   Garrett Hanson is a 56 y.o. male with a h/o HTN, obesity, recently diagnosed sleep apnea, started CPAP 4 weeks ago. He was found to be in rate controlled afib at time of physical mid summer and was sent to Cardiology. He was set up for cardioversion, 9/24,  and unfortunately would not shock out. He came into  the afib clinic for other options.  He reports that 20 years ago he had a couple of episodes of rapid heart rate and possibly Dr. Graciela Husbands, can't for sure remember the name, did cardiac mapping, but no ablation was done and since this was a very infrequent thing, no other treatment was indicated at the time. Now, pt thinks he could have been in afib for a year, as it was not present on his last physical exam. He may have had some intermittent episodes before that. He went to [place] in May and was more short of breath when they were out exploring the area. Echo showed severe enlargement of left and right atrium, 47 mm with volume of 93. of . He is more symptomatic with fatigue, dyspnea, hard to do his usual activities.  Today, he denies symptoms of palpitations, chest pain,   orthopnea, PND, lower extremity edema, dizziness, presyncope, syncope, or neurologic sequela. positive for fatigue and mild shortness of breath. The patient is tolerating medications without difficulties and is otherwise without complaint today.   Past Medical History:  Diagnosis Date  . Arthritis   . Chronic urticaria   . CKD (chronic kidney disease), stage II   . Complication of anesthesia    pt states awaken during colonscopy   . Essential hypertension   . History of kidney stones   . Lymphadenopathy    a. evaluated by heme-onc 04/2016, under surveillance.  . Obstructive sleep apnea 12/31/2016  . OSA (obstructive sleep apnea)   . Pure hypercholesterolemia    Past Surgical  History:  Procedure Laterality Date  . CARDIOVERSION N/A 01/14/2017   Procedure: CARDIOVERSION;  Surgeon: Jake Bathe, MD;  Location: [facility] ENDOSCOPY;  Service: Cardiovascular;  Laterality: N/A;  . crevical disc surgery      approx 7 years ago   . HERNIA REPAIR     left inguinal 1980  . LITHOTRIPSY    . right elbow surgery     . SHOULDER SURGERY     right approx 8 years ago times 2  . TOTAL KNEE ARTHROPLASTY Left 02/07/2015   Procedure: LEFT TOTAL KNEE ARTHROPLASTY;  Surgeon: Durene Romans, MD;  Location: [facility] ORS;  Service: Orthopedics;  Laterality: Left;    Current Outpatient Prescriptions  Medication Sig Dispense Refill  . apixaban (ELIQUIS) 5 MG TABS tablet Take 1 tablet (5 mg total) by mouth 2 (two) times daily. 60 tablet 1  . atorvastatin (LIPITOR) 40 MG tablet Take 40 mg by mouth every evening.    . famotidine (PEPCID) 40 MG tablet Take 40 mg by mouth 2 (two) times daily.    . fluticasone (VERAMYST) 27.5 MCG/SPRAY nasal spray Place 1 spray into the nose every morning.    . lactobacillus acidophilus (BACID) TABS tablet Take 1 tablet by mouth daily.    Marland Kitchen losartan (COZAAR) 100 MG tablet Take 100 mg by mouth every morning.    . metoprolol tartrate (LOPRESSOR) 25 MG tablet Take 25 mg by mouth 2 (two) times daily.    Marland Kitchen  hydrochlorothiazide (HYDRODIURIL) 25 MG tablet Take 25 mg by mouth daily.    . methocarbamol (ROBAXIN) 500 MG tablet Take 1 tablet (500 mg total) by mouth every 6 (six) hours as needed for muscle spasms. (Patient not taking: Reported on 01/17/2017) 50 tablet 0   No current facility-administered medications for this encounter.     Allergies  Allergen Reactions  . Contrast Media [Iodinated Diagnostic Agents] Shortness Of Breath and Palpitations    Broke out in sweats.  . Penicillins Anaphylaxis    Has patient had a PCN reaction causing immediate rash, facial/tongue/throat swelling, SOB or lightheadedness with hypotension: Yes Has patient had a PCN reaction causing severe  rash involving mucus membranes or skin necrosis: No Has patient had a PCN reaction that required hospitalization:Yes Has patient had a PCN reaction occurring within the last 10 years: No If all of the above answers are "NO", then may proceed with Cephalosporin use.   . Ace Inhibitors     Cough   . Singulair [Montelukast Sodium] Swelling    Lips, mouth, and hand swelling     Social History   Social History  . Marital status: Married    Spouse name: N/A  . Number of children: N/A  . Years of education: N/A   Occupational History  . Not on file.   Social History Main Topics  . Smoking status: Never Smoker  . Smokeless tobacco: Never Used  . Alcohol use Yes     Comment: occasionally 1-2x per week  . Drug use: No  . Sexual activity: Not on file   Other Topics Concern  . Not on file   Social History Narrative  . No narrative on file    Family History  Problem Relation Age of Onset  . Hypertension Mother   . Hyperlipidemia Mother   . Breast cancer Mother   . Uterine cancer Mother   . Sleep apnea Mother   . Hypertension Brother   . Hyperlipidemia Brother   . Cancer Brother     ROS- All systems are reviewed and negative except as per the HPI above  Physical Exam: Vitals:   02/05/17 0908  BP: (!) 148/82  Pulse: 84  Weight: 296 lb 12.8 oz (134.6 kg)  Height:  (1.88 m)   Wt Readings from Last 3 Encounters:  02/05/17 296 lb 12.8 oz (134.6 kg)  01/17/17 292 lb 6.4 oz (132.6 kg)  01/14/17 290 lb (131.5 kg)    Labs: Lab Results  Component Value Date   NA 138 02/05/2017   K 3.5 02/05/2017   CL 105 02/05/2017   CO2 26 02/05/2017   GLUCOSE 98 02/05/2017   BUN 9 02/05/2017   CREATININE 1.12 02/05/2017   CALCIUM 8.7 (L) 02/05/2017   MG 2.0 02/05/2017   Lab Results  Component Value Date   INR 1.1 12/31/2016   No results found for: CHOL, HDL, LDLCALC, TRIG   GEN- The patient is well appearing, alert and oriented x 3 today.   Head- normocephalic,  atraumatic Eyes-  Sclera clear, conjunctiva pink Ears- hearing intact Oropharynx- clear Neck- supple, no JVP Lymph- no cervical lymphadenopathy Lungs- Clear to ausculation bilaterally, normal work of breathing Heart- irregular rate and rhythm, no murmurs, rubs or gallops, PMI not laterally displaced GI- soft, NT, ND, + BS Extremities- no clubbing, cyanosis, or edema MS- no significant deformity or atrophy Skin- no rash or lesion Psych- euthymic mood, full affect Neuro- strength and sensation are intact  EKG-afib at 84 bpm,  qrs int 104 ms, qtc 470 ms, corrected by Dr.Allred at 440 ms. Epic records reviewed Echo-Study Conclusions  - Left ventricle: The cavity size was normal. There was mild   concentric hypertrophy. Systolic function was normal. The   estimated ejection fraction was in the range of 60% to 65%. Wall   motion was normal; there were no regional wall motion   abnormalities. - Aortic valve: Transvalvular velocity was within the normal range.   There was no stenosis. There was no regurgitation. - Aorta: Ascending aortic diameter: 38 mm (S). - Ascending aorta: The ascending aorta was mildly dilated. - Mitral valve: Transvalvular velocity was within the normal range.   There was no evidence for stenosis. There was trivial   regurgitation. - Left atrium: The atrium was severely dilated. - Right ventricle: The cavity size was normal. Wall thickness was   normal. Systolic function was normal. - Right atrium: The atrium was severely dilated. - Tricuspid valve: There was trivial regurgitation.   Assessment and Plan: 1. Persistent symptomatic  afib Has c/o of fatigue and shortness of breath with activities Possibly for up to a year with periods of paroxysmal before that Unfortunately failed cardioversion 12/31/16 Discussed options General education re afib and that it was a treatable but not curable disease  He would like to pursue tikosyn Drugs screened, off HCTZ x 3  days. He may need BP med adjustment while in the hosptial Currently not a good candidate for ablation due to weight and afib being persisitent I am pleased that he has been using cpap for the last 4 weeks and has felt better with using this, untreated sleep apnea will seriously undermine efforts to restore SR  I think tiksoyn would be best option as I feel he will be a challenge to return to SR with his obesity and enlarged Left atrium Qtc on only EKG in SR in 2016, 459 ms, corrected today at 440 ms by Dr. Johney Frame Continue eliquis 5 mg bid  for chadsvasc score of 1, no missed doses No benadryl use Bmet/mag today shows mag at 2.0 and Kt at 3.5. While he is awaiting  bed availability later this afternoon, he will take 40 meq of K+ with food now and 40 meq 4 hours later with food. He will need K+ above 4.0 to start Tikosyn. CrCl cal at 138.44    Donna C. Matthew Folks [facility] [address] [place], [place] [address] [phone]

## 2017-02-06 DIAGNOSIS — I10 Essential (primary) hypertension: Secondary | ICD-10-CM

## 2017-02-06 LAB — BASIC METABOLIC PANEL
ANION GAP: 6 (ref 5–15)
Anion gap: 5 (ref 5–15)
BUN: 13 mg/dL (ref 6–20)
BUN: 14 mg/dL (ref 6–20)
CHLORIDE: 106 mmol/L (ref 101–111)
CHLORIDE: 103 mmol/L (ref 101–111)
CO2: 28 mmol/L (ref 22–32)
CO2: 28 mmol/L (ref 22–32)
CREATININE: 1.03 mg/dL (ref 0.61–1.24)
CREATININE: 0.98 mg/dL (ref 0.61–1.24)
Calcium: 8.6 mg/dL — ABNORMAL LOW (ref 8.9–10.3)
Calcium: 9 mg/dL (ref 8.9–10.3)
GFR calc non Af Amer: >60 mL/min (ref >60)
GFR calc non Af Amer: >60 mL/min (ref >60)
Glucose, Bld: 93 mg/dL (ref 65–99)
Glucose, Bld: 87 mg/dL (ref 65–99)
POTASSIUM: 4.2 mmol/L (ref 3.5–5.1)
POTASSIUM: 4.1 mmol/L (ref 3.5–5.1)
Sodium: 139 mmol/L (ref 135–145)
Sodium: 137 mmol/L (ref 135–145)

## 2017-02-06 LAB — MAGNESIUM
Magnesium: 2 mg/dL (ref 1.7–2.4)
Magnesium: 2.1 mg/dL (ref 1.7–2.4)

## 2017-02-06 LAB — HIV ANTIBODY (ROUTINE TESTING W REFLEX): HIV Screen 4th Generation wRfx: NONREACTIVE

## 2017-02-06 MED ORDER — ACETAMINOPHEN 325 MG PO TABS
650.0000 mg | ORAL_TABLET | ORAL | Status: DC | PRN
Start: 2017-02-06 — End: 2017-02-08
  Administered 2017-02-06 – 2017-02-07 (×2): 650 mg via ORAL
  Filled 2017-02-06 (×2): qty 2

## 2017-02-06 MED ORDER — ZOLPIDEM TARTRATE 5 MG PO TABS
5.0000 mg | ORAL_TABLET | Freq: Every evening | ORAL | Status: DC | PRN
  Filled 2017-02-06: qty 1

## 2017-02-06 MED ORDER — ONDANSETRON HCL 4 MG/2ML IJ SOLN
4.0000 mg | Freq: Four times a day (QID) | INTRAMUSCULAR | Status: DC | PRN
Start: 2017-02-06 — End: 2017-02-08

## 2017-02-06 NOTE — Progress Notes (Signed)
C/O 4 episodes of diarrhea today. Dr. Johney FrameAllred notified. BMP and Mg+ level ordered stat.

## 2017-02-06 NOTE — Plan of Care (Signed)
Problem: Safety: Goal: Ability to remain free from injury will improve Outcome: Progressing Asymptomatic in Afib. VSS. Encouraged to call for needs

## 2017-02-06 NOTE — Progress Notes (Signed)
First dose of tikosyn given at 0000. EKG obtained at 0300. QTc 521. Will continue to monitor patient closely

## 2017-02-06 NOTE — Progress Notes (Signed)
Electrophysiology Rounding Note  Patient Name: Garrett Hanson Date of Encounter: 02/06/2017  Primary Cardiologist: [facility] Electrophysiologist: Mandee Pluta   Subjective   The patient is doing well today.  At this time, the patient denies chest pain, shortness of breath, or any new concerns.  Inpatient Medications    Scheduled Meds: . apixaban  5 mg Oral BID  . atorvastatin  40 mg Oral QPM  . dofetilide  500 mcg Oral BID  . famotidine  40 mg Oral BID  . fluticasone  1 spray Each Nare Daily  . Influenza vac split quadrivalent PF  0.5 mL Intramuscular Tomorrow-1000  . losartan  100 mg Oral q morning - 10a  . metoprolol tartrate  25 mg Oral BID  . sodium chloride flush  3 mL Intravenous Q12H   Continuous Infusions: . sodium chloride     PRN Meds: sodium chloride, methocarbamol, sodium chloride flush   Vital Signs    Vitals:   02/05/17 1601 02/05/17 2215 02/05/17 2332 02/06/17 0319  BP: (!) 144/97 (!) 144/104  127/89  Pulse: 81 72 84 75  Resp:  17  17  Temp: 98.5 F (36.9 C) 98.2 F (36.8 C)  98.1 F (36.7 C)  TempSrc: Oral Oral  Oral  SpO2: 97% 100%  100%  Weight: 296 lb 1.6 oz (134.3 kg)   296 lb 11.2 oz (134.6 kg)  Height: 6\' 2"  (1.88 m)      No intake or output data in the 24 hours ending 02/06/17 0937 Filed Weights   02/05/17 1601 02/06/17 0319  Weight: 296 lb 1.6 oz (134.3 kg) 296 lb 11.2 oz (134.6 kg)    Physical Exam    GEN- The patient is well appearing, alert and oriented x 3 today.   Head- normocephalic, atraumatic Eyes-  Sclera clear, conjunctiva pink Ears- hearing intact Oropharynx- clear Neck- supple Lungs- Clear to ausculation bilaterally, normal work of breathing Heart- Irregular rate and rhythm, no murmurs, rubs or gallops GI- soft, NT, ND, + BS Extremities- no clubbing, cyanosis, or edema Skin- no rash or lesion Psych- euthymic mood, full affect Neuro- strength and sensation are intact  Labs    Basic Metabolic Panel  Recent  Labs  16/01/9609/16/18 1621 02/05/17 2152 02/06/17 0413  NA 139 140 139  K 3.8 4.0 4.2  CL 106 105 106  CO2 26 29 28   GLUCOSE 110* 83 93  BUN 13 16 13   CREATININE 1.18 1.18 1.03  CALCIUM 8.8* 9.0 8.6*  MG 2.1  --  2.0     Telemetry    AF, PVC's (PVC's were present prior to Tikosyn initiation) (personally reviewed)  Radiology    No results found.   Patient Profile     Garrett Hanson is a 56 y.o. male admitted for Tikosyn load   Assessment & Plan    1.  Persistent symptomatic AF Admitted for Tikosyn QTc 460msec by manual measurement this morningKeep K >3.9, Mg >1.8 Continue OAC for CHADS2VASC of 1 DCCV scheduled for tomorrow at [facility] if not converted to SR.  NPO after midnight tonight.   2.  OSA Continue compliance with CPAP  3.  HTN Stable No change required today   Signed, Gypsy BalsamAmber Seiler, NP  02/06/2017, 9:37 AM    I have seen, examined the patient, and reviewed the above assessment and plan.  Changes to above are made where necessary.   On exam, iRRR.  Will plan cardioversion if still in AFib tomorrow.  Co Sign: Hillis RangeJames Paden Kuras,  MD 02/06/2017 8:56 PM

## 2017-02-06 NOTE — Care Management Note (Addendum)
Case Management Note  Patient Details  Name: Garrett Hanson MRN: [id number] Date of Birth: 1960/08/10  Subjective/Objective: Pt presented for Tikosyn Load.                    Action/Plan: Benefits Checkl completed for Tikosyn and CM will make pt aware of the cost. CM will assist with the Rx for the 14 day supply to be filled at the Main Pharmacy. Pt will need an original Rx  for a 90 day supply with refills- pt [facility]- please e scribe. No further needs from CM at this time.   S/W HUSKEY  @ [facility] # [phone]   1. TIKOSYN 125 MCG , 250 MCG AND 500 MCG BID  COVER- YES  PRIOR APPROVAL- YES # [phone]   2. DOFETILIDE 125 MCG, 250 MCG AND 500 MCG BID  COVER- YES  PRIOR APPROVAL- YES # [phone]   [facility]   Expected Discharge Date:                  Expected Discharge Plan:  Home/Self Care  In-House Referral:  NA  Discharge planning Services  CM Consult, Medication Assistance  Post Acute Care Choice:  NA Choice offered to:  NA  DME Arranged:  N/A DME Agency:  NA  HH Arranged:  NA HH Agency:  NA  Status of Service:  Completed, signed off  If discussed at Long Length of Stay Meetings, dates discussed:    Additional Comments:  Gala LewandowskyGraves-Bigelow, [place] Latulippe Kaye, RN 02/06/2017, 3:05 PM

## 2017-02-06 NOTE — Plan of Care (Signed)
Problem: Activity: Goal: Risk for activity intolerance will decrease Outcome: Progressing Patient up ad lib. Ambulates independently. Educated on call light system. Verbalizes understanding of importance of calling for assistance prior to ambulation when necessary

## 2017-02-07 ENCOUNTER — Encounter (HOSPITAL_COMMUNITY)
Admission: AD | Disposition: A | Discharge status: Home / Self Care | Payer: Self-pay | Source: Ambulatory Visit | Attending: Internal Medicine

## 2017-02-07 ENCOUNTER — Inpatient Hospital Stay (HOSPITAL_COMMUNITY): Payer: BLUE CROSS/BLUE SHIELD | Admitting: Anesthesiology

## 2017-02-07 ENCOUNTER — Encounter (HOSPITAL_COMMUNITY): Payer: Self-pay

## 2017-02-07 HISTORY — PX: CARDIOVERSION: SHX1299

## 2017-02-07 LAB — BASIC METABOLIC PANEL
Anion gap: 7 (ref 5–15)
BUN: 14 mg/dL (ref 6–20)
CO2: 27 mmol/L (ref 22–32)
CREATININE: 1.1 mg/dL (ref 0.61–1.24)
Calcium: 8.7 mg/dL — ABNORMAL LOW (ref 8.9–10.3)
Chloride: 105 mmol/L (ref 101–111)
Glucose, Bld: 94 mg/dL (ref 65–99)
Potassium: 4.1 mmol/L (ref 3.5–5.1)
SODIUM: 139 mmol/L (ref 135–145)

## 2017-02-07 LAB — MAGNESIUM: MAGNESIUM: 2 mg/dL (ref 1.7–2.4)

## 2017-02-07 SURGERY — CARDIOVERSION
Anesthesia: General

## 2017-02-07 MED ORDER — LIDOCAINE 2% (20 MG/ML) 5 ML SYRINGE
INTRAMUSCULAR | Status: DC | PRN
  Administered 2017-02-07: 100 mg via INTRAVENOUS

## 2017-02-07 MED ORDER — HYDROCORTISONE 1 % EX CREA
TOPICAL_CREAM | Freq: Four times a day (QID) | CUTANEOUS | Status: DC | PRN
  Administered 2017-02-07: 15:00:00 via TOPICAL
  Filled 2017-02-07: qty 28

## 2017-02-07 MED ORDER — PROPOFOL 10 MG/ML IV BOLUS
INTRAVENOUS | Status: DC | PRN
  Administered 2017-02-07: 110 mg via INTRAVENOUS

## 2017-02-07 NOTE — Anesthesia Preprocedure Evaluation (Addendum)
Anesthesia Evaluation  Patient identified by MRN, date of birth, ID band Patient awake    Reviewed: Allergy & Precautions, NPO status , Patient's Chart, lab work & pertinent test results  Airway Mallampati: II  TM Distance: >3 FB Neck ROM: Full    Dental  (+) Dental Advisory Given, Teeth Intact   Pulmonary sleep apnea ,    Pulmonary exam normal        Cardiovascular hypertension, Normal cardiovascular exam+ dysrhythmias Atrial Fibrillation   Echo 12/21/16 - Left ventricle: The cavity size was normal. There was mild   concentric hypertrophy. Systolic function was normal. The   estimated ejection fraction was in the range of 60% to 65%. Wall   motion was normal; there were no regional wall motion   abnormalities. - Aortic valve: Transvalvular velocity was within the normal range.   There was no stenosis. There was no regurgitation. - Aorta: Ascending aortic diameter: 38 mm (S). - Ascending aorta: The ascending aorta was mildly dilated. - Mitral valve: Transvalvular velocity was within the normal range.   There was no evidence for stenosis. There was trivial   regurgitation. - Left atrium: The atrium was severely dilated. - Right ventricle: The cavity size was normal. Wall thickness was   normal. Systolic function was normal. - Right atrium: The atrium was severely dilated. - Tricuspid valve: There was trivial regurgitation.   Neuro/Psych    GI/Hepatic   Endo/Other    Renal/GU Renal InsufficiencyRenal disease     Musculoskeletal  (+) Arthritis ,   Abdominal   Peds  Hematology   Anesthesia Other Findings   Reproductive/Obstetrics                            Anesthesia Physical  Anesthesia Plan  ASA: III  Anesthesia Plan: General   Post-op Pain Management:    Induction: Intravenous  PONV Risk Score and Plan: Propofol infusion and Treatment may vary due to age or medical  condition  Airway Management Planned: Natural Airway  Additional Equipment:   Intra-op Plan:   Post-operative Plan:   Informed Consent: I have reviewed the patients History and Physical, chart, labs and discussed the procedure including the risks, benefits and alternatives for the proposed anesthesia with the patient or authorized representative who has indicated his/her understanding and acceptance.   Dental advisory given  Plan Discussed with: Surgeon and CRNA  Anesthesia Plan Comments:         Anesthesia Quick Evaluation

## 2017-02-07 NOTE — CV Procedure (Signed)
Electrical Cardioversion Procedure Note Garrett FendtMark J Hanson [id number] 1960/08/19  Procedure: Electrical Cardioversion Indications:  Atrial Fibrillation  Procedure Details Consent: Risks of procedure as well as the alternatives and risks of each were explained to the (patient/caregiver).  Consent for procedure obtained. Time Out: Verified patient identification, verified procedure, site/side was marked, verified correct patient position, special equipment/implants available, medications/allergies/relevent history reviewed, required imaging and test results available.  Performed  Patient placed on cardiac monitor, pulse oximetry, supplemental oxygen as necessary.  Sedation given: Propofol Pacer pads placed anterior and posterior chest.  Cardioverted 2 time(s).  Cardioverted at 200J.  First attempt unsuccessful.  Second attempt was successful after pressure was applied to the chest.   Evaluation Findings: Post procedure EKG shows: NSR Complications: None Patient did tolerate procedure well.   Garrett Siiffany Mallory, MD 02/07/2017, 12:42 PM

## 2017-02-07 NOTE — Transfer of Care (Signed)
Immediate Anesthesia Transfer of Care Note  Patient: Garrett RainbowMark J Matkins  Procedure(s) Performed: CARDIOVERSION (N/A )  Patient Location: Endoscopy Unit  Anesthesia Type:General  Level of Consciousness: awake, alert  and oriented  Airway & Oxygen Therapy: Patient Spontanous Breathing and Patient connected to nasal cannula oxygen  Post-op Assessment: Report given to RN, Post -op Vital signs reviewed and stable and Patient moving all extremities  Post vital signs: Reviewed and stable  Last Vitals:  Vitals:   02/07/17 0335 02/07/17 1125  BP: (!) 138/99 (!) 161/103  Pulse: 77 85  Resp: 18 13  Temp: 36.4 C (!) 36.4 C  SpO2: 97% 98%    Last Pain:  Vitals:   02/07/17 1125  TempSrc: Oral  PainSc:       Patients Stated Pain Goal: 0 (02/07/17 1000)  Complications: No apparent anesthesia complications

## 2017-02-07 NOTE — Discharge Summary (Addendum)
ELECTROPHYSIOLOGY PROCEDURE DISCHARGE SUMMARY    Patient ID: Garrett FendtMark J Rodick,  MRN: [id number], DOB/AGE: [birth year] 56 y.o.  Admit date: 02/05/2017 Discharge date: 02/08/2017  Primary Care Physician: Daisy Florooss, Charles Alan, MD Primary Cardiologist: Croitoru Electrophysiologist: Tzion Wedel  Primary Discharge Diagnosis:  1.  Persistent atrial fibrillation status post Tikosyn loading this admission  Secondary Discharge Diagnosis:  1.  OSA   Allergies  Allergen Reactions  . Contrast Media [Iodinated Diagnostic Agents] Shortness Of Breath and Palpitations    Broke out in sweats.  . Penicillins Anaphylaxis    Has patient had a PCN reaction causing immediate rash, facial/tongue/throat swelling, SOB or lightheadedness with hypotension: Yes Has patient had a PCN reaction causing severe rash involving mucus membranes or skin necrosis: No Has patient had a PCN reaction that required hospitalization:Yes Has patient had a PCN reaction occurring within the last 10 years: No If all of the above answers are "NO", then may proceed with Cephalosporin use.   Marland Kitchen. Singulair [Montelukast Sodium] Swelling    Lips, mouth, and hand swelling      Procedures This Admission:  1.  Tikosyn loading 2.  Direct current cardioversion on 02/07/17 by Dr Duke Salviaandolph which successfully restored SR.  There were no early apparent complications.   Brief HPI: Garrett Hanson is a 56 y.o. male with a past medical history as noted above.  They were referred to EP in the outpatient setting for treatment options of atrial fibrillation.  Risks, benefits, and alternatives to Tikosyn were reviewed with the patient who wished to proceed.    Hospital Course:  The patient was admitted and Tikosyn was initiated.  Renal function and electrolytes were followed during the hospitalization.  Their QTc remained stable.  On 02/07/17 they underwent direct current cardioversion which restored sinus rhythm.  They were monitored until  discharge on telemetry which demonstrated sinus rhythm..  On the day of discharge, they were examined by Dr Johney FrameAllred who considered them stable for discharge to home.  Follow-up has been arranged with AF clinic in 1 week and with Dr Johney FrameAllred in 12 weeks.   Physical Exam: Vitals:   02/07/17 2221 02/07/17 2324 02/08/17 0656 02/08/17 1015  BP: (!) 144/104 (!) 155/109 (!) 134/104 (!) 142/97  Pulse:   65 65  Resp:   16   Temp:   97.6 F (36.4 C)   TempSrc:   Oral   SpO2:   100%   Weight:      Height:        GEN- The patient is well appearing, alert and oriented x 3 today.   HEENT: normocephalic, atraumatic; sclera clear, conjunctiva pink; hearing intact; oropharynx clear; neck supple, no JVP Lymph- no cervical lymphadenopathy Lungs- Clear to ausculation bilaterally, normal work of breathing.  No wheezes, rales, rhonchi Heart- Regular rate and rhythm, no murmurs, rubs or gallops  GI- soft, non-tender, non-distended, bowel sounds present  Extremities- no clubbing, cyanosis, or edema  MS- no significant deformity or atrophy Skin- warm and dry, no rash or lesion Psych- euthymic mood, full affect Neuro- strength and sensation are intact   Labs:   Lab Results  Component Value Date   WBC 8.6 12/31/2016   HGB 15.8 12/31/2016   HCT 47.2 12/31/2016   MCV 86 12/31/2016   PLT 225 12/31/2016     Recent Labs Lab 02/08/17 0751  NA 139  K 3.8  CL 104  CO2 28  BUN 8  CREATININE 0.98  CALCIUM 8.7*  GLUCOSE 87  Discharge Medications:  Allergies as of 02/08/2017      Reactions   Contrast Media [iodinated Diagnostic Agents] Shortness Of Breath, Palpitations   Broke out in sweats.   Penicillins Anaphylaxis   Has patient had a PCN reaction causing immediate rash, facial/tongue/throat swelling, SOB or lightheadedness with hypotension: Yes Has patient had a PCN reaction causing severe rash involving mucus membranes or skin necrosis: No Has patient had a PCN reaction that required  hospitalization:Yes Has patient had a PCN reaction occurring within the last 10 years: No If all of the above answers are "NO", then may proceed with Cephalosporin use.   Singulair [montelukast Sodium] Swelling   Lips, mouth, and hand swelling      Medication List    STOP taking these medications   hydrochlorothiazide 25 MG tablet Commonly known as:  HYDRODIURIL   Potassium Chloride ER 20 MEQ Tbcr     TAKE these medications   apixaban 5 MG Tabs tablet Commonly known as:  ELIQUIS Take 1 tablet (5 mg total) by mouth 2 (two) times daily.   atorvastatin 40 MG tablet Commonly known as:  LIPITOR Take 40 mg by mouth every evening.   dofetilide 500 MCG capsule Commonly known as:  TIKOSYN Take 1 capsule (500 mcg total) by mouth 2 (two) times daily.   famotidine 40 MG tablet Commonly known as:  PEPCID Take 40 mg by mouth 2 (two) times daily.   fluticasone 27.5 MCG/SPRAY nasal spray Commonly known as:  VERAMYST Place 1 spray into the nose every morning.   hydrocortisone cream 1 % Apply topically every 6 (six) hours as needed for itching.   lactobacillus acidophilus Tabs tablet Take 1 tablet by mouth daily.   losartan 100 MG tablet Commonly known as:  COZAAR Take 100 mg by mouth every morning.   methocarbamol 500 MG tablet Commonly known as:  ROBAXIN Take 1 tablet (500 mg total) by mouth every 6 (six) hours as needed for muscle spasms.   metoprolol tartrate 25 MG tablet Commonly known as:  LOPRESSOR Take 25 mg by mouth 2 (two) times daily.   nystatin powder Commonly known as:  MYCOSTATIN/NYSTOP Apply 1 application topically 2 (two) times daily as needed.       Disposition:   Follow-up Information    [facility] Follow up on 02/15/2017.   Specialty:  Cardiology Why:  at [facility] information: [address] [id number] [place] [address] [phone]          Duration of Discharge Encounter: Greater  than 30 minutes including physician time.  Randolm Idol MD 02/08/2017 10:40 AM

## 2017-02-07 NOTE — H&P (View-Only) (Signed)
   Electrophysiology Rounding Note  Patient Name: Garrett Hanson Date of Encounter: 02/07/2017    Subjective   The patient is doing well today.  At this time, the patient denies chest pain, shortness of breath, or any new concerns.  Inpatient Medications    Scheduled Meds: . apixaban  5 mg Oral BID  . atorvastatin  40 mg Oral QPM  . dofetilide  500 mcg Oral BID  . famotidine  40 mg Oral BID  . fluticasone  1 spray Each Nare Daily  . Influenza vac split quadrivalent PF  0.5 mL Intramuscular Tomorrow-1000  . losartan  100 mg Oral q morning - 10a  . metoprolol tartrate  25 mg Oral BID  . sodium chloride flush  3 mL Intravenous Q12H   Continuous Infusions: . sodium chloride     PRN Meds: sodium chloride, acetaminophen, methocarbamol, ondansetron (ZOFRAN) IV, sodium chloride flush, zolpidem   Vital Signs    Vitals:   02/06/17 1941 02/06/17 2200 02/07/17 0022 02/07/17 0335  BP: (!) 131/105   (!) 138/99  Pulse: 77  69 77  Resp: 17  16 18  Temp: 98.4 F (36.9 C)   97.6 F (36.4 C)  TempSrc: Oral   Oral  SpO2: 97% 97% 100% 97%  Weight:      Height:        Intake/Output Summary (Last 24 hours) at 02/07/17 0731 Last data filed at 02/06/17 1943  Gross per 24 hour  Intake              720 ml  Output                3 ml  Net              717 ml   Filed Weights   02/05/17 1601 02/06/17 0319  Weight: 296 lb 1.6 oz (134.3 kg) 296 lb 11.2 oz (134.6 kg)    Physical Exam    GEN- The patient is well appearing, alert and oriented x 3 today.   Head- normocephalic, atraumatic Eyes-  Sclera clear, conjunctiva pink Ears- hearing intact Oropharynx- clear Neck- supple Lungs- Clear to ausculation bilaterally, normal work of breathing Heart- irregular rate and rhythm, no murmurs, rubs or gallops GI- soft, NT, ND, + BS Extremities- no clubbing, cyanosis, or edema Skin- no rash or lesion Psych- euthymic mood, full affect Neuro- strength and sensation are intact  Labs      CBC No results for input(s): WBC, NEUTROABS, HGB, HCT, MCV, PLT in the last 72 hours. Basic Metabolic Panel  Recent Labs  02/06/17 1844 02/07/17 0323  NA 137 139  K 4.1 4.1  CL 103 105  CO2 28 27  GLUCOSE 87 94  BUN 14 14  CREATININE 0.98 1.10  CALCIUM 9.0 8.7*  MG 2.1 2.0     Telemetry    afib (personally reviewed)   Assessment & Plan    1. Persistent afib Remains in afib on tikosyn Planned for cardioversion today Risks of cardioversion discussed with patient who wishes to proceed  2. Diarrhea Some yesterday, now resolved Labs stable  3. OSA Stable No change required today  Anticipate discharge tomorrow  Tamberlyn Midgley MD, FACC 02/07/2017 7:52 AM   

## 2017-02-07 NOTE — Progress Notes (Signed)
EKG obtained 3 hours after administering pm dose of Tikosyn. QTc 521. Will continue to monitor patient closely.

## 2017-02-07 NOTE — Plan of Care (Signed)
Problem: Nutrition: Goal: Adequate nutrition will be maintained Outcome: Progressing Patient compliant with cardiac diet. Consumes 75-100% of meals

## 2017-02-07 NOTE — Progress Notes (Signed)
   Electrophysiology Rounding Note  Patient Name: Garrett Hanson Date of Encounter: 02/07/2017    Subjective   The patient is doing well today.  At this time, the patient denies chest pain, shortness of breath, or any new concerns.  Inpatient Medications    Scheduled Meds: . apixaban  5 mg Oral BID  . atorvastatin  40 mg Oral QPM  . dofetilide  500 mcg Oral BID  . famotidine  40 mg Oral BID  . fluticasone  1 spray Each Nare Daily  . Influenza vac split quadrivalent PF  0.5 mL Intramuscular Tomorrow-1000  . losartan  100 mg Oral q morning - 10a  . metoprolol tartrate  25 mg Oral BID  . sodium chloride flush  3 mL Intravenous Q12H   Continuous Infusions: . sodium chloride     PRN Meds: sodium chloride, acetaminophen, methocarbamol, ondansetron (ZOFRAN) IV, sodium chloride flush, zolpidem   Vital Signs    Vitals:   02/06/17 1941 02/06/17 2200 02/07/17 0022 02/07/17 0335  BP: (!) 131/105   (!) 138/99  Pulse: 77  69 77  Resp: 17  16 18   Temp: 98.4 F (36.9 C)   97.6 F (36.4 C)  TempSrc: Oral   Oral  SpO2: 97% 97% 100% 97%  Weight:      Height:        Intake/Output Summary (Last 24 hours) at 02/07/17 0731 Last data filed at 02/06/17 1943  Gross per 24 hour  Intake              720 ml  Output                3 ml  Net              717 ml   Filed Weights   02/05/17 1601 02/06/17 0319  Weight: 296 lb 1.6 oz (134.3 kg) 296 lb 11.2 oz (134.6 kg)    Physical Exam    GEN- The patient is well appearing, alert and oriented x 3 today.   Head- normocephalic, atraumatic Eyes-  Sclera clear, conjunctiva pink Ears- hearing intact Oropharynx- clear Neck- supple Lungs- Clear to ausculation bilaterally, normal work of breathing Heart- irregular rate and rhythm, no murmurs, rubs or gallops GI- soft, NT, ND, + BS Extremities- no clubbing, cyanosis, or edema Skin- no rash or lesion Psych- euthymic mood, full affect Neuro- strength and sensation are intact  Labs      CBC No results for input(s): WBC, NEUTROABS, HGB, HCT, MCV, PLT in the last 72 hours. Basic Metabolic Panel  Recent Labs  02/06/17 1844 02/07/17 0323  NA 137 139  K 4.1 4.1  CL 103 105  CO2 28 27  GLUCOSE 87 94  BUN 14 14  CREATININE 0.98 1.10  CALCIUM 9.0 8.7*  MG 2.1 2.0     Telemetry    afib (personally reviewed)   Assessment & Plan    1. Persistent afib Remains in afib on tikosyn Planned for cardioversion today Risks of cardioversion discussed with patient who wishes to proceed  2. Diarrhea Some yesterday, now resolved Labs stable  3. OSA Stable No change required today  Anticipate discharge tomorrow  Hillis RangeJames Arriyana Rodell MD, [facility] Center[facility]ACC 02/07/2017 7:52 AM

## 2017-02-07 NOTE — Interval H&P Note (Signed)
History and Physical Interval Note:  02/07/2017 12:51 PM  Garrett Hanson  has presented today for surgery, with the diagnosis of a fib  The various methods of treatment have been discussed with the patient and family. After consideration of risks, benefits and other options for treatment, the patient has consented to  Procedure(s): CARDIOVERSION (N/A) as a surgical intervention .  The patient's history has been reviewed, patient examined, no change in status, stable for surgery.  I have reviewed the patient's chart and labs.  Questions were answered to the patient's satisfaction.     Chilton Siiffany [place], MD

## 2017-02-07 NOTE — Anesthesia Procedure Notes (Signed)
Procedure Name: General with mask airway Date/Time: 02/07/2017 12:38 PM Performed by: Sharlene DoryWALKER, Suan Pyeatt E Pre-anesthesia Checklist: Patient identified, Emergency Drugs available, Suction available, Patient being monitored and Timeout performed Patient Re-evaluated:Patient Re-evaluated prior to induction Oxygen Delivery Method: Ambu bag Preoxygenation: Pre-oxygenation with 100% oxygen Induction Type: IV induction Ventilation: Mask ventilation without difficulty Dental Injury: Teeth and Oropharynx as per pre-operative assessment

## 2017-02-08 ENCOUNTER — Encounter (HOSPITAL_COMMUNITY): Payer: Self-pay | Admitting: Cardiovascular Disease

## 2017-02-08 LAB — BASIC METABOLIC PANEL
ANION GAP: 7 (ref 5–15)
BUN: 8 mg/dL (ref 6–20)
CALCIUM: 8.7 mg/dL — AB (ref 8.9–10.3)
CO2: 28 mmol/L (ref 22–32)
CREATININE: 0.98 mg/dL (ref 0.61–1.24)
Chloride: 104 mmol/L (ref 101–111)
GFR calc Af Amer: >60 mL/min (ref >60)
Glucose, Bld: 87 mg/dL (ref 65–99)
Potassium: 3.8 mmol/L (ref 3.5–5.1)
Sodium: 139 mmol/L (ref 135–145)

## 2017-02-08 LAB — MAGNESIUM: MAGNESIUM: 2.1 mg/dL (ref 1.7–2.4)

## 2017-02-08 MED ORDER — POTASSIUM CHLORIDE CRYS ER 20 MEQ PO TBCR
20.0000 meq | EXTENDED_RELEASE_TABLET | Freq: Once | ORAL | Status: AC
  Administered 2017-02-08: 20 meq via ORAL
  Filled 2017-02-08: qty 1

## 2017-02-08 MED ORDER — DOFETILIDE 500 MCG PO CAPS: 500.0000 ug | ORAL_CAPSULE | Freq: Two times a day (BID) | ORAL | 3 refills | Status: DC

## 2017-02-08 MED ORDER — HYDROCORTISONE 1 % EX CREA: TOPICAL_CREAM | Freq: Four times a day (QID) | CUTANEOUS | 0 refills | Status: DC | PRN

## 2017-02-08 NOTE — Progress Notes (Addendum)
Per Dr. Johney FrameAllred, he completed discharge and pt is OK to go after 1 dose of potassium. He does not feel the patient will need standing potassium since HCTZ was stopped this admission. He believes potassium will be wnl once patient back on regular diet at home, and pt has plans for recheck at f/u OV. MD reviewed QTc from this AM as well. Plan relayed to nurse. Ormond Lazo PA-C  Addendum: received call from inpatient pharmacy/care management who identified that short term hard copy rx was not yet sent down to pharmacy and is not available on the chart (recommended for 14 days given original plan to rx to mail order). Chart reviewed at length, dose appropriate, QTc acceptable. [facility] to [facility]. Per review of chart, at time of discharge Dr. Johney FrameAllred already sent in prescription to local [facility]. Patient is aware and is perfectly fine with this. Care management also aware of the switch. Pharmacy is OK with the patient still receiving the 14 day rx. Care management also indicated prior auth phone call was necessary which I completed. PA number [phone], office will receive confirmation w/in 24-72 hrs.    Tarence Searcy PA-C

## 2017-02-08 NOTE — Progress Notes (Signed)
Cardiologist on call paged concerning patient's b/p tonight. MD notified that patient stated that he wants to sleep and rechecking his pressures have been keeping him awake. MD told RN that with patient asymptomatic, he is ok to sleep at this time- no further orders for b/p needed per MD.

## 2017-02-08 NOTE — Anesthesia Postprocedure Evaluation (Signed)
Anesthesia Post Note  Patient: Garrett RainbowMark J Storie  Procedure(s) Performed: CARDIOVERSION (N/A )     Patient location during evaluation: PACU Anesthesia Type: General Level of consciousness: awake and alert Pain management: pain level controlled Vital Signs Assessment: post-procedure vital signs reviewed and stable Respiratory status: spontaneous breathing, nonlabored ventilation, respiratory function stable and patient connected to nasal cannula oxygen Cardiovascular status: blood pressure returned to baseline and stable Postop Assessment: no apparent nausea or vomiting Anesthetic complications: no    Last Vitals:  Vitals:   02/08/17 0656 02/08/17 1015  BP: (!) 134/104 (!) 142/97  Pulse: 65 65  Resp: 16   Temp: 36.4 C   SpO2: 100%     Last Pain:  Vitals:   02/08/17 0855  TempSrc:   PainSc: 0-No pain   Pain Goal: Patients Stated Pain Goal: 0 (02/08/17 0855)               Cristela BlueJACKSON,Marcoantonio Legault EDWARD

## 2017-02-11 ENCOUNTER — Ambulatory Visit: Payer: BLUE CROSS/BLUE SHIELD | Admitting: Interventional Cardiology

## 2017-02-12 ENCOUNTER — Telehealth: Payer: Self-pay

## 2017-02-12 NOTE — Telephone Encounter (Signed)
**Note De-Identified Garrett Hanson Obfuscation** Approval on Dofetilide received Garrett Hanson fax from [facility]. Approval good from 02/08/2017 until 02/09/2019.

## 2017-02-15 ENCOUNTER — Ambulatory Visit (HOSPITAL_COMMUNITY)
Admit: 2017-02-15 | Discharge: 2017-02-15 | Disposition: A | Discharge status: Home / Self Care | Payer: BLUE CROSS/BLUE SHIELD | Attending: Nurse Practitioner | Admitting: Nurse Practitioner

## 2017-02-15 ENCOUNTER — Encounter (HOSPITAL_COMMUNITY): Payer: Self-pay | Admitting: Nurse Practitioner

## 2017-02-15 ENCOUNTER — Telehealth: Payer: Self-pay | Admitting: Cardiovascular Disease

## 2017-02-15 ENCOUNTER — Other Ambulatory Visit (HOSPITAL_COMMUNITY): Payer: Self-pay | Admitting: *Deleted

## 2017-02-15 VITALS — BP 170/110 | HR 62 | Ht 74.0 in | Wt 296.0 lb

## 2017-02-15 DIAGNOSIS — Z808 Family history of malignant neoplasm of other organs or systems: Secondary | ICD-10-CM | POA: Insufficient documentation

## 2017-02-15 DIAGNOSIS — Z87442 Personal history of urinary calculi: Secondary | ICD-10-CM | POA: Diagnosis not present

## 2017-02-15 DIAGNOSIS — Z9889 Other specified postprocedural states: Secondary | ICD-10-CM | POA: Insufficient documentation

## 2017-02-15 DIAGNOSIS — I129 Hypertensive chronic kidney disease with stage 1 through stage 4 chronic kidney disease, or unspecified chronic kidney disease: Secondary | ICD-10-CM | POA: Diagnosis not present

## 2017-02-15 DIAGNOSIS — E669 Obesity, unspecified: Secondary | ICD-10-CM | POA: Insufficient documentation

## 2017-02-15 DIAGNOSIS — Z79899 Other long term (current) drug therapy: Secondary | ICD-10-CM | POA: Diagnosis not present

## 2017-02-15 DIAGNOSIS — I4819 Other persistent atrial fibrillation: Secondary | ICD-10-CM

## 2017-02-15 DIAGNOSIS — Z91041 Radiographic dye allergy status: Secondary | ICD-10-CM | POA: Insufficient documentation

## 2017-02-15 DIAGNOSIS — I481 Persistent atrial fibrillation: Secondary | ICD-10-CM | POA: Insufficient documentation

## 2017-02-15 DIAGNOSIS — Z7901 Long term (current) use of anticoagulants: Secondary | ICD-10-CM | POA: Diagnosis not present

## 2017-02-15 DIAGNOSIS — Z880 Allergy status to penicillin: Secondary | ICD-10-CM | POA: Insufficient documentation

## 2017-02-15 DIAGNOSIS — Z888 Allergy status to other drugs, medicaments and biological substances status: Secondary | ICD-10-CM | POA: Insufficient documentation

## 2017-02-15 DIAGNOSIS — Z96652 Presence of left artificial knee joint: Secondary | ICD-10-CM | POA: Insufficient documentation

## 2017-02-15 DIAGNOSIS — Z8249 Family history of ischemic heart disease and other diseases of the circulatory system: Secondary | ICD-10-CM | POA: Insufficient documentation

## 2017-02-15 DIAGNOSIS — J42 Unspecified chronic bronchitis: Secondary | ICD-10-CM | POA: Insufficient documentation

## 2017-02-15 DIAGNOSIS — G4733 Obstructive sleep apnea (adult) (pediatric): Secondary | ICD-10-CM | POA: Diagnosis not present

## 2017-02-15 DIAGNOSIS — N182 Chronic kidney disease, stage 2 (mild): Secondary | ICD-10-CM | POA: Insufficient documentation

## 2017-02-15 DIAGNOSIS — E7800 Pure hypercholesterolemia, unspecified: Secondary | ICD-10-CM | POA: Insufficient documentation

## 2017-02-15 DIAGNOSIS — Z803 Family history of malignant neoplasm of breast: Secondary | ICD-10-CM | POA: Insufficient documentation

## 2017-02-15 LAB — BASIC METABOLIC PANEL
ANION GAP: 8 (ref 5–15)
BUN: 11 mg/dL (ref 6–20)
CALCIUM: 8.8 mg/dL — AB (ref 8.9–10.3)
CO2: 26 mmol/L (ref 22–32)
Chloride: 106 mmol/L (ref 101–111)
Creatinine, Ser: 1.04 mg/dL (ref 0.61–1.24)
Glucose, Bld: 95 mg/dL (ref 65–99)
Potassium: 3.3 mmol/L — ABNORMAL LOW (ref 3.5–5.1)
Sodium: 140 mmol/L (ref 135–145)

## 2017-02-15 LAB — MAGNESIUM: MAGNESIUM: 2.1 mg/dL (ref 1.7–2.4)

## 2017-02-15 MED ORDER — AMLODIPINE BESYLATE 5 MG PO TABS: 2.5000 mg | ORAL_TABLET | Freq: Every day | ORAL | 3 refills | Status: DC

## 2017-02-15 MED ORDER — POTASSIUM CHLORIDE CRYS ER 20 MEQ PO TBCR: 20.0000 meq | EXTENDED_RELEASE_TABLET | Freq: Two times a day (BID) | ORAL | 6 refills | Status: DC

## 2017-02-15 NOTE — Telephone Encounter (Signed)
Called patient and LVM to call back to schedule as a new patient with Dr. Royann Shiversroitoru in 3 months.

## 2017-02-15 NOTE — Progress Notes (Signed)
Primary Care Physician: Daisy Floro, MD Referring Physician: [facility] Cardiologist: Dr. Royann Shivers  (pending establishing)   Garrett Hanson is a 56 y.o. male with a h/o HTN, obesity, recently diagnosed sleep apnea, started CPAP 4 weeks ago. He was found to be in rate controlled afib at time of physical mid summer and was sent to Cardiology. He was set up for cardioversion, 9/24,  and unfortunately would not shock out. He came into  the afib clinic for other options.  He reports that 20 years ago he had a couple of episodes of rapid heart rate and possibly Dr. Graciela Husbands, can't for sure remember the name, did cardiac mapping, but no ablation was done and since this was a very infrequent thing, no other treatment was indicated at the time. Now, pt thinks he could have been in afib for a year, as it was not present on his last physical exam. He may have had some intermittent episodes before that. He went to [place] in May and was more short of breath when they were out exploring the area. Echo showed severe enlargement of left and right atrium, 47 mm with volume of 93. of . He is more symptomatic with fatigue, dyspnea, hard to do his usual activities.  F/u in afib clinic 10/26, f/u tikosyn start, he is staying in SR, feels some better, but he has just been in SR x one week and has just been using his CPAP for a few weeks, so he anticipates that he will be feeling better with time. He is very serious that he wants to lose weight and become more active.  Today, he denies symptoms of palpitations, chest pain,   orthopnea, PND, lower extremity edema, dizziness, presyncope, syncope, or neurologic sequela. positive for fatigue and mild shortness of breath. The patient is tolerating medications without difficulties and is otherwise without complaint today.   Past Medical History:  Diagnosis Date  . Arthritis    "left knee; probably in the shoulders; left hip" (02/05/2017)  . Chronic bronchitis  (HCC)    "get it ~ q other year" (02/05/2017)  . Chronic urticaria   . CKD (chronic kidney disease), stage II    pt denies this hx on 02/05/2017  . Complication of anesthesia    pt states awaken during colonscopy   . Essential hypertension   . History of blood transfusion 1961  . History of common duodenal ulcer 1980  . History of kidney stones   . Lymphadenopathy    a. evaluated by heme-onc 04/2016, under surveillance.  . OSA on CPAP dx'd ~ 09/2016  . Persistent atrial fibrillation (HCC)   . Pneumonia    "X2" (02/05/2017)  . Pure hypercholesterolemia    Past Surgical History:  Procedure Laterality Date  . ANTERIOR CERVICAL DECOMP/DISCECTOMY FUSION  04/2005   "used piece from my hip"  . BACK SURGERY    . CARDIOVERSION N/A 01/14/2017   Procedure: CARDIOVERSION;  Surgeon: Jake Bathe, MD;  Location: [facility] ENDOSCOPY;  Service: Cardiovascular;  Laterality: N/A;  . CARDIOVERSION N/A 02/07/2017   Procedure: CARDIOVERSION;  Surgeon: Chilton Si, MD;  Location: [facility] ENDOSCOPY;  Service: Cardiovascular;  Laterality: N/A;  . COLONOSCOPY W/ BIOPSIES AND POLYPECTOMY  ~ 2012   "benign"  . ELBOW SURGERY Right    "Tommy John surgery"  . EXTRACORPOREAL SHOCK WAVE LITHOTRIPSY  1990s  . INGUINAL HERNIA REPAIR Right 1980  . JOINT REPLACEMENT    . KNEE ARTHROSCOPY Right ~ 1990  .  SHOULDER ARTHROSCOPY W/ ROTATOR CUFF REPAIR Right 2000s X 2  . TONSILLECTOMY     "I think I did"  . TOTAL KNEE ARTHROPLASTY Left 02/07/2015   Procedure: LEFT TOTAL KNEE ARTHROPLASTY;  Surgeon: Durene RomansMatthew Olin, MD;  Location: [facility] ORS;  Service: Orthopedics;  Laterality: Left;    Current Outpatient Prescriptions  Medication Sig Dispense Refill  . apixaban (ELIQUIS) 5 MG TABS tablet Take 1 tablet (5 mg total) by mouth 2 (two) times daily. 60 tablet 1  . atorvastatin (LIPITOR) 40 MG tablet Take 40 mg by mouth every evening.    . dofetilide (TIKOSYN) 500 MCG capsule Take 1 capsule (500 mcg total) by mouth 2 (two) times  daily. 180 capsule 3  . famotidine (PEPCID) 40 MG tablet Take 40 mg by mouth 2 (two) times daily.    Marland Kitchen. lactobacillus acidophilus (BACID) TABS tablet Take 1 tablet by mouth daily.    Marland Kitchen. losartan (COZAAR) 100 MG tablet Take 100 mg by mouth every morning.    . metoprolol tartrate (LOPRESSOR) 25 MG tablet Take 25 mg by mouth 2 (two) times daily.    Marland Kitchen. amLODipine (NORVASC) 5 MG tablet Take 0.5 tablets (2.5 mg total) by mouth daily. 30 tablet 3  . fluticasone (VERAMYST) 27.5 MCG/SPRAY nasal spray Place 1 spray into the nose every morning.    . hydrocortisone cream 1 % Apply topically every 6 (six) hours as needed for itching. 30 g 0  . methocarbamol (ROBAXIN) 500 MG tablet Take 1 tablet (500 mg total) by mouth every 6 (six) hours as needed for muscle spasms. 50 tablet 0  . nystatin (MYCOSTATIN/NYSTOP) powder Apply 1 application topically 2 (two) times daily as needed.  5   No current facility-administered medications for this encounter.     Allergies  Allergen Reactions  . Contrast Media [Iodinated Diagnostic Agents] Shortness Of Breath and Palpitations    Broke out in sweats.  . Penicillins Anaphylaxis    Has patient had a PCN reaction causing immediate rash, facial/tongue/throat swelling, SOB or lightheadedness with hypotension: Yes Has patient had a PCN reaction causing severe rash involving mucus membranes or skin necrosis: No Has patient had a PCN reaction that required hospitalization:Yes Has patient had a PCN reaction occurring within the last 10 years: No If all of the above answers are "NO", then may proceed with Cephalosporin use.   Marland Kitchen. Singulair [Montelukast Sodium] Swelling    Lips, mouth, and hand swelling     Social History   Social History  . Marital status: Married    Spouse name: N/A  . Number of children: N/A  . Years of education: N/A   Occupational History  . Not on file.   Social History Main Topics  . Smoking status: Never Smoker  . Smokeless tobacco: Never Used   . Alcohol use Yes     Comment: 02/05/2017 "2-4 glasses of wine/month"  . Drug use: No  . Sexual activity: Yes   Other Topics Concern  . Not on file   Social History Narrative   [place] in [place].  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    Family History  Problem Relation Age of Onset  . Hypertension Mother   . Hyperlipidemia Mother   . Breast cancer Mother   . Uterine cancer Mother   . Sleep apnea Mother   . Hypertension Brother   . Hyperlipidemia Brother   . Cancer Brother     ROS- All systems are reviewed  and negative except as per the HPI above  Physical Exam: Vitals:   02/15/17 0924  BP: (!) 170/110  Pulse: 62  Weight: 296 lb (134.3 kg)  Height: 6\' 2"  (1.88 m)   Wt Readings from Last 3 Encounters:  02/15/17 296 lb (134.3 kg)  02/07/17 296 lb 11.2 oz (134.6 kg)  02/05/17 296 lb 12.8 oz (134.6 kg)    Labs: Lab Results  Component Value Date   NA 140 02/15/2017   K 3.3 (L) 02/15/2017   CL 106 02/15/2017   CO2 26 02/15/2017   GLUCOSE 95 02/15/2017   BUN 11 02/15/2017   CREATININE 1.04 02/15/2017   CALCIUM 8.8 (L) 02/15/2017   MG 2.1 02/15/2017   Lab Results  Component Value Date   INR 1.1 12/31/2016   No results found for: CHOL, HDL, LDLCALC, TRIG   GEN- The patient is well appearing, alert and oriented x 3 today.   Head- normocephalic, atraumatic Eyes-  Sclera clear, conjunctiva pink Ears- hearing intact Oropharynx- clear Neck- supple, no JVP Lymph- no cervical lymphadenopathy Lungs- Clear to ausculation bilaterally, normal work of breathing Heart- irregular rate and rhythm, no murmurs, rubs or gallops, PMI not laterally displaced GI- soft, NT, ND, + BS Extremities- no clubbing, cyanosis, or edema MS- no significant deformity or atrophy Skin- no rash or lesion Psych- euthymic mood, full affect Neuro- strength and sensation are intact  EKG-SR at 62 bpm, pr int 158 ms, qrs int 108 ms, qtc 507 ms, corrected by  Dr.Allred at 480 ms. Epic records reviewed Echo-Study Conclusions  - Left ventricle: The cavity size was normal. There was mild   concentric hypertrophy. Systolic function was normal. The   estimated ejection fraction was in the range of 60% to 65%. Wall   motion was normal; there were no regional wall motion   abnormalities. - Aortic valve: Transvalvular velocity was within the normal range.   There was no stenosis. There was no regurgitation. - Aorta: Ascending aortic diameter: 38 mm (S). - Ascending aorta: The ascending aorta was mildly dilated. - Mitral valve: Transvalvular velocity was within the normal range.   There was no evidence for stenosis. There was trivial   regurgitation. - Left atrium: The atrium was severely dilated. - Right ventricle: The cavity size was normal. Wall thickness was   normal. Systolic function was normal. - Right atrium: The atrium was severely dilated. - Tricuspid valve: There was trivial regurgitation.   Assessment and Plan: 1. Persistent symptomatic  afib Has c/o of fatigue and shortness of breath with activities, some improved Unfortunately failed cardioversion 12/31/16 He is now on Tikosyn and staying in SR Qtc long at 507, checked by Dr Johney Frame and he feels than it is around 480 ms Bmet/mag today  Off HCTZ, for tikosyn start, BP elevated at 170/110 Start amlodipine at 5 mg, 1/2 tab  qd Continue cpap Continue eliquis 5 mg bid  for chadsvasc score of 1, no missed doses No benadryl use   Labs today showed a K+ at 3.3, mag in range, will start 20 meq k+ bid, will recheck of EKG, BP and repeat labs in one week    Lupita Leash C. [facility] [address] [place], [place] [address] [phone]

## 2017-02-15 NOTE — Patient Instructions (Signed)
Your physician has recommended you make the following change in your medication:  1)Amlodipine 2.5mg  (1/2 tablet of the 5mg ) once a day

## 2017-02-22 ENCOUNTER — Ambulatory Visit (HOSPITAL_COMMUNITY)
Admission: RE | Admit: 2017-02-22 | Discharge: 2017-02-22 | Disposition: A | Discharge status: Home / Self Care | Payer: BLUE CROSS/BLUE SHIELD | Source: Ambulatory Visit | Attending: Nurse Practitioner | Admitting: Nurse Practitioner

## 2017-02-22 ENCOUNTER — Ambulatory Visit: Payer: BLUE CROSS/BLUE SHIELD | Admitting: Cardiology

## 2017-02-22 ENCOUNTER — Encounter (HOSPITAL_COMMUNITY): Payer: Self-pay | Admitting: Nurse Practitioner

## 2017-02-22 DIAGNOSIS — I4891 Unspecified atrial fibrillation: Secondary | ICD-10-CM | POA: Insufficient documentation

## 2017-02-22 DIAGNOSIS — R9431 Abnormal electrocardiogram [ECG] [EKG]: Secondary | ICD-10-CM | POA: Diagnosis not present

## 2017-02-22 DIAGNOSIS — Z01818 Encounter for other preprocedural examination: Secondary | ICD-10-CM | POA: Diagnosis not present

## 2017-02-22 LAB — BASIC METABOLIC PANEL
ANION GAP: 6 (ref 5–15)
BUN: 12 mg/dL (ref 6–20)
CALCIUM: 9 mg/dL (ref 8.9–10.3)
CO2: 26 mmol/L (ref 22–32)
Chloride: 106 mmol/L (ref 101–111)
Creatinine, Ser: 1.04 mg/dL (ref 0.61–1.24)
GLUCOSE: 101 mg/dL — AB (ref 65–99)
Potassium: 3.6 mmol/L (ref 3.5–5.1)
SODIUM: 138 mmol/L (ref 135–145)

## 2017-02-22 MED ORDER — SPIRONOLACTONE 25 MG PO TABS: 12.5000 mg | ORAL_TABLET | Freq: Every day | ORAL | 3 refills | Status: DC

## 2017-02-22 NOTE — Progress Notes (Signed)
Patient ID: Garrett Hanson, male   DOB: 04/11/1961, 56 y.o.   MRN: [id number]   NSR at 62 bpm, qtc improved at 497 ms. BP at 170/110, improved with rest 146/80. Continue with amlodipine 5 mg qd, bmet today. IF K+ still low with today lab , with elevated BP, will consider adding spironolactone  F/u in one week 

## 2017-02-28 ENCOUNTER — Ambulatory Visit (HOSPITAL_COMMUNITY)
Admission: RE | Admit: 2017-02-28 | Discharge: 2017-02-28 | Disposition: A | Discharge status: Home / Self Care | Payer: BLUE CROSS/BLUE SHIELD | Source: Ambulatory Visit | Attending: Nurse Practitioner | Admitting: Nurse Practitioner

## 2017-02-28 ENCOUNTER — Other Ambulatory Visit (HOSPITAL_COMMUNITY): Payer: Self-pay | Admitting: *Deleted

## 2017-02-28 ENCOUNTER — Encounter (HOSPITAL_COMMUNITY): Payer: Self-pay | Admitting: Nurse Practitioner

## 2017-02-28 DIAGNOSIS — R9431 Abnormal electrocardiogram [ECG] [EKG]: Secondary | ICD-10-CM | POA: Insufficient documentation

## 2017-02-28 DIAGNOSIS — Z0130 Encounter for examination of blood pressure without abnormal findings: Secondary | ICD-10-CM | POA: Diagnosis not present

## 2017-02-28 LAB — BASIC METABOLIC PANEL
ANION GAP: 7 (ref 5–15)
BUN: 11 mg/dL (ref 6–20)
CHLORIDE: 105 mmol/L (ref 101–111)
CO2: 25 mmol/L (ref 22–32)
Calcium: 9 mg/dL (ref 8.9–10.3)
Creatinine, Ser: 1.07 mg/dL (ref 0.61–1.24)
GFR calc non Af Amer: >60 mL/min (ref >60)
GLUCOSE: 98 mg/dL (ref 65–99)
Potassium: 3.7 mmol/L (ref 3.5–5.1)
Sodium: 137 mmol/L (ref 135–145)

## 2017-02-28 MED ORDER — SPIRONOLACTONE 25 MG PO TABS: 25.0000 mg | ORAL_TABLET | Freq: Every day | ORAL | 3 refills | Status: DC

## 2017-02-28 NOTE — Progress Notes (Signed)
Patient ID: Garrett Hanson, male   DOB: 1960-12-19, 56 y.o.   MRN: [id number]   Pt in for BP check, bmet and EKG  Has been having issues with BP since hctz stopped and Tikosyn started. He was placed on amlodipine and spironolactone for better BP control and  to encourage higher k+ levels. EKG shows SR with qtc 474 ms. BP initially at 156 systolic, recheck by me at 140 /88. Await results of bmet to determine increase spironolactone.

## 2017-02-28 NOTE — Progress Notes (Signed)
Pt in for EKG/BP check.  BMET will be drawn.  TO be reviewed by Rivka Saferonna Carroll,NP

## 2017-03-11 ENCOUNTER — Ambulatory Visit (HOSPITAL_COMMUNITY)
Admission: RE | Admit: 2017-03-11 | Discharge: 2017-03-11 | Disposition: A | Discharge status: Home / Self Care | Payer: BLUE CROSS/BLUE SHIELD | Source: Ambulatory Visit | Attending: Nurse Practitioner | Admitting: Nurse Practitioner

## 2017-03-11 DIAGNOSIS — I481 Persistent atrial fibrillation: Secondary | ICD-10-CM | POA: Insufficient documentation

## 2017-03-11 LAB — BASIC METABOLIC PANEL
ANION GAP: 5 (ref 5–15)
BUN: 13 mg/dL (ref 6–20)
CHLORIDE: 104 mmol/L (ref 101–111)
CO2: 30 mmol/L (ref 22–32)
Calcium: 9.5 mg/dL (ref 8.9–10.3)
Creatinine, Ser: 1.09 mg/dL (ref 0.61–1.24)
GFR calc Af Amer: >60 mL/min (ref >60)
GFR calc non Af Amer: >60 mL/min (ref >60)
GLUCOSE: 107 mg/dL — AB (ref 65–99)
POTASSIUM: 4.3 mmol/L (ref 3.5–5.1)
SODIUM: 139 mmol/L (ref 135–145)

## 2017-03-18 DIAGNOSIS — G4733 Obstructive sleep apnea (adult) (pediatric): Secondary | ICD-10-CM | POA: Diagnosis not present

## 2017-03-29 ENCOUNTER — Ambulatory Visit (HOSPITAL_COMMUNITY)
Admission: RE | Admit: 2017-03-29 | Discharge: 2017-03-29 | Disposition: A | Discharge status: Home / Self Care | Payer: BLUE CROSS/BLUE SHIELD | Source: Ambulatory Visit | Attending: Nurse Practitioner | Admitting: Nurse Practitioner

## 2017-03-29 ENCOUNTER — Encounter (HOSPITAL_COMMUNITY): Payer: Self-pay | Admitting: Nurse Practitioner

## 2017-03-29 VITALS — BP 118/80 | HR 71 | Ht 74.0 in | Wt 292.6 lb

## 2017-03-29 DIAGNOSIS — J42 Unspecified chronic bronchitis: Secondary | ICD-10-CM | POA: Insufficient documentation

## 2017-03-29 DIAGNOSIS — I4891 Unspecified atrial fibrillation: Secondary | ICD-10-CM | POA: Diagnosis not present

## 2017-03-29 DIAGNOSIS — I481 Persistent atrial fibrillation: Secondary | ICD-10-CM | POA: Diagnosis not present

## 2017-03-29 DIAGNOSIS — Z79899 Other long term (current) drug therapy: Secondary | ICD-10-CM | POA: Diagnosis not present

## 2017-03-29 DIAGNOSIS — N182 Chronic kidney disease, stage 2 (mild): Secondary | ICD-10-CM | POA: Insufficient documentation

## 2017-03-29 DIAGNOSIS — I4819 Other persistent atrial fibrillation: Secondary | ICD-10-CM

## 2017-03-29 DIAGNOSIS — Z880 Allergy status to penicillin: Secondary | ICD-10-CM | POA: Diagnosis not present

## 2017-03-29 DIAGNOSIS — Z7901 Long term (current) use of anticoagulants: Secondary | ICD-10-CM | POA: Insufficient documentation

## 2017-03-29 DIAGNOSIS — Z96652 Presence of left artificial knee joint: Secondary | ICD-10-CM | POA: Insufficient documentation

## 2017-03-29 DIAGNOSIS — I129 Hypertensive chronic kidney disease with stage 1 through stage 4 chronic kidney disease, or unspecified chronic kidney disease: Secondary | ICD-10-CM | POA: Insufficient documentation

## 2017-03-29 DIAGNOSIS — E7800 Pure hypercholesterolemia, unspecified: Secondary | ICD-10-CM | POA: Diagnosis not present

## 2017-03-29 DIAGNOSIS — Z87442 Personal history of urinary calculi: Secondary | ICD-10-CM | POA: Insufficient documentation

## 2017-03-29 DIAGNOSIS — E669 Obesity, unspecified: Secondary | ICD-10-CM | POA: Insufficient documentation

## 2017-03-29 DIAGNOSIS — G4733 Obstructive sleep apnea (adult) (pediatric): Secondary | ICD-10-CM | POA: Diagnosis not present

## 2017-03-29 LAB — BASIC METABOLIC PANEL
Anion gap: 7 (ref 5–15)
BUN: 18 mg/dL (ref 6–20)
CO2: 23 mmol/L (ref 22–32)
Calcium: 9.1 mg/dL (ref 8.9–10.3)
Chloride: 106 mmol/L (ref 101–111)
Creatinine, Ser: 1.13 mg/dL (ref 0.61–1.24)
Glucose, Bld: 96 mg/dL (ref 65–99)
POTASSIUM: 4 mmol/L (ref 3.5–5.1)
SODIUM: 136 mmol/L (ref 135–145)

## 2017-03-29 LAB — MAGNESIUM: MAGNESIUM: 1.8 mg/dL (ref 1.7–2.4)

## 2017-03-29 NOTE — Progress Notes (Addendum)
Primary Care Physician: Garrett Hanson, Garrett Alan, MD Referring Physician: [facility] Cardiologist: Garrett Hanson  (pending establishing)   Garrett FendtMark J Hanson is a 56 y.o. male with a h/o HTN, obesity, recently diagnosed sleep apnea, started CPAP 4 weeks ago. He was found to be in rate controlled afib at time of physical mid summer and was sent to Cardiology. He was set up for cardioversion, 9/24,  and unfortunately would not shock out. He came into  the afib clinic for other options.  He reports that 20 years ago he had a couple of episodes of rapid heart rate and possibly Dr. Graciela Hanson, can't for sure remember the name, did cardiac mapping, but no ablation was done and since this was a very infrequent thing, no other treatment was indicated at the time. Now, pt thinks he could have been in afib for a year, as it was not present on his last physical exam. He may have had some intermittent episodes before that. He went to [place] in May and was more short of breath when they were out exploring the area. Echo showed severe enlargement of left and right atrium, 47 mm with volume of 93. of . He is more symptomatic with fatigue, dyspnea, hard to do his usual activities.  F/u in afib clinic 10/26, f/u tikosyn start, he is staying in SR, feels some better, but he has just been in SR x one week and has just been using his CPAP for a few weeks, so he anticipates that he will be feeling better with time. He is very serious that he wants to lose weight and become more active.  F/u in afib clinic 12/7, one month after tikosyn start. He is staying in SR. Bp has improved on spironolactone and K+ levels improved. He feels well. Being complaint with CPAP and Tikosyn, apixaban.  Today, he denies symptoms of palpitations, chest pain,   orthopnea, PND, lower extremity edema, dizziness, presyncope, syncope, or neurologic sequela. positive for fatigue and mild shortness of breath. The patient is tolerating medications without  difficulties and is otherwise without complaint today.   Past Medical History:  Diagnosis Date  . Arthritis    "left knee; probably in the shoulders; left hip" (02/05/2017)  . Chronic bronchitis (HCC)    "get it ~ q other year" (02/05/2017)  . Chronic urticaria   . CKD (chronic kidney disease), stage II    pt denies this hx on 02/05/2017  . Complication of anesthesia    pt states awaken during colonscopy   . Essential hypertension   . History of blood transfusion 1961  . History of common duodenal ulcer 1980  . History of kidney stones   . Lymphadenopathy    a. evaluated by heme-onc 04/2016, under surveillance.  . OSA on CPAP dx'd ~ 09/2016  . Persistent atrial fibrillation (HCC)   . Pneumonia    "X2" (02/05/2017)  . Pure hypercholesterolemia    Past Surgical History:  Procedure Laterality Date  . ANTERIOR CERVICAL DECOMP/DISCECTOMY FUSION  04/2005   "used piece from my hip"  . BACK SURGERY    . CARDIOVERSION N/A 01/14/2017   Procedure: CARDIOVERSION;  Surgeon: Garrett Hanson, Garrett C, MD;  [facility];  Service: Cardiovascular;  Laterality: N/A;  . CARDIOVERSION N/A 02/07/2017   Procedure: CARDIOVERSION;  Surgeon: Garrett Hanson, Tiffany, MD;  [facility];  Service: Cardiovascular;  Laterality: N/A;  . COLONOSCOPY W/ BIOPSIES AND POLYPECTOMY  ~ 2012   "benign"  . ELBOW SURGERY Right    "  Garrett Hanson surgery"  . EXTRACORPOREAL SHOCK WAVE LITHOTRIPSY  1990s  . INGUINAL HERNIA REPAIR Right 1980  . JOINT REPLACEMENT    . KNEE ARTHROSCOPY Right ~ 1990  . SHOULDER ARTHROSCOPY W/ ROTATOR CUFF REPAIR Right 2000s X 2  . TONSILLECTOMY     "I think I did"  . TOTAL KNEE ARTHROPLASTY Left 02/07/2015   Procedure: LEFT TOTAL KNEE ARTHROPLASTY;  Surgeon: Durene Romans, MD;  Location: [facility] ORS;  Service: Orthopedics;  Laterality: Left;    Current Outpatient Medications  Medication Sig Dispense Refill  . amLODipine (NORVASC) 5 MG tablet Take 0.5 tablets (2.5 mg total) by mouth daily.  30 tablet 3  . apixaban (ELIQUIS) 5 MG TABS tablet Take 1 tablet (5 mg total) by mouth 2 (two) times daily. 60 tablet 1  . atorvastatin (LIPITOR) 40 MG tablet Take 40 mg by mouth every evening.    . dofetilide (TIKOSYN) 500 MCG capsule Take 1 capsule (500 mcg total) by mouth 2 (two) times daily. 180 capsule 3  . famotidine (PEPCID) 40 MG tablet Take 40 mg by mouth 2 (two) times daily.    . fluticasone (VERAMYST) 27.5 MCG/SPRAY nasal spray Place 1 spray into the nose every morning.    . hydrocortisone cream 1 % Apply topically every 6 (six) hours as needed for itching. 30 g 0  . lactobacillus acidophilus (BACID) TABS tablet Take 1 tablet by mouth daily.    Marland Kitchen losartan (COZAAR) 100 MG tablet Take 100 mg by mouth every morning.    . methocarbamol (ROBAXIN) 500 MG tablet Take 1 tablet (500 mg total) by mouth every 6 (six) hours as needed for muscle spasms. 50 tablet 0  . metoprolol tartrate (LOPRESSOR) 25 MG tablet Take 25 mg by mouth 2 (two) times daily.    Marland Kitchen nystatin (MYCOSTATIN/NYSTOP) powder Apply 1 application topically 2 (two) times daily as needed.  5  . potassium chloride SA (K-DUR,KLOR-CON) 20 MEQ tablet Take 1 tablet (20 mEq total) by mouth 2 (two) times daily. 60 tablet 6  . spironolactone (ALDACTONE) 25 MG tablet Take 1 tablet (25 mg total) daily by mouth. 30 tablet 3   No current facility-administered medications for this encounter.     Allergies  Allergen Reactions  . Contrast Media [Iodinated Diagnostic Agents] Shortness Of Breath and Palpitations    Broke out in sweats.  . Penicillins Anaphylaxis    Has patient had a PCN reaction causing immediate rash, facial/tongue/throat swelling, SOB or lightheadedness with hypotension: Yes Has patient had a PCN reaction causing severe rash involving mucus membranes or skin necrosis: No Has patient had a PCN reaction that required hospitalization:Yes Has patient had a PCN reaction occurring within the last 10 years: No If all of the above  answers are "NO", then may proceed with Cephalosporin use.   Marland Kitchen Singulair [Montelukast Sodium] Swelling    Lips, mouth, and hand swelling     Social History   Socioeconomic History  . Marital status: Married    Spouse name: Not on file  . Number of children: Not on file  . Years of education: Not on file  . Highest education level: Not on file  Social Needs  . Financial resource strain: Not on file  . Food insecurity - worry: Not on file  . Food insecurity - inability: Not on file  . Transportation needs - medical: Not on file  . Transportation needs - non-medical: Not on file  Occupational History  . Not on file  Tobacco Use  .  Smoking status: Never Smoker  . Smokeless tobacco: Never Used  Substance and Sexual Activity  . Alcohol use: Yes    Comment: 02/05/2017 "2-4 glasses of wine/month"  . Drug use: No  . Sexual activity: Yes  Other Topics Concern  . Not on file  Social History Narrative   Pt lives in [place].  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    Family History  Problem Relation Age of Onset  . Hypertension Mother   . Hyperlipidemia Mother   . Breast cancer Mother   . Uterine cancer Mother   . Sleep apnea Mother   . Hypertension Brother   . Hyperlipidemia Brother   . Cancer Brother     ROS- All systems are reviewed and negative except as per the HPI above  Physical Exam: Vitals:   03/29/17 1009  Weight: 292 lb 9.6 oz (132.7 kg)  Height: 6\' 2"  (1.88 m)   Wt Readings from Last 3 Encounters:  03/29/17 292 lb 9.6 oz (132.7 kg)  02/22/17 294 lb 6 oz (133.5 kg)  02/15/17 296 lb (134.3 kg)    Labs: Lab Results  Component Value Date   NA 139 03/11/2017   K 4.3 03/11/2017   CL 104 03/11/2017   CO2 30 03/11/2017   GLUCOSE 107 (H) 03/11/2017   BUN 13 03/11/2017   CREATININE 1.09 03/11/2017   CALCIUM 9.5 03/11/2017   MG 2.1 02/15/2017   Lab Results  Component Value Date   INR 1.1 12/31/2016   No results found for:  CHOL, HDL, LDLCALC, TRIG   GEN- The patient is well appearing, alert and oriented x 3 today.   Head- normocephalic, atraumatic Eyes-  Sclera clear, conjunctiva pink Ears- hearing intact Oropharynx- clear Neck- supple, no JVP Lymph- no cervical lymphadenopathy Lungs- Clear to ausculation bilaterally, normal work of breathing Heart- regular rate and rhythm, no murmurs, rubs or gallops, PMI not laterally displaced GI- soft, NT, ND, + BS Extremities- no clubbing, cyanosis, or edema MS- no significant deformity or atrophy Skin- no rash or lesion Psych- euthymic mood, full affect Neuro- strength and sensation are intact  EKG-SR at 71 bpm, pr int 162 ms, qrs int 104 ms, qtc 471 ms Epic records reviewed Echo-Study Conclusions  - Left ventricle: The cavity size was normal. There was mild   concentric hypertrophy. Systolic function was normal. The   estimated ejection fraction was in the range of 60% to 65%. Wall   motion was normal; there were no regional wall motion   abnormalities. - Aortic valve: Transvalvular velocity was within the normal range.   There was no stenosis. There was no regurgitation. - Aorta: Ascending aortic diameter: 38 mm (S). - Ascending aorta: The ascending aorta was mildly dilated. - Mitral valve: Transvalvular velocity was within the normal range.   There was no evidence for stenosis. There was trivial   regurgitation. - Left atrium: The atrium was severely dilated. - Right ventricle: The cavity size was normal. Wall thickness was   normal. Systolic function was normal. - Right atrium: The atrium was severely dilated. - Tricuspid valve: There was trivial regurgitation.   Assessment and Plan: 1. Persistent symptomatic  afib  Maintaining  SR on dofetilide  Continue  500 mcg bid Qtc stable Continue CPAP Weight loss/exercise encouraged Continue eliquis 5 mg bid  for chadsvasc score of 1, no missed doses Bmet/mag today   2. HTN Well  controlled Continue amlodipine at 5 mg, 1/2 tab qd  Continue  spironolactone 25 mg qd, losartan 100 mg qd Continue eliquis 5 mg bid  for chadsvasc score of 1, no missed doses   F/u with Dr. Salena Saner as scheduled 1/24    Elvina Sidle. [facility] [address] [place], [place] [address] [phone]

## 2017-03-29 NOTE — Progress Notes (Signed)
Patient ID: Garrett Hanson, male   DOB: [birth year], 56 y.o.   MRN: [id number]   NSR at 62 bpm, qtc improved at 497 ms. BP at 170/110, improved with rest 146/80. Continue with amlodipine 5 mg qd, bmet today. IF K+ still low with today lab , with elevated BP, will consider adding spironolactone  F/u in one week

## 2017-04-01 DIAGNOSIS — G4733 Obstructive sleep apnea (adult) (pediatric): Secondary | ICD-10-CM | POA: Diagnosis not present

## 2017-04-04 ENCOUNTER — Other Ambulatory Visit (HOSPITAL_COMMUNITY): Payer: Self-pay | Admitting: *Deleted

## 2017-04-04 MED ORDER — AMLODIPINE BESYLATE 5 MG PO TABS: 2.5000 mg | ORAL_TABLET | Freq: Every day | ORAL | 3 refills | Status: DC

## 2017-04-04 MED ORDER — SPIRONOLACTONE 25 MG PO TABS: 25.0000 mg | ORAL_TABLET | Freq: Every day | ORAL | 3 refills | Status: DC

## 2017-04-04 MED ORDER — POTASSIUM CHLORIDE CRYS ER 20 MEQ PO TBCR: 20.0000 meq | EXTENDED_RELEASE_TABLET | Freq: Two times a day (BID) | ORAL | 3 refills | Status: DC

## 2017-04-04 MED ORDER — DOFETILIDE 500 MCG PO CAPS: 500.0000 ug | ORAL_CAPSULE | Freq: Two times a day (BID) | ORAL | 0 refills | Status: DC

## 2017-04-17 DIAGNOSIS — G4733 Obstructive sleep apnea (adult) (pediatric): Secondary | ICD-10-CM | POA: Diagnosis not present

## 2017-04-19 ENCOUNTER — Other Ambulatory Visit (HOSPITAL_COMMUNITY): Payer: Self-pay | Admitting: *Deleted

## 2017-04-19 MED ORDER — AMLODIPINE BESYLATE 2.5 MG PO TABS: 2.5000 mg | ORAL_TABLET | Freq: Every day | ORAL | 3 refills | Status: DC

## 2017-04-22 DIAGNOSIS — J42 Unspecified chronic bronchitis: Secondary | ICD-10-CM | POA: Insufficient documentation

## 2017-04-22 DIAGNOSIS — Z87442 Personal history of urinary calculi: Secondary | ICD-10-CM | POA: Insufficient documentation

## 2017-04-22 DIAGNOSIS — J189 Pneumonia, unspecified organism: Secondary | ICD-10-CM | POA: Insufficient documentation

## 2017-04-22 DIAGNOSIS — Z9989 Dependence on other enabling machines and devices: Secondary | ICD-10-CM

## 2017-04-22 DIAGNOSIS — I10 Essential (primary) hypertension: Secondary | ICD-10-CM | POA: Insufficient documentation

## 2017-04-22 DIAGNOSIS — R591 Generalized enlarged lymph nodes: Secondary | ICD-10-CM | POA: Insufficient documentation

## 2017-04-22 DIAGNOSIS — N182 Chronic kidney disease, stage 2 (mild): Secondary | ICD-10-CM | POA: Insufficient documentation

## 2017-04-22 DIAGNOSIS — T4145XA Adverse effect of unspecified anesthetic, initial encounter: Secondary | ICD-10-CM | POA: Insufficient documentation

## 2017-04-22 DIAGNOSIS — L508 Other urticaria: Secondary | ICD-10-CM | POA: Insufficient documentation

## 2017-04-22 DIAGNOSIS — G4733 Obstructive sleep apnea (adult) (pediatric): Secondary | ICD-10-CM | POA: Insufficient documentation

## 2017-04-22 DIAGNOSIS — E7800 Pure hypercholesterolemia, unspecified: Secondary | ICD-10-CM | POA: Insufficient documentation

## 2017-04-22 DIAGNOSIS — T8859XA Other complications of anesthesia, initial encounter: Secondary | ICD-10-CM | POA: Insufficient documentation

## 2017-04-22 DIAGNOSIS — M1990 Unspecified osteoarthritis, unspecified site: Secondary | ICD-10-CM | POA: Insufficient documentation

## 2017-04-26 ENCOUNTER — Other Ambulatory Visit: Payer: Self-pay | Admitting: Family Medicine

## 2017-04-26 ENCOUNTER — Ambulatory Visit
Admission: RE | Admit: 2017-04-26 | Discharge: 2017-04-26 | Disposition: A | Discharge status: Home / Self Care | Payer: BLUE CROSS/BLUE SHIELD | Source: Ambulatory Visit | Attending: Family Medicine | Admitting: Family Medicine

## 2017-04-26 DIAGNOSIS — M25552 Pain in left hip: Secondary | ICD-10-CM

## 2017-04-26 DIAGNOSIS — R600 Localized edema: Secondary | ICD-10-CM | POA: Diagnosis not present

## 2017-04-26 DIAGNOSIS — M1612 Unilateral primary osteoarthritis, left hip: Secondary | ICD-10-CM | POA: Diagnosis not present

## 2017-04-26 DIAGNOSIS — M79605 Pain in left leg: Secondary | ICD-10-CM | POA: Diagnosis not present

## 2017-05-09 DIAGNOSIS — M1612 Unilateral primary osteoarthritis, left hip: Secondary | ICD-10-CM | POA: Diagnosis not present

## 2017-05-09 DIAGNOSIS — M25552 Pain in left hip: Secondary | ICD-10-CM | POA: Diagnosis not present

## 2017-05-15 DIAGNOSIS — M1612 Unilateral primary osteoarthritis, left hip: Secondary | ICD-10-CM | POA: Diagnosis not present

## 2017-05-15 DIAGNOSIS — M25552 Pain in left hip: Secondary | ICD-10-CM | POA: Diagnosis not present

## 2017-05-16 ENCOUNTER — Telehealth: Payer: Self-pay | Admitting: Cardiovascular Disease

## 2017-05-16 ENCOUNTER — Encounter: Payer: Self-pay | Admitting: Cardiovascular Disease

## 2017-05-16 ENCOUNTER — Ambulatory Visit (INDEPENDENT_AMBULATORY_CARE_PROVIDER_SITE_OTHER): Payer: BLUE CROSS/BLUE SHIELD | Admitting: Cardiovascular Disease

## 2017-05-16 VITALS — BP 122/74 | HR 64 | Ht 74.0 in | Wt 293.8 lb

## 2017-05-16 DIAGNOSIS — I481 Persistent atrial fibrillation: Secondary | ICD-10-CM | POA: Diagnosis not present

## 2017-05-16 DIAGNOSIS — I10 Essential (primary) hypertension: Secondary | ICD-10-CM

## 2017-05-16 DIAGNOSIS — G4733 Obstructive sleep apnea (adult) (pediatric): Secondary | ICD-10-CM | POA: Diagnosis not present

## 2017-05-16 DIAGNOSIS — Z9989 Dependence on other enabling machines and devices: Secondary | ICD-10-CM | POA: Diagnosis not present

## 2017-05-16 DIAGNOSIS — Z01810 Encounter for preprocedural cardiovascular examination: Secondary | ICD-10-CM

## 2017-05-16 DIAGNOSIS — E7800 Pure hypercholesterolemia, unspecified: Secondary | ICD-10-CM | POA: Diagnosis not present

## 2017-05-16 DIAGNOSIS — I4819 Other persistent atrial fibrillation: Secondary | ICD-10-CM

## 2017-05-16 DIAGNOSIS — Z6837 Body mass index (BMI) 37.0-37.9, adult: Secondary | ICD-10-CM

## 2017-05-16 NOTE — Progress Notes (Signed)
Cardiology Office Note:    Date:  05/16/2017   ID:  Roseanna Rainbow Rodriges, DOB [birth year], MRN [id number]  PCP:  Daisy Floro, MD  Cardiologist:  Thurmon Fair, MD; Hillis Range, MD (EP)  Referring MD: Daisy Floro, MD   Chief Complaint  Patient presents with  . Establish Care    pt states no Sx---establishing care to follow AFIB    History of Present Illness:    SHAMAR ENGELMANN is a 57 y.o. male with a history of persistent atrial fibrillation that failed initial cardioversion but had successful return and maintenance of sinus rhythm after initiation of dofetilide therapy last October.  Additional problems include moderate to severe obesity and recently diagnosed obstructive sleep apnea.  He has had a previous left total knee replacement about 2 years ago and is contemplating left total hip replacement with Dr. Charlann Boxer in the near future.  He has seen Rudi Coco in the atrial fibrillation clinic several times and Dr. Johney Frame was his attending during hospitalization for initiation of dofetilide.  He wants to also establish follow-up with a general cardiologist.  He is compliant with treatment for CPAP and has noticed that he is much sharper in the mornings since then.  He did not have as much of an improvement in his stamina as he expected from treatment with CPAP, until he underwent cardioversion.  Now that he is back in normal rhythm he has a lot more energy.  Unfortunately he is limited by severe pain in his left hip.  He has been compliant with anticoagulation and has not had any bleeding problems.  In sinus rhythm on his most recent ECG the QTc interval was 471 ms.  Labs performed in early December showed potassium of 4.0 and a creatinine clearance of greater than 60 mL/minute.  The patient specifically denies any chest pain at rest exertion, dyspnea at rest or with exertion, orthopnea, paroxysmal nocturnal dyspnea, syncope, palpitations, focal neurological deficits, intermittent  claudication, lower extremity edema, unexplained weight gain, cough, hemoptysis or wheezing.  He has normal left ventricular systolic function by echo performed in August 2018.  His echocardiogram shows mild LVH and a mildly dilated ascending aorta (38 mm).  I think we need to take into account that he is a tall and very broad-framed gentleman. For his body habitus, the aortic measurements may actually be normal. He has severe dilation of both the right and left atrium.   Past Medical History:  Diagnosis Date  . Arthritis    "left knee; probably in the shoulders; left hip" (02/05/2017)  . Chronic bronchitis (HCC)    "get it ~ q other year" (02/05/2017)  . Chronic urticaria   . CKD (chronic kidney disease), stage II    pt denies this hx on 02/05/2017  . Complication of anesthesia    pt states awaken during colonscopy   . Essential hypertension   . History of blood transfusion 1961  . History of common duodenal ulcer 1980  . History of kidney stones   . Lymphadenopathy    a. evaluated by heme-onc 04/2016, under surveillance.  . OSA on CPAP dx'd ~ 09/2016  . Persistent atrial fibrillation (HCC)   . Pneumonia    "X2" (02/05/2017)  . Pure hypercholesterolemia     Past Surgical History:  Procedure Laterality Date  . ANTERIOR CERVICAL DECOMP/DISCECTOMY FUSION  04/2005   "used piece from my hip"  . BACK SURGERY    . CARDIOVERSION N/A 01/14/2017   Procedure: CARDIOVERSION;  Surgeon: Jake Bathe, MD;  Location: [facility] ENDOSCOPY;  Service: Cardiovascular;  Laterality: N/A;  . CARDIOVERSION N/A 02/07/2017   Procedure: CARDIOVERSION;  Surgeon: Chilton Si, MD;  Location: [facility] ENDOSCOPY;  Service: Cardiovascular;  Laterality: N/A;  . COLONOSCOPY W/ BIOPSIES AND POLYPECTOMY  ~ 2012   "benign"  . ELBOW SURGERY Right    "Tommy John surgery"  . EXTRACORPOREAL SHOCK WAVE LITHOTRIPSY  1990s  . INGUINAL HERNIA REPAIR Right 1980  . JOINT REPLACEMENT    . KNEE ARTHROSCOPY Right ~ 1990  .  SHOULDER ARTHROSCOPY W/ ROTATOR CUFF REPAIR Right 2000s X 2  . TONSILLECTOMY     "I think I did"  . TOTAL KNEE ARTHROPLASTY Left 02/07/2015   Procedure: LEFT TOTAL KNEE ARTHROPLASTY;  Surgeon: Durene Romans, MD;  Location: [facility] ORS;  Service: Orthopedics;  Laterality: Left;    Current Medications: Current Meds  Medication Sig  . amLODipine (NORVASC) 2.5 MG tablet Take 1 tablet (2.5 mg total) by mouth daily.  Marland Kitchen apixaban (ELIQUIS) 5 MG TABS tablet Take 1 tablet (5 mg total) by mouth 2 (two) times daily.  Marland Kitchen atorvastatin (LIPITOR) 40 MG tablet Take 40 mg by mouth every evening.  . dofetilide (TIKOSYN) 500 MCG capsule Take 1 capsule (500 mcg total) by mouth 2 (two) times daily.  . famotidine (PEPCID) 40 MG tablet Take 40 mg by mouth 2 (two) times daily.  . fluticasone (VERAMYST) 27.5 MCG/SPRAY nasal spray Place 1 spray into the nose every morning.  . hydrocortisone cream 1 % Apply topically every 6 (six) hours as needed for itching.  . lactobacillus acidophilus (BACID) TABS tablet Take 1 tablet by mouth daily.  Marland Kitchen losartan (COZAAR) 100 MG tablet Take 100 mg by mouth every morning.  . methocarbamol (ROBAXIN) 500 MG tablet Take 1 tablet (500 mg total) by mouth every 6 (six) hours as needed for muscle spasms.  . metoprolol tartrate (LOPRESSOR) 25 MG tablet Take 25 mg by mouth 2 (two) times daily.  Marland Kitchen nystatin (MYCOSTATIN/NYSTOP) powder Apply 1 application topically 2 (two) times daily as needed.  . potassium chloride SA (K-DUR,KLOR-CON) 20 MEQ tablet Take 1 tablet (20 mEq total) by mouth 2 (two) times daily.  Marland Kitchen spironolactone (ALDACTONE) 25 MG tablet Take 1 tablet (25 mg total) by mouth daily.     Allergies:   Contrast media [iodinated diagnostic agents]; Penicillins; and Singulair [montelukast sodium]   Social History   Socioeconomic History  . Marital status: Married    Spouse name: None  . Number of children: None  . Years of education: None  . Highest education level: None  Social Needs    . Financial resource strain: None  . Food insecurity - worry: None  . Food insecurity - inability: None  . Transportation needs - medical: None  . Transportation needs - non-medical: None  Occupational History  . None  Tobacco Use  . Smoking status: Never Smoker  . Smokeless tobacco: Never Used  Substance and Sexual Activity  . Alcohol use: Yes    Comment: 02/05/2017 "2-4 glasses of wine/month"  . Drug use: No  . Sexual activity: Yes  Other Topics Concern  . None  Social History Narrative   Pt lives in [place].  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     Family History: The patient's family history includes Breast cancer in his mother; Cancer in his brother; Hyperlipidemia in his brother and mother; Hypertension in his brother and mother; Sleep apnea in his  mother; Uterine cancer in his mother.  ROS:   Please see the history of present illness.     All other systems reviewed and are negative.  EKGs/Labs/Other Studies Reviewed:    The following studies were reviewed today: Notes from his hospitalization and cardioversion October 2018, notes from multiple visits in the atrial fibrillation clinic with Rudi Coco, NP.  EKG:  EKG is not ordered today.  The ekg ordered March 29, 2017 demonstrates normal sinus rhythm, normal tracing, QTC 471 ms  Recent Labs: 12/17/2016: TSH 3.330 12/31/2016: Hemoglobin 15.8; Platelets 225 03/29/2017: BUN 18; Creatinine, Ser 1.13; Magnesium 1.8; Potassium 4.0; Sodium 136  Recent Lipid Panel No results found for: CHOL, TRIG, HDL, CHOLHDL, VLDL, LDLCALC, LDLDIRECT  Physical Exam:    VS:  BP 122/74 (BP Location: Left Arm, Patient Position: Sitting, Cuff Size: Large)   Pulse 64   Ht 6\' 2"  (1.88 m)   Wt 293 lb 12.8 oz (133.3 kg)   BMI 37.72 kg/m     Wt Readings from Last 3 Encounters:  05/16/17 293 lb 12.8 oz (133.3 kg)  03/29/17 292 lb 9.6 oz (132.7 kg)  02/22/17 294 lb 6 oz (133.5 kg)     GEN: Moderate to  severely obese, well developed in no acute distress HEENT: Normal NECK: No JVD; No carotid bruits LYMPHATICS: No lymphadenopathy CARDIAC: RRR, no murmurs, rubs, gallops RESPIRATORY:  Clear to auscultation without rales, wheezing or rhonchi  ABDOMEN: Soft, non-tender, non-distended MUSCULOSKELETAL:  No edema; No deformity  SKIN: Warm and dry NEUROLOGIC:  Alert and oriented x 3 PSYCHIATRIC:  Normal affect   ASSESSMENT:    1. Persistent atrial fibrillation (HCC)   2. Preoperative cardiovascular examination   3. Essential hypertension   4. OSA on CPAP   5. Class 2 severe obesity due to excess calories with serious comorbidity and body mass index (BMI) of 37.0 to 37.9 in adult (HCC)   6. Hypercholesterolemia    PLAN:    In order of problems listed above:  1. AFib: He is successfully maintaining sinus rhythm on dofetilide.  I took this opportunity reminded him of the need for careful dosing of this medication every 12 hours and the need to avoid missing any doses.  Also reminded him that his electrocardiogram QT interval and labs should be reviewed at least every 6 months.  Discussed the multiple potential drug-drug interactions for this medication and need to carefully consult with his prescribing physician and pharmacist about the potential for excessive QT prolongation.  Well over a month has passed since his cardioversion so I think it is perfectly reasonable to interrupt his anticoagulation for planned surgery at this point.  He is very interested in a "long-term solution" for his atrial fibrillation.  He has heard about ablation.  He thought this might be a curative procedure, but we discussed the fact that atrial fibrillation can always recur after ablation and he will not be able to discontinue his anticoagulants or consider himself "cured" even if he has a successful ablation.  One advantage of the ablation is that it might allow him to discontinue use of potentially toxic antiarrhythmic  medications, at least for a period of time.  I encouraged him to discuss this with Dr. Johney Frame, but he should have his hip surgery first. 2. Preop CV eval: I think he is at low risk for any major cardiovascular complications with the planned hip surgery.  There is a real risk that he could return to atrial fibrillation in the  perioperative hyperadrenergic.Marland Kitchen.  His Eliquis should be stopped 48 hours before the planned surgery and resumed as soon as safe from a surgical/bleeding point of view.  It is also important that his dofetilide and metoprolol are not interrupted in the perioperative period. 3. HTN: Very well controlled on current medical regimen.  Avoid thiazide diuretics as long as he is on dofetilide. 4. OSA: Encouraged 100% compliance with CPAP therapy and also strongly encouraged efforts at weight loss.  He believes that he will be able to lose a lot of weight after he has his hip replacement. 5. Obesity: He reports that a large part of his weight gain has been related to his orthopedic problems over the last couple of years. 6. HLP: On statin therapy.  No recent results are available for review.   Medication Adjustments/Labs and Tests Ordered: Current medicines are reviewed at length with the patient today.  Concerns regarding medicines are outlined above.  No orders of the defined types were placed in this encounter.  No orders of the defined types were placed in this encounter.   Signed, Thurmon FairMihai Laia Wiley, MD  05/16/2017 6:12 PM     [facility]

## 2017-05-16 NOTE — Patient Instructions (Signed)
Dr Croitoru recommends that you schedule a follow-up appointment in 6 months. You will receive a reminder letter in the mail two months in advance. If you don't receive a letter, please call our office to schedule the follow-up appointment.  If you need a refill on your cardiac medications before your next appointment, please call your pharmacy. 

## 2017-05-16 NOTE — Progress Notes (Signed)
Please place orders in Epic as patient is being scheduled for a pre-op appointment! Thank you! 

## 2017-05-16 NOTE — Telephone Encounter (Signed)
Clearance routed via Epic 

## 2017-05-16 NOTE — Telephone Encounter (Signed)
    [facility] Pre-operative Risk Assessment    Request for surgical clearance:  1. What type of surgery is being performed? Left Hip: THA w/wo allograft   2. When is this surgery scheduled? 06/04/2017   3. What type of clearance is required (medical clearance vs. Pharmacy clearance to hold med vs. Both)? Medical clearance  4. Are there any medications that need to be held prior to surgery and how long? Eliquis   5. Practice name and name of physician performing surgery? Dr. Paralee Cancel  @ [place]    6. What is your office phone and fax number? (f) [phone]  (p) [phone] (Attn: Fabio Asa )  7. Anesthesia type (None, local, MAC, general) ? Not specified    Garrett Hanson 05/16/2017, 4:23 PM  _________________________________________________________________   (provider comments below)

## 2017-05-16 NOTE — Telephone Encounter (Signed)
   Primary Cardiologist: Dr Royann Shiversroitoru  Pt seen and examined by Dr Royann Shiversroitoru today. Based on ACC/AHA guidelines, Garrett RainbowMark J Hanson would be at acceptable risk for the planned procedure without further cardiovascular testing. OK to hold Eliquis 48 hrs pre op.  I will route this recommendation to the requesting party via Epic fax function and remove from pre-op pool.  Please call with questions.  Corine ShelterLuke Freddye Cardamone, PA-C 05/16/2017, 4:46 PM

## 2017-05-17 DIAGNOSIS — Z6837 Body mass index (BMI) 37.0-37.9, adult: Secondary | ICD-10-CM

## 2017-05-17 DIAGNOSIS — E66812 Obesity, class 2: Secondary | ICD-10-CM | POA: Insufficient documentation

## 2017-05-18 DIAGNOSIS — G4733 Obstructive sleep apnea (adult) (pediatric): Secondary | ICD-10-CM | POA: Diagnosis not present

## 2017-05-20 NOTE — Patient Instructions (Signed)
Garrett Hanson  05/20/2017   Your procedure is scheduled on: 06-04-17  Report to [facility] Main  Entrance  Report to admitting at    1150AM   Call this number if you have problems the morning of surgery [phone]    Remember: Do not eat food  :After Midnight. YOU MAY HAVE CLEAR LIQUIDS UNTIL 0820 AM THEN NOTHING BY MOUTH     Take these medicines the morning of surgery with A SIP OF WATER: METOPROLOL, NASAL SPRAY, PEPCID, TIKOSYN, AMLODIPINE                                You may not have any metal on your body including hair pins and              piercings  Do not wear jewelry,lotions, powders or perfumes, deodorant                      Men may shave face and neck.   Do not bring valuables to the hospital. [place] IS NOT             RESPONSIBLE   FOR VALUABLES.  Contacts, dentures or bridgework may not be worn into surgery.  Leave suitcase in the car. After surgery it may be brought to your room.                 Please read over the following fact sheets you were given: _____________________________________________________________________           [facility] - Preparing for Surgery Before surgery, you can play an important role.  Because skin is not sterile, your skin needs to be as free of germs as possible.  You can reduce the number of germs on your skin by washing with CHG (chlorahexidine gluconate) soap before surgery.  CHG is an antiseptic cleaner which kills germs and bonds with the skin to continue killing germs even after washing. Please DO NOT use if you have an allergy to CHG or antibacterial soaps.  If your skin becomes reddened/irritated stop using the CHG and inform your nurse when you arrive at Short Stay. Do not shave (including legs and underarms) for at least 48 hours prior to the first CHG shower.  You may shave your face/neck. Please follow these instructions carefully:  1.  Shower with CHG Soap the night before surgery and  the  morning of Surgery.  2.  If you choose to wash your hair, wash your hair first as usual with your  normal  shampoo.  3.  After you shampoo, rinse your hair and body thoroughly to remove the  shampoo.                           4.  Use CHG as you would any other liquid soap.  You can apply chg directly  to the skin and wash                       Gently with a scrungie or clean washcloth.  5.  Apply the CHG Soap to your body ONLY FROM THE NECK DOWN.   Do not use on face/ open  Wound or open sores. Avoid contact with eyes, ears mouth and genitals (private parts).                       Wash face,  Genitals (private parts) with your normal soap.             6.  Wash thoroughly, paying special attention to the area where your surgery  will be performed.  7.  Thoroughly rinse your body with warm water from the neck down.  8.  DO NOT shower/wash with your normal soap after using and rinsing off  the CHG Soap.                9.  Pat yourself dry with a clean towel.            10.  Wear clean pajamas.            11.  Place clean sheets on your bed the night of your first shower and do not  sleep with pets. Day of Surgery : Do not apply any lotions/deodorants the morning of surgery.  Please wear clean clothes to the hospital/surgery center.  FAILURE TO FOLLOW THESE INSTRUCTIONS MAY RESULT IN THE CANCELLATION OF YOUR SURGERY PATIENT SIGNATURE_________________________________  NURSE SIGNATURE__________________________________  ________________________________________________________________________    CLEAR LIQUID DIET   Foods Allowed                                                                     Foods Excluded  Coffee and tea, regular and decaf                             liquids that you cannot  Plain Jell-O in any flavor                                             see through such as: Fruit ices (not with fruit pulp)                                     milk,  soups, orange juice  Iced Popsicles                                    All solid food Carbonated beverages, regular and diet                                    Cranberry, grape and apple juices Sports drinks like Gatorade Lightly seasoned clear broth or consume(fat free) Sugar, honey syrup  Sample Menu Breakfast                                Lunch  Supper Cranberry juice                    Beef broth                            Chicken broth Jell-O                                     Grape juice                           Apple juice Coffee or tea                        Jell-O                                      Popsicle                                                Coffee or tea                        Coffee or tea  _____________________________________________________________________   WHAT IS A BLOOD TRANSFUSION? Blood Transfusion Information  A transfusion is the replacement of blood or some of its parts. Blood is made up of multiple cells which provide different functions.  Red blood cells carry oxygen and are used for blood loss replacement.  White blood cells fight against infection.  Platelets control bleeding.  Plasma helps clot blood.  Other blood products are available for specialized needs, such as hemophilia or other clotting disorders. BEFORE THE TRANSFUSION  Who gives blood for transfusions?   Healthy volunteers who are fully evaluated to make sure their blood is safe. This is blood bank blood. Transfusion therapy is the safest it has ever been in the practice of medicine. Before blood is taken from a donor, a complete history is taken to make sure that person has no history of diseases nor engages in risky social behavior (examples are intravenous drug use or sexual activity with multiple partners). The donor's travel history is screened to minimize risk of transmitting infections, such as malaria. The donated blood is tested for  signs of infectious diseases, such as HIV and hepatitis. The blood is then tested to be sure it is compatible with you in order to minimize the chance of a transfusion reaction. If you or a relative donates blood, this is often done in anticipation of surgery and is not appropriate for emergency situations. It takes many days to process the donated blood. RISKS AND COMPLICATIONS Although transfusion therapy is very safe and saves many lives, the main dangers of transfusion include:   Getting an infectious disease.  Developing a transfusion reaction. This is an allergic reaction to something in the blood you were given. Every precaution is taken to prevent this. The decision to have a blood transfusion has been considered carefully by your caregiver before blood is given. Blood is not given unless the benefits outweigh the risks. AFTER THE TRANSFUSION  Right after receiving a blood transfusion, you will usually  feel much better and more energetic. This is especially true if your red blood cells have gotten low (anemic). The transfusion raises the level of the red blood cells which carry oxygen, and this usually causes an energy increase.  The nurse administering the transfusion will monitor you carefully for complications. HOME CARE INSTRUCTIONS  No special instructions are needed after a transfusion. You may find your energy is better. Speak with your caregiver about any limitations on activity for underlying diseases you may have. SEEK MEDICAL CARE IF:   Your condition is not improving after your transfusion.  You develop redness or irritation at the intravenous (IV) site. SEEK IMMEDIATE MEDICAL CARE IF:  Any of the following symptoms occur over the next 12 hours:  Shaking chills.  You have a temperature by mouth above 102 F (38.9 C), not controlled by medicine.  Chest, back, or muscle pain.  People around you feel you are not acting correctly or are confused.  Shortness of breath  or difficulty breathing.  Dizziness and fainting.  You get a rash or develop hives.  You have a decrease in urine output.  Your urine turns a dark color or changes to pink, red, or brown. Any of the following symptoms occur over the next 10 days:  You have a temperature by mouth above 102 F (38.9 C), not controlled by medicine.  Shortness of breath.  Weakness after normal activity.  The white part of the eye turns yellow (jaundice).  You have a decrease in the amount of urine or are urinating less often.  Your urine turns a dark color or changes to pink, red, or brown. Document Released: 04/06/2000 Document Revised: 07/02/2011 Document Reviewed: 11/24/2007 [employer] Patient Information 2014 [place], [place].  _______________________________________________________________________  Incentive Spirometer  An incentive spirometer is a tool that can help keep your lungs clear and active. This tool measures how well you are filling your lungs with each breath. Taking long deep breaths may help reverse or decrease the chance of developing breathing (pulmonary) problems (especially infection) following:  A long period of time when you are unable to move or be active. BEFORE THE PROCEDURE   If the spirometer includes an indicator to show your best effort, your nurse or respiratory therapist will set it to a desired goal.  If possible, sit up straight or lean slightly forward. Try not to slouch.  Hold the incentive spirometer in an upright position. INSTRUCTIONS FOR USE  1. Sit on the edge of your bed if possible, or sit up as far as you can in bed or on a chair. 2. Hold the incentive spirometer in an upright position. 3. Breathe out normally. 4. Place the mouthpiece in your mouth and seal your lips tightly around it. 5. Breathe in slowly and as deeply as possible, raising the piston or the ball toward the top of the column. 6. Hold your breath for 3-5 seconds or for as long as  possible. Allow the piston or ball to fall to the bottom of the column. 7. Remove the mouthpiece from your mouth and breathe out normally. 8. Rest for a few seconds and repeat Steps 1 through 7 at least 10 times every 1-2 hours when you are awake. Take your time and take a few normal breaths between deep breaths. 9. The spirometer may include an indicator to show your best effort. Use the indicator as a goal to work toward during each repetition. 10. After each set of 10 deep breaths, practice coughing to  be sure your lungs are clear. If you have an incision (the cut made at the time of surgery), support your incision when coughing by placing a pillow or rolled up towels firmly against it. Once you are able to get out of bed, walk around indoors and cough well. You may stop using the incentive spirometer when instructed by your caregiver.  RISKS AND COMPLICATIONS  Take your time so you do not get dizzy or light-headed.  If you are in pain, you may need to take or ask for pain medication before doing incentive spirometry. It is harder to take a deep breath if you are having pain. AFTER USE  Rest and breathe slowly and easily.  It can be helpful to keep track of a log of your progress. Your caregiver can provide you with a simple table to help with this. If you are using the spirometer at home, follow these instructions: SEEK MEDICAL CARE IF:   You are having difficultly using the spirometer.  You have trouble using the spirometer as often as instructed.  Your pain medication is not giving enough relief while using the spirometer.  You develop fever of 100.5 F (38.1 C) or higher. SEEK IMMEDIATE MEDICAL CARE IF:   You cough up bloody sputum that had not been present before.  You develop fever of 102 F (38.9 C) or greater.  You develop worsening pain at or near the incision site. MAKE SURE YOU:   Understand these instructions.  Will watch your condition.  Will get help right  away if you are not doing well or get worse. Document Released: 08/20/2006 Document Revised: 07/02/2011 Document Reviewed: 10/21/2006 [facility] Patient Information 2014 [place], [place].   ________________________________________________________________________

## 2017-05-20 NOTE — Progress Notes (Signed)
CLEARANCE DR. Royann ShiversROITORU  05-16-17 Epic   EKG 12-71-18 Epic  ECHO 12-21-16 EPIC

## 2017-05-27 ENCOUNTER — Encounter (HOSPITAL_COMMUNITY): Payer: Self-pay

## 2017-05-27 ENCOUNTER — Encounter (HOSPITAL_COMMUNITY)
Admission: RE | Admit: 2017-05-27 | Discharge: 2017-05-27 | Disposition: A | Discharge status: Home / Self Care | Payer: BLUE CROSS/BLUE SHIELD | Source: Ambulatory Visit | Attending: Orthopedic Surgery | Admitting: Orthopedic Surgery

## 2017-05-27 ENCOUNTER — Other Ambulatory Visit: Payer: Self-pay

## 2017-05-27 DIAGNOSIS — Z01812 Encounter for preprocedural laboratory examination: Secondary | ICD-10-CM | POA: Insufficient documentation

## 2017-05-27 LAB — CBC
HCT: 40.6 % (ref 39.0–52.0)
HEMOGLOBIN: 13.9 g/dL (ref 13.0–17.0)
MCH: 29.4 pg (ref 26.0–34.0)
MCHC: 34.2 g/dL (ref 30.0–36.0)
MCV: 85.8 fL (ref 78.0–100.0)
Platelets: 199 10*3/uL (ref 150–400)
RBC: 4.73 MIL/uL (ref 4.22–5.81)
RDW: 13.9 % (ref 11.5–15.5)
WBC: 5.7 10*3/uL (ref 4.0–10.5)

## 2017-05-27 LAB — BASIC METABOLIC PANEL
ANION GAP: 4 — AB (ref 5–15)
BUN: 19 mg/dL (ref 6–20)
CALCIUM: 8.5 mg/dL — AB (ref 8.9–10.3)
CHLORIDE: 106 mmol/L (ref 101–111)
CO2: 28 mmol/L (ref 22–32)
Creatinine, Ser: 1.07 mg/dL (ref 0.61–1.24)
GFR calc non Af Amer: >60 mL/min (ref >60)
Glucose, Bld: 96 mg/dL (ref 65–99)
Potassium: 4.2 mmol/L (ref 3.5–5.1)
Sodium: 138 mmol/L (ref 135–145)

## 2017-05-27 LAB — SURGICAL PCR SCREEN
MRSA, PCR: NEGATIVE
Staphylococcus aureus: NEGATIVE

## 2017-06-02 NOTE — H&P (Signed)
TOTAL HIP ADMISSION H&P  Patient is admitted for left total hip arthroplasty, anterior approach.  Subjective:  Chief Complaint:     Left hip primary OA / pain  HPI: Garrett Hanson, 57 y.o. male, has a history of pain and functional disability in the left hip(s) due to arthritis and patient has failed non-surgical conservative treatments for greater than 12 weeks to include NSAID's and/or analgesics and activity modification.  Onset of symptoms was gradual starting  years ago with rapidlly worsening course since that time.The patient noted no past surgery on the left hip(s).  Patient currently rates pain in the left hip at 9 out of 10 with activity. Patient has night pain, worsening of pain with activity and weight bearing, trendelenberg gait, pain that interfers with activities of daily living and pain with passive range of motion. Patient has evidence of periarticular osteophytes and joint space narrowing by imaging studies. This condition presents safety issues increasing the risk of falls.  There is no current active infection.  Risks, benefits and expectations were discussed with the patient.  Risks including but not limited to the risk of anesthesia, blood clots, nerve damage, blood vessel damage, failure of the prosthesis, infection and up to and including death.  Patient understand the risks, benefits and expectations and wishes to proceed with surgery.   PCP: Daisy Floro, MD  D/C Plans:       Home   Post-op Meds:       No Rx given  Tranexamic Acid:      To be given - IV   Decadron:      Is to be given  FYI:     Eliquis  Norco  CPAP  DME:   Pt already has equipment  PT:   No PT     Patient Active Problem List   Diagnosis Date Noted  . Class 2 severe obesity due to excess calories with serious comorbidity and body mass index (BMI) of 37.0 to 37.9 in adult (HCC) 05/17/2017  . Pure hypercholesterolemia   . Pneumonia   . OSA on CPAP   . Lymphadenopathy   . History of  kidney stones   . Essential hypertension   . Complication of anesthesia   . CKD (chronic kidney disease), stage II   . Chronic urticaria   . Chronic bronchitis (HCC)   . Arthritis   . Persistent atrial fibrillation (HCC) 12/31/2016  . CKD (chronic kidney disease), stage III (HCC) 12/31/2016  . Obstructive sleep apnea 12/31/2016  . Lymphadenopathy, abdominal 04/25/2016  . S/P left TKA 02/07/2015  . S/P knee replacement 02/07/2015  . History of common duodenal ulcer 04/23/1978  . History of blood transfusion 04/24/1959   Past Medical History:  Diagnosis Date  . Arthritis    "left knee; probably in the shoulders; left hip" (02/05/2017)  . Chronic bronchitis (HCC)    "get it ~ q other year" (02/05/2017)  . Chronic urticaria   . CKD (chronic kidney disease), stage II    pt denies this hx on 02/05/2017  . Complication of anesthesia    pt states awaken during colonscopy   . Dysrhythmia    a fib  . Essential hypertension   . History of blood transfusion 1961  . History of common duodenal ulcer 1980  . History of kidney stones   . Lymphadenopathy    a. evaluated by heme-onc 04/2016, under surveillance.  . OSA on CPAP dx'd ~ 09/2016  . Persistent atrial fibrillation (HCC)   .  Pneumonia    "X2" (02/05/2017)  . Pure hypercholesterolemia     Past Surgical History:  Procedure Laterality Date  . ANTERIOR CERVICAL DECOMP/DISCECTOMY FUSION  04/2005   "used piece from my hip"  . CARDIOVERSION N/A 01/14/2017   Procedure: CARDIOVERSION;  Surgeon: Jake BatheSkains, Jakorey C, MD;  [facility];  Service: Cardiovascular;  Laterality: N/A;  . CARDIOVERSION N/A 02/07/2017   Procedure: CARDIOVERSION;  Surgeon: Chilton Siandolph, Tiffany, MD;  [facility];  Service: Cardiovascular;  Laterality: N/A;  . COLONOSCOPY W/ BIOPSIES AND POLYPECTOMY  ~ 2012   "benign"  . ELBOW SURGERY Right    "Tommy John surgery"  . EXTRACORPOREAL SHOCK WAVE LITHOTRIPSY  1990s  . INGUINAL HERNIA REPAIR Right 1980   . JOINT REPLACEMENT     left knee  . KNEE ARTHROSCOPY Right ~ 1990  . SHOULDER ARTHROSCOPY W/ ROTATOR CUFF REPAIR Right 2000s X 2  . TONSILLECTOMY     "I think I did"  . TOTAL KNEE ARTHROPLASTY Left 02/07/2015   Procedure: LEFT TOTAL KNEE ARTHROPLASTY;  Surgeon: Durene RomansMatthew Olin, MD;  Location: [facility] ORS;  Service: Orthopedics;  Laterality: Left;    No current facility-administered medications for this encounter.    Current Outpatient Medications  Medication Sig Dispense Refill Last Dose  . amLODipine (NORVASC) 2.5 MG tablet Take 1 tablet (2.5 mg total) by mouth daily. 180 tablet 3 Taking  . apixaban (ELIQUIS) 5 MG TABS tablet Take 1 tablet (5 mg total) by mouth 2 (two) times daily. 60 tablet 1 Taking  . atorvastatin (LIPITOR) 40 MG tablet Take 40 mg by mouth every evening.   Taking  . dofetilide (TIKOSYN) 500 MCG capsule Take 1 capsule (500 mcg total) by mouth 2 (two) times daily. 180 capsule 0 Taking  . famotidine (PEPCID) 40 MG tablet Take 40 mg by mouth 2 (two) times daily.   Taking  . fluticasone (VERAMYST) 27.5 MCG/SPRAY nasal spray Place 1 spray into the nose daily as needed for allergies.    Taking  . HYDROcodone-acetaminophen (NORCO/VICODIN) 5-325 MG tablet Take 0.5 tablets by mouth every 4 (four) hours as needed for moderate pain (Pt typically takes twice daily as needed).      . hydrocortisone cream 1 % Apply topically every 6 (six) hours as needed for itching. 30 g 0 Taking  . lactobacillus acidophilus (BACID) TABS tablet Take 1 tablet by mouth daily.   Taking  . losartan (COZAAR) 100 MG tablet Take 100 mg by mouth every morning.   Taking  . methocarbamol (ROBAXIN) 500 MG tablet Take 250 mg by mouth 2 (two) times daily as needed for muscle spasms.     . metoprolol tartrate (LOPRESSOR) 25 MG tablet Take 25 mg by mouth 2 (two) times daily.   Taking  . nystatin (MYCOSTATIN/NYSTOP) powder Apply 1 application topically 2 (two) times daily as needed.  5 Taking  . potassium chloride SA  (K-DUR,KLOR-CON) 20 MEQ tablet Take 1 tablet (20 mEq total) by mouth 2 (two) times daily. 180 tablet 3 Taking  . spironolactone (ALDACTONE) 25 MG tablet Take 1 tablet (25 mg total) by mouth daily. 90 tablet 3 Taking   Allergies  Allergen Reactions  . Contrast Media [Iodinated Diagnostic Agents] Shortness Of Breath and Palpitations    Broke out in sweats.  . Penicillins Anaphylaxis    Has patient had a PCN reaction causing immediate rash, facial/tongue/throat swelling, SOB or lightheadedness with hypotension: Yes Has patient had a PCN reaction causing severe rash involving mucus membranes or skin necrosis:  No Has patient had a PCN reaction that required hospitalization:Yes Has patient had a PCN reaction occurring within the last 10 years: No If all of the above answers are "NO", then may proceed with Cephalosporin use.   Marland Kitchen Singulair [Montelukast Sodium] Swelling    Lips, mouth, and hand swelling     Social History   Tobacco Use  . Smoking status: Never Smoker  . Smokeless tobacco: Never Used  Substance Use Topics  . Alcohol use: Yes    Comment: 02/05/2017 "2-4 glasses of wine/month"    Family History  Problem Relation Age of Onset  . Hypertension Mother   . Hyperlipidemia Mother   . Breast cancer Mother   . Uterine cancer Mother   . Sleep apnea Mother   . Hypertension Brother   . Hyperlipidemia Brother   . Cancer Brother      Review of Systems  Constitutional: Negative.   HENT: Negative.   Eyes: Negative.   Respiratory: Negative.   Cardiovascular: Negative.   Gastrointestinal: Negative.   Genitourinary: Negative.   Musculoskeletal: Positive for joint pain.  Skin: Negative.   Neurological: Negative.   Endo/Heme/Allergies: Negative.   Psychiatric/Behavioral: Negative.     Objective:  Physical Exam  Constitutional: He is oriented to person, place, and time. He appears well-developed.  HENT:  Head: Normocephalic.  Eyes: Pupils are equal, round, and reactive to  light.  Neck: Neck supple. No JVD present. No tracheal deviation present. No thyromegaly present.  Cardiovascular: Normal rate, regular rhythm and intact distal pulses.  Respiratory: Effort normal and breath sounds normal. No respiratory distress. He has no wheezes.  GI: Soft. There is no tenderness. There is no guarding.  Musculoskeletal:       Left hip: He exhibits decreased range of motion, decreased strength, tenderness and bony tenderness. He exhibits no swelling and no laceration.  Lymphadenopathy:    He has no cervical adenopathy.  Neurological: He is alert and oriented to person, place, and time.  Skin: Skin is warm and dry.  Psychiatric: He has a normal mood and affect.      Labs:  Estimated body mass index is 37.75 kg/m as calculated from the following:   Height as of 05/27/17: 6\' 2"  (1.88 m).   Weight as of 05/27/17: 133.4 kg (294 lb).   Imaging Review Plain radiographs demonstrate moderate degenerative joint disease of the left hip(s). The bone quality appears to be good for age and reported activity level.  Assessment/Plan:  End stage arthritis, left hip(s)  The patient history, physical examination, clinical judgement of the provider and imaging studies are consistent with end stage degenerative joint disease of the left hip(s) and total hip arthroplasty is deemed medically necessary. The treatment options including medical management, injection therapy, arthroscopy and arthroplasty were discussed at length. The risks and benefits of total hip arthroplasty were presented and reviewed. The risks due to aseptic loosening, infection, stiffness, dislocation/subluxation,  thromboembolic complications and other imponderables were discussed.  The patient acknowledged the explanation, agreed to proceed with the plan and consent was signed. Patient is being admitted for inpatient treatment for surgery, pain control, PT, OT, prophylactic antibiotics, VTE prophylaxis, progressive  ambulation and ADL's and discharge planning.The patient is planning to be discharged home with home.     Garrett Auerbach Kensley Valladares   PA-C  06/02/2017, 2:29 PM

## 2017-06-03 MED ORDER — TRANEXAMIC ACID 1000 MG/10ML IV SOLN
1000.0000 mg | INTRAVENOUS | Status: AC
  Administered 2017-06-04: 1000 mg via INTRAVENOUS
  Filled 2017-06-03: qty 1100

## 2017-06-03 MED ORDER — VANCOMYCIN HCL 10 G IV SOLR
1500.0000 mg | INTRAVENOUS | Status: AC
  Administered 2017-06-04: 1500 mg via INTRAVENOUS
  Filled 2017-06-03: qty 1500

## 2017-06-03 MED ORDER — GENTAMICIN SULFATE 40 MG/ML IJ SOLN
5.0000 mg/kg | INTRAVENOUS | Status: AC
  Administered 2017-06-04: 510 mg via INTRAVENOUS
  Filled 2017-06-03: qty 12.75

## 2017-06-04 ENCOUNTER — Encounter (HOSPITAL_COMMUNITY)
Admission: RE | Disposition: A | Discharge status: Home / Self Care | Payer: Self-pay | Source: Ambulatory Visit | Attending: Orthopedic Surgery

## 2017-06-04 ENCOUNTER — Other Ambulatory Visit: Payer: Self-pay

## 2017-06-04 ENCOUNTER — Inpatient Hospital Stay (HOSPITAL_COMMUNITY): Payer: BLUE CROSS/BLUE SHIELD

## 2017-06-04 ENCOUNTER — Inpatient Hospital Stay (HOSPITAL_COMMUNITY): Payer: BLUE CROSS/BLUE SHIELD | Admitting: Anesthesiology

## 2017-06-04 ENCOUNTER — Encounter (HOSPITAL_COMMUNITY): Payer: Self-pay

## 2017-06-04 ENCOUNTER — Inpatient Hospital Stay (HOSPITAL_COMMUNITY)
Admission: RE | Admit: 2017-06-04 | Discharge: 2017-06-05 | DRG: 470 | Disposition: A | Discharge status: Home / Self Care | Payer: BLUE CROSS/BLUE SHIELD | Source: Ambulatory Visit | Attending: Orthopedic Surgery | Admitting: Orthopedic Surgery

## 2017-06-04 DIAGNOSIS — Z87442 Personal history of urinary calculi: Secondary | ICD-10-CM | POA: Diagnosis not present

## 2017-06-04 DIAGNOSIS — Z79899 Other long term (current) drug therapy: Secondary | ICD-10-CM | POA: Diagnosis not present

## 2017-06-04 DIAGNOSIS — Z471 Aftercare following joint replacement surgery: Secondary | ICD-10-CM | POA: Diagnosis not present

## 2017-06-04 DIAGNOSIS — Z981 Arthrodesis status: Secondary | ICD-10-CM

## 2017-06-04 DIAGNOSIS — M1612 Unilateral primary osteoarthritis, left hip: Secondary | ICD-10-CM | POA: Diagnosis not present

## 2017-06-04 DIAGNOSIS — Z91041 Radiographic dye allergy status: Secondary | ICD-10-CM

## 2017-06-04 DIAGNOSIS — Z7901 Long term (current) use of anticoagulants: Secondary | ICD-10-CM

## 2017-06-04 DIAGNOSIS — Z8711 Personal history of peptic ulcer disease: Secondary | ICD-10-CM | POA: Diagnosis not present

## 2017-06-04 DIAGNOSIS — Z8349 Family history of other endocrine, nutritional and metabolic diseases: Secondary | ICD-10-CM | POA: Diagnosis not present

## 2017-06-04 DIAGNOSIS — Z880 Allergy status to penicillin: Secondary | ICD-10-CM | POA: Diagnosis not present

## 2017-06-04 DIAGNOSIS — Z96652 Presence of left artificial knee joint: Secondary | ICD-10-CM | POA: Diagnosis present

## 2017-06-04 DIAGNOSIS — R590 Localized enlarged lymph nodes: Secondary | ICD-10-CM | POA: Diagnosis present

## 2017-06-04 DIAGNOSIS — Z96642 Presence of left artificial hip joint: Secondary | ICD-10-CM

## 2017-06-04 DIAGNOSIS — Z888 Allergy status to other drugs, medicaments and biological substances status: Secondary | ICD-10-CM | POA: Diagnosis not present

## 2017-06-04 DIAGNOSIS — K219 Gastro-esophageal reflux disease without esophagitis: Secondary | ICD-10-CM | POA: Diagnosis present

## 2017-06-04 DIAGNOSIS — I129 Hypertensive chronic kidney disease with stage 1 through stage 4 chronic kidney disease, or unspecified chronic kidney disease: Secondary | ICD-10-CM | POA: Diagnosis present

## 2017-06-04 DIAGNOSIS — Z6837 Body mass index (BMI) 37.0-37.9, adult: Secondary | ICD-10-CM | POA: Diagnosis not present

## 2017-06-04 DIAGNOSIS — G4733 Obstructive sleep apnea (adult) (pediatric): Secondary | ICD-10-CM | POA: Diagnosis not present

## 2017-06-04 DIAGNOSIS — E7800 Pure hypercholesterolemia, unspecified: Secondary | ICD-10-CM | POA: Diagnosis not present

## 2017-06-04 DIAGNOSIS — I481 Persistent atrial fibrillation: Secondary | ICD-10-CM | POA: Diagnosis not present

## 2017-06-04 DIAGNOSIS — Z96649 Presence of unspecified artificial hip joint: Secondary | ICD-10-CM

## 2017-06-04 DIAGNOSIS — N183 Chronic kidney disease, stage 3 (moderate): Secondary | ICD-10-CM | POA: Diagnosis not present

## 2017-06-04 DIAGNOSIS — R269 Unspecified abnormalities of gait and mobility: Secondary | ICD-10-CM | POA: Diagnosis not present

## 2017-06-04 DIAGNOSIS — Z8249 Family history of ischemic heart disease and other diseases of the circulatory system: Secondary | ICD-10-CM

## 2017-06-04 HISTORY — PX: TOTAL HIP ARTHROPLASTY: SHX124

## 2017-06-04 LAB — POCT I-STAT 4, (NA,K, GLUC, HGB,HCT)
GLUCOSE: 103 mg/dL — AB (ref 65–99)
HEMATOCRIT: 37 % — AB (ref 39.0–52.0)
HEMOGLOBIN: 12.6 g/dL — AB (ref 13.0–17.0)
Potassium: 4.8 mmol/L (ref 3.5–5.1)
Sodium: 141 mmol/L (ref 135–145)

## 2017-06-04 LAB — TYPE AND SCREEN
ABO/RH(D): B POS
Antibody Screen: NEGATIVE

## 2017-06-04 SURGERY — ARTHROPLASTY, HIP, TOTAL, ANTERIOR APPROACH
Anesthesia: Spinal | Site: Hip | Laterality: Left

## 2017-06-04 MED ORDER — SODIUM CHLORIDE 0.9 % IR SOLN
Status: DC | PRN
  Administered 2017-06-04: 1000 mL

## 2017-06-04 MED ORDER — FENTANYL CITRATE (PF) 100 MCG/2ML IJ SOLN
INTRAMUSCULAR | Status: AC
  Filled 2017-06-04: qty 2

## 2017-06-04 MED ORDER — HYDROCODONE-ACETAMINOPHEN 7.5-325 MG PO TABS: 1.0000 | ORAL_TABLET | ORAL | 0 refills | Status: DC | PRN

## 2017-06-04 MED ORDER — HYDROMORPHONE HCL 1 MG/ML IJ SOLN
0.5000 mg | INTRAMUSCULAR | Status: DC | PRN
  Administered 2017-06-04 – 2017-06-05 (×2): 1 mg via INTRAVENOUS
  Filled 2017-06-04 (×2): qty 1

## 2017-06-04 MED ORDER — FLUTICASONE PROPIONATE 50 MCG/ACT NA SUSP
1.0000 | Freq: Every day | NASAL | Status: DC | PRN
  Filled 2017-06-04: qty 16

## 2017-06-04 MED ORDER — PHENYLEPHRINE 40 MCG/ML (10ML) SYRINGE FOR IV PUSH (FOR BLOOD PRESSURE SUPPORT)
PREFILLED_SYRINGE | INTRAVENOUS | Status: DC | PRN
  Administered 2017-06-04 (×3): 80 ug via INTRAVENOUS

## 2017-06-04 MED ORDER — PROPOFOL 10 MG/ML IV BOLUS
INTRAVENOUS | Status: DC | PRN
  Administered 2017-06-04 (×6): 20 mg via INTRAVENOUS

## 2017-06-04 MED ORDER — DIPHENHYDRAMINE HCL 12.5 MG/5ML PO ELIX: 12.5000 mg | ORAL_SOLUTION | ORAL | Status: DC | PRN

## 2017-06-04 MED ORDER — PROPOFOL 10 MG/ML IV BOLUS
INTRAVENOUS | Status: AC
  Filled 2017-06-04: qty 20

## 2017-06-04 MED ORDER — METHOCARBAMOL 500 MG PO TABS
500.0000 mg | ORAL_TABLET | Freq: Four times a day (QID) | ORAL | Status: DC | PRN
  Administered 2017-06-04 – 2017-06-05 (×2): 500 mg via ORAL
  Filled 2017-06-04 (×3): qty 1

## 2017-06-04 MED ORDER — FLUTICASONE FUROATE 27.5 MCG/SPRAY NA SUSP: 1.0000 | Freq: Every day | NASAL | Status: DC | PRN

## 2017-06-04 MED ORDER — METOPROLOL TARTRATE 25 MG PO TABS
25.0000 mg | ORAL_TABLET | Freq: Two times a day (BID) | ORAL | Status: DC
  Administered 2017-06-04: 25 mg via ORAL
  Filled 2017-06-04: qty 1

## 2017-06-04 MED ORDER — FENTANYL CITRATE (PF) 250 MCG/5ML IJ SOLN
INTRAMUSCULAR | Status: DC | PRN
  Administered 2017-06-04: 50 ug via INTRAVENOUS

## 2017-06-04 MED ORDER — METHOCARBAMOL 500 MG PO TABS: 500.0000 mg | ORAL_TABLET | Freq: Four times a day (QID) | ORAL | 0 refills | Status: DC | PRN

## 2017-06-04 MED ORDER — ATORVASTATIN CALCIUM 40 MG PO TABS
40.0000 mg | ORAL_TABLET | Freq: Every evening | ORAL | Status: DC
  Administered 2017-06-04: 40 mg via ORAL
  Filled 2017-06-04: qty 1

## 2017-06-04 MED ORDER — FAMOTIDINE 20 MG PO TABS
40.0000 mg | ORAL_TABLET | Freq: Two times a day (BID) | ORAL | Status: DC
  Administered 2017-06-04 – 2017-06-05 (×2): 40 mg via ORAL
  Filled 2017-06-04 (×2): qty 2

## 2017-06-04 MED ORDER — MIDAZOLAM HCL 2 MG/2ML IJ SOLN
INTRAMUSCULAR | Status: AC
  Filled 2017-06-04: qty 2

## 2017-06-04 MED ORDER — POTASSIUM CHLORIDE CRYS ER 20 MEQ PO TBCR
20.0000 meq | EXTENDED_RELEASE_TABLET | Freq: Two times a day (BID) | ORAL | Status: DC
  Administered 2017-06-04 – 2017-06-05 (×2): 20 meq via ORAL
  Filled 2017-06-04 (×2): qty 1

## 2017-06-04 MED ORDER — DOCUSATE SODIUM 100 MG PO CAPS: 100.0000 mg | ORAL_CAPSULE | Freq: Two times a day (BID) | ORAL | 0 refills | Status: DC

## 2017-06-04 MED ORDER — PHENYLEPHRINE HCL 10 MG/ML IJ SOLN: 30.0000 ug/min | INTRAVENOUS | Status: DC

## 2017-06-04 MED ORDER — LACTATED RINGERS IV SOLN
INTRAVENOUS | Status: DC
  Administered 2017-06-04: 18:00:00 via INTRAVENOUS

## 2017-06-04 MED ORDER — LACTINEX PO CHEW
1.0000 | CHEWABLE_TABLET | Freq: Every day | ORAL | Status: DC
  Administered 2017-06-04: 1 via ORAL
  Filled 2017-06-04 (×2): qty 1

## 2017-06-04 MED ORDER — ONDANSETRON HCL 4 MG PO TABS: 4.0000 mg | ORAL_TABLET | Freq: Three times a day (TID) | ORAL | Status: DC | PRN

## 2017-06-04 MED ORDER — HYDROMORPHONE HCL 1 MG/ML IJ SOLN
0.2500 mg | INTRAMUSCULAR | Status: DC | PRN
  Administered 2017-06-04 (×4): 0.5 mg via INTRAVENOUS

## 2017-06-04 MED ORDER — AMLODIPINE BESYLATE 5 MG PO TABS: 2.5000 mg | ORAL_TABLET | Freq: Every day | ORAL | Status: DC

## 2017-06-04 MED ORDER — APIXABAN 2.5 MG PO TABS
2.5000 mg | ORAL_TABLET | Freq: Two times a day (BID) | ORAL | Status: DC
  Administered 2017-06-05: 2.5 mg via ORAL
  Filled 2017-06-04: qty 1

## 2017-06-04 MED ORDER — ALBUMIN HUMAN 5 % IV SOLN
INTRAVENOUS | Status: DC | PRN
  Administered 2017-06-04 (×2): via INTRAVENOUS

## 2017-06-04 MED ORDER — VANCOMYCIN HCL IN DEXTROSE 1-5 GM/200ML-% IV SOLN
1000.0000 mg | Freq: Two times a day (BID) | INTRAVENOUS | Status: AC
  Administered 2017-06-05: 1000 mg via INTRAVENOUS
  Filled 2017-06-04: qty 200

## 2017-06-04 MED ORDER — HYDROMORPHONE HCL 1 MG/ML IJ SOLN
INTRAMUSCULAR | Status: AC
  Filled 2017-06-04: qty 1

## 2017-06-04 MED ORDER — HYDROCODONE-ACETAMINOPHEN 7.5-325 MG PO TABS
2.0000 | ORAL_TABLET | ORAL | Status: DC | PRN
  Administered 2017-06-04 – 2017-06-05 (×5): 2 via ORAL
  Filled 2017-06-04 (×5): qty 2

## 2017-06-04 MED ORDER — DOFETILIDE 250 MCG PO CAPS
500.0000 ug | ORAL_CAPSULE | Freq: Two times a day (BID) | ORAL | Status: DC
  Administered 2017-06-04 – 2017-06-05 (×2): 500 ug via ORAL
  Filled 2017-06-04 (×3): qty 2

## 2017-06-04 MED ORDER — DEXAMETHASONE SODIUM PHOSPHATE 10 MG/ML IJ SOLN
10.0000 mg | Freq: Once | INTRAMUSCULAR | Status: AC
  Administered 2017-06-05: 10 mg via INTRAVENOUS
  Filled 2017-06-04: qty 1

## 2017-06-04 MED ORDER — ALUM & MAG HYDROXIDE-SIMETH 200-200-20 MG/5ML PO SUSP: 15.0000 mL | ORAL | Status: DC | PRN

## 2017-06-04 MED ORDER — MIDAZOLAM HCL 2 MG/2ML IJ SOLN
INTRAMUSCULAR | Status: DC | PRN
  Administered 2017-06-04: 2 mg via INTRAVENOUS

## 2017-06-04 MED ORDER — ACETAMINOPHEN 650 MG RE SUPP: 650.0000 mg | RECTAL | Status: DC | PRN

## 2017-06-04 MED ORDER — PROPOFOL 500 MG/50ML IV EMUL
INTRAVENOUS | Status: DC | PRN
  Administered 2017-06-04: 75 ug/kg/min via INTRAVENOUS

## 2017-06-04 MED ORDER — TRANEXAMIC ACID 1000 MG/10ML IV SOLN
1000.0000 mg | Freq: Once | INTRAVENOUS | Status: AC
  Administered 2017-06-04: 1000 mg via INTRAVENOUS
  Filled 2017-06-04: qty 10

## 2017-06-04 MED ORDER — HYDROCODONE-ACETAMINOPHEN 7.5-325 MG PO TABS: 1.0000 | ORAL_TABLET | ORAL | Status: DC | PRN

## 2017-06-04 MED ORDER — ONDANSETRON HCL 4 MG/2ML IJ SOLN: 4.0000 mg | Freq: Three times a day (TID) | INTRAMUSCULAR | Status: DC | PRN

## 2017-06-04 MED ORDER — METHOCARBAMOL 1000 MG/10ML IJ SOLN
500.0000 mg | Freq: Four times a day (QID) | INTRAVENOUS | Status: DC | PRN
  Filled 2017-06-04: qty 5

## 2017-06-04 MED ORDER — POLYETHYLENE GLYCOL 3350 17 G PO PACK: 17.0000 g | PACK | Freq: Two times a day (BID) | ORAL | 0 refills | Status: DC

## 2017-06-04 MED ORDER — FERROUS SULFATE 325 (65 FE) MG PO TABS: 325.0000 mg | ORAL_TABLET | Freq: Three times a day (TID) | ORAL | 3 refills | Status: DC

## 2017-06-04 MED ORDER — SODIUM CHLORIDE 0.9 % IV SOLN
INTRAVENOUS | Status: DC
  Administered 2017-06-04 – 2017-06-05 (×2): via INTRAVENOUS

## 2017-06-04 MED ORDER — PROPOFOL 10 MG/ML IV BOLUS
INTRAVENOUS | Status: AC
  Filled 2017-06-04: qty 40

## 2017-06-04 MED ORDER — METOCLOPRAMIDE HCL 5 MG/ML IJ SOLN: 5.0000 mg | Freq: Three times a day (TID) | INTRAMUSCULAR | Status: DC | PRN

## 2017-06-04 MED ORDER — LOSARTAN POTASSIUM 50 MG PO TABS: 100.0000 mg | ORAL_TABLET | Freq: Every morning | ORAL | Status: DC

## 2017-06-04 MED ORDER — LIDOCAINE 2% (20 MG/ML) 5 ML SYRINGE
INTRAMUSCULAR | Status: DC | PRN
  Administered 2017-06-04: 60 mg via INTRAVENOUS

## 2017-06-04 MED ORDER — ONDANSETRON HCL 4 MG/2ML IJ SOLN
INTRAMUSCULAR | Status: DC | PRN
  Administered 2017-06-04: 4 mg via INTRAVENOUS

## 2017-06-04 MED ORDER — MENTHOL 3 MG MT LOZG: 1.0000 | LOZENGE | OROMUCOSAL | Status: DC | PRN

## 2017-06-04 MED ORDER — FERROUS SULFATE 325 (65 FE) MG PO TABS
325.0000 mg | ORAL_TABLET | Freq: Three times a day (TID) | ORAL | Status: DC
  Administered 2017-06-05 (×2): 325 mg via ORAL
  Filled 2017-06-04 (×2): qty 1

## 2017-06-04 MED ORDER — BISACODYL 10 MG RE SUPP: 10.0000 mg | Freq: Every day | RECTAL | Status: DC | PRN

## 2017-06-04 MED ORDER — BACID PO TABS
1.0000 | ORAL_TABLET | Freq: Every day | ORAL | Status: DC
  Filled 2017-06-04: qty 1

## 2017-06-04 MED ORDER — CHLORHEXIDINE GLUCONATE 4 % EX LIQD: 60.0000 mL | Freq: Once | CUTANEOUS | Status: DC

## 2017-06-04 MED ORDER — MIDAZOLAM HCL 2 MG/2ML IJ SOLN: 0.5000 mg | Freq: Once | INTRAMUSCULAR | Status: DC | PRN

## 2017-06-04 MED ORDER — PHENOL 1.4 % MT LIQD
1.0000 | OROMUCOSAL | Status: DC | PRN
  Filled 2017-06-04: qty 177

## 2017-06-04 MED ORDER — STERILE WATER FOR IRRIGATION IR SOLN
Status: DC | PRN
  Administered 2017-06-04: 2000 mL

## 2017-06-04 MED ORDER — LACTATED RINGERS IV SOLN
INTRAVENOUS | Status: DC | PRN
  Administered 2017-06-04 (×2): via INTRAVENOUS

## 2017-06-04 MED ORDER — METOCLOPRAMIDE HCL 5 MG PO TABS: 5.0000 mg | ORAL_TABLET | Freq: Three times a day (TID) | ORAL | Status: DC | PRN

## 2017-06-04 MED ORDER — FENTANYL CITRATE (PF) 250 MCG/5ML IJ SOLN
INTRAMUSCULAR | Status: AC
  Filled 2017-06-04: qty 5

## 2017-06-04 MED ORDER — CELECOXIB 200 MG PO CAPS
200.0000 mg | ORAL_CAPSULE | Freq: Two times a day (BID) | ORAL | Status: DC
  Administered 2017-06-04: 200 mg via ORAL
  Filled 2017-06-04 (×2): qty 1

## 2017-06-04 MED ORDER — PROMETHAZINE HCL 25 MG/ML IJ SOLN
6.2500 mg | INTRAMUSCULAR | Status: DC | PRN
Start: 2017-06-04 — End: 2017-06-04

## 2017-06-04 MED ORDER — MEPERIDINE HCL 50 MG/ML IJ SOLN: 6.2500 mg | INTRAMUSCULAR | Status: DC | PRN

## 2017-06-04 MED ORDER — MAGNESIUM CITRATE PO SOLN
1.0000 | Freq: Once | ORAL | Status: DC | PRN
Start: 2017-06-04 — End: 2017-06-05

## 2017-06-04 MED ORDER — PHENYLEPHRINE HCL 10 MG/ML IJ SOLN
INTRAVENOUS | Status: DC | PRN
  Administered 2017-06-04: 25 ug/min via INTRAVENOUS

## 2017-06-04 MED ORDER — ACETAMINOPHEN 325 MG PO TABS
650.0000 mg | ORAL_TABLET | ORAL | Status: DC | PRN
  Filled 2017-06-04: qty 2

## 2017-06-04 MED ORDER — DOCUSATE SODIUM 100 MG PO CAPS
100.0000 mg | ORAL_CAPSULE | Freq: Two times a day (BID) | ORAL | Status: DC
  Administered 2017-06-04 – 2017-06-05 (×2): 100 mg via ORAL
  Filled 2017-06-04 (×2): qty 1

## 2017-06-04 MED ORDER — DEXAMETHASONE SODIUM PHOSPHATE 10 MG/ML IJ SOLN
10.0000 mg | Freq: Once | INTRAMUSCULAR | Status: AC
  Administered 2017-06-04: 10 mg via INTRAVENOUS

## 2017-06-04 MED ORDER — POLYETHYLENE GLYCOL 3350 17 G PO PACK
17.0000 g | PACK | Freq: Two times a day (BID) | ORAL | Status: DC
  Administered 2017-06-04 – 2017-06-05 (×2): 17 g via ORAL
  Filled 2017-06-04 (×2): qty 1

## 2017-06-04 SURGICAL SUPPLY — 41 items
BAG ZIPLOCK 12X15 (MISCELLANEOUS) IMPLANT
BLADE SAG 18X100X1.27 (BLADE) ×2 IMPLANT
CAPT HIP TOTAL 2 ×2 IMPLANT
CHLORAPREP W/TINT 26ML (MISCELLANEOUS) ×2 IMPLANT
CLOTH BEACON ORANGE TIMEOUT ST (SAFETY) ×2 IMPLANT
COVER PERINEAL POST (MISCELLANEOUS) ×2 IMPLANT
COVER SURGICAL LIGHT HANDLE (MISCELLANEOUS) ×2 IMPLANT
DERMABOND ADVANCED (GAUZE/BANDAGES/DRESSINGS) ×1
DERMABOND ADVANCED .7 DNX12 (GAUZE/BANDAGES/DRESSINGS) ×1 IMPLANT
DRAPE EENT ADH APERT 31X51 STR (DRAPES) ×2 IMPLANT
DRAPE POUCH INSTRU U-SHP 10X18 (DRAPES) ×2 IMPLANT
DRAPE U-SHAPE 47X51 STRL (DRAPES) ×4 IMPLANT
DRESSING AQUACEL AG SP 3.5X10 (GAUZE/BANDAGES/DRESSINGS) ×1 IMPLANT
DRSG AQUACEL AG SP 3.5X10 (GAUZE/BANDAGES/DRESSINGS) ×2
DURAPREP 26ML APPLICATOR (WOUND CARE) IMPLANT
ELECT REM PT RETURN 15FT ADLT (MISCELLANEOUS) ×2 IMPLANT
ELIMINATOR HOLE APEX DEPUY (Hips) IMPLANT
GLOVE BIOGEL M STRL SZ7.5 (GLOVE) IMPLANT
GLOVE BIOGEL PI IND STRL 7.5 (GLOVE) ×1 IMPLANT
GLOVE BIOGEL PI IND STRL 8 (GLOVE) ×1 IMPLANT
GLOVE BIOGEL PI IND STRL 8.5 (GLOVE) ×1 IMPLANT
GLOVE BIOGEL PI INDICATOR 7.5 (GLOVE) ×1
GLOVE BIOGEL PI INDICATOR 8 (GLOVE) ×1
GLOVE BIOGEL PI INDICATOR 8.5 (GLOVE) ×1
GLOVE ECLIPSE 7.5 STRL STRAW (GLOVE) ×2 IMPLANT
GLOVE ECLIPSE 8.0 STRL XLNG CF (GLOVE) ×4 IMPLANT
GLOVE ORTHO TXT STRL SZ7.5 (GLOVE) ×2 IMPLANT
GOWN STRL REUS W/ TWL XL LVL3 (GOWN DISPOSABLE) ×1 IMPLANT
GOWN STRL REUS W/TWL LRG LVL3 (GOWN DISPOSABLE) ×4 IMPLANT
GOWN STRL REUS W/TWL XL LVL3 (GOWN DISPOSABLE) ×3 IMPLANT
HOLDER FOLEY CATH W/STRAP (MISCELLANEOUS) ×2 IMPLANT
PACK ANTERIOR HIP CUSTOM (KITS) ×2 IMPLANT
SUT MNCRL AB 4-0 PS2 18 (SUTURE) ×2 IMPLANT
SUT STRATAFIX 0 PDS 27 VIOLET (SUTURE) ×2
SUT VIC AB 0 CT1 36 (SUTURE) ×2 IMPLANT
SUT VIC AB 1 CT1 36 (SUTURE) ×6 IMPLANT
SUT VIC AB 2-0 CT1 27 (SUTURE) ×2
SUT VIC AB 2-0 CT1 TAPERPNT 27 (SUTURE) ×2 IMPLANT
SUTURE STRATFX 0 PDS 27 VIOLET (SUTURE) ×1 IMPLANT
TRAY FOLEY W/METER SILVER 16FR (SET/KITS/TRAYS/PACK) ×2 IMPLANT
YANKAUER SUCT BULB TIP 10FT TU (MISCELLANEOUS) IMPLANT

## 2017-06-04 NOTE — Progress Notes (Signed)
X-ray results noted 

## 2017-06-04 NOTE — Anesthesia Preprocedure Evaluation (Addendum)
Anesthesia Evaluation  Patient identified by MRN, date of birth, ID band Patient awake    Reviewed: Allergy & Precautions, NPO status , Patient's Chart, lab work & pertinent test results  History of Anesthesia Complications Negative for: history of anesthetic complications  Airway Mallampati: II  TM Distance: >3 FB Neck ROM: Full    Dental  (+) Dental Advisory Given, Caps   Pulmonary sleep apnea and Continuous Positive Airway Pressure Ventilation ,    breath sounds clear to auscultation       Cardiovascular hypertension, Pt. on medications and Pt. on home beta blockers (-) angina+ dysrhythmias Atrial Fibrillation  Rhythm:Irregular Rate:Normal  8/18 ECHO:  overall looked normal, mild LVH, dilated atria   Neuro/Psych negative neurological ROS     GI/Hepatic Neg liver ROS, GERD  Controlled,  Endo/Other  Morbid obesity  Renal/GU negative Renal ROS     Musculoskeletal  (+) Arthritis , Osteoarthritis,    Abdominal (+) + obese,   Peds  Hematology  (+) Blood dyscrasia (eliquis), ,   Anesthesia Other Findings   Reproductive/Obstetrics                            Anesthesia Physical Anesthesia Plan  ASA: III  Anesthesia Plan: Spinal   Post-op Pain Management:    Induction:   PONV Risk Score and Plan: 1 and Ondansetron and Dexamethasone  Airway Management Planned: Natural Airway and Simple Face Mask  Additional Equipment:   Intra-op Plan:   Post-operative Plan:   Informed Consent: I have reviewed the patients History and Physical, chart, labs and discussed the procedure including the risks, benefits and alternatives for the proposed anesthesia with the patient or authorized representative who has indicated his/her understanding and acceptance.   Dental advisory given  Plan Discussed with: CRNA and Surgeon  Anesthesia Plan Comments: (Plan routine monitors, SAB)         Anesthesia Quick Evaluation

## 2017-06-04 NOTE — Op Note (Addendum)
NAME:  Garrett Hanson                ACCOUNT NO.: [id number]      MEDICAL RECORD NO.: [id number]      FACILITY:  [facility]      PHYSICIAN:  Shelda Pal  DATE OF BIRTH:  1961-02-23     DATE OF PROCEDURE:  06/04/2017                                 OPERATIVE REPORT         PREOPERATIVE DIAGNOSIS: Left  hip osteoarthritis.      POSTOPERATIVE DIAGNOSIS:  Left hip osteoarthritis.      PROCEDURE:  Left total hip replacement through an anterior approach   utilizing DePuy THR system, component size 54mm pinnacle cup, a size 36+4 neutral   Altrex liner, a size 6Hi Tri Lock stem with a 36+8.5 delta ceramic   ball.      SURGEON:  Madlyn Frankel. Charlann Boxer, M.D.      ASSISTANT:  Skip Mayer, PA-C     ANESTHESIA:  Spinal.      SPECIMENS:  None.      COMPLICATIONS:  None.      BLOOD LOSS:  1000 cc     DRAINS:  none.      INDICATION OF THE PROCEDURE:  Garrett Hanson is a 57 y.o. male who had   presented to office for evaluation of left hip pain.  Radiographs revealed   progressive degenerative changes with bone-on-bone   articulation to the  hip joint.  The patient had painful limited range of   motion significantly affecting their overall quality of life.  The patient was failing to    respond to conservative measures, and at this point was ready   to proceed with more definitive measures.  The patient has noted progressive   degenerative changes in his hip, progressive problems and dysfunction   with regarding the hip prior to surgery.  Consent was obtained for   benefit of pain relief.  Specific risk of infection, DVT, component   failure, dislocation, need for revision surgery, as well discussion of   the anterior versus posterior approach were reviewed.  Consent was   obtained for benefit of anterior pain relief through an anterior   approach.      PROCEDURE IN DETAIL:  The patient was brought to operative theater.   Once adequate anesthesia, preoperative  antibiotics, 1.5 gm of Vancomycin, 1 gm of Tranexamic Acid, and 10 mg of Decadron administered.   The patient was positioned supine on the OSI Hanna table.  Once adequate   padding of boney process was carried out, we had predraped out the hip, and  used fluoroscopy to confirm orientation of the pelvis and position.      The left hip was then prepped and draped from proximal iliac crest to   mid thigh with shower curtain technique.      Time-out was performed identifying the patient, planned procedure, and   extremity.     An incision was then made 2 cm distal and lateral to the   anterior superior iliac spine extending over the orientation of the   tensor fascia lata muscle and sharp dissection was carried down to the   fascia of the muscle and protractor placed in the soft tissues.      The fascia  was then incised.  The muscle belly was identified and swept   laterally and retractor placed along the superior neck.  Following   cauterization of the circumflex vessels and removing some pericapsular   fat, a second cobra retractor was placed on the inferior neck.  A third   retractor was placed on the anterior acetabulum after elevating the   anterior rectus.  A L-capsulotomy was along the line of the   superior neck to the trochanteric fossa, then extended proximally and   distally.  Tag sutures were placed and the retractors were then placed   intracapsular.  We then identified the trochanteric fossa and   orientation of my neck cut, confirmed this radiographically   and then made a neck osteotomy with the femur on traction.  The femoral   head was removed without difficulty or complication.  Traction was let   off and retractors were placed posterior and anterior around the   acetabulum.      The labrum and foveal tissue were debrided.  I began reaming with a 47 mm   reamer and reamed up to 53mm reamer with good bony bed preparation and a 54mm   cup was chosen.  The final 54mm  Pinnacle cup was then impacted under fluoroscopy  to confirm the depth of penetration and orientation with respect to   abduction.  A screw was placed followed by the hole eliminator.  The final   36+4 neutral Altrex liner was impacted with good visualized rim fit.  The cup was positioned anatomically within the acetabular portion of the pelvis.      At this point, the femur was rolled at 80 degrees.  Further capsule was   released off the inferior aspect of the femoral neck.  I then   released the superior capsule proximally.  The hook was placed laterally   along the femur and elevated manually and held in position with the bed   hook.  The leg was then extended and adducted with the leg rolled to 100   degrees of external rotation.  Once the proximal femur was fully   exposed, I used a box osteotome to set orientation.  I then began   broaching with the starting chili pepper broach and passed this by hand and then broached up to 6.  With the 6 broach in place I chose a high offset neck and did several trial reductions.  The offset was appropriate, leg lengths   appeared to be equal best matched with the +8.5 head ball confirmed radiographically. I felt that the difference between the +5 and the +8.5 was relatively negligible but felt that the enhance stability with the 8.5 head ball was preferred.   Given these findings, I went ahead and dislocated the hip, repositioned all   retractors and positioned the right hip in the extended and abducted position.  The final 6Hi Tri Lock stem was   chosen and it was impacted down to the level of neck cut.  Based on this   and the trial reduction, a 36+8.5 delta ceramic ball was chosen and   impacted onto a clean and dry trunnion, and the hip was reduced.  The   hip had been irrigated throughout the case again at this point.  I did   reapproximate the superior capsular leaflet to the anterior leaflet   using #1 Vicryl.  The fascia of the   tensor fascia  lata muscle was then reapproximated using #1  Vicryl and #0 Stratafix sutures.  The   remaining wound was closed with 2-0 Vicryl and running 4-0 Monocryl.   The hip was cleaned, dried, and dressed sterilely using Dermabond and   Aquacel dressing.  He was then brought   to recovery room in stable condition tolerating the procedure well.    Skip Mayer, PA-C was present for the entirety of the case involved from   preoperative positioning, perioperative retractor management, general   facilitation of the case, as well as primary wound closure as assistant.            Madlyn Frankel Charlann Boxer, M.D.        06/04/2017 12:58 PM

## 2017-06-04 NOTE — Anesthesia Postprocedure Evaluation (Signed)
Anesthesia Post Note  Patient: Garrett Hanson  Procedure(s) Performed: LEFT TOTAL HIP ARTHROPLASTY ANTERIOR APPROACH (Left Hip)     Patient location during evaluation: PACU Anesthesia Type: Spinal and Regional Level of consciousness: awake and alert, patient cooperative and oriented Pain management: pain level controlled Vital Signs Assessment: post-procedure vital signs reviewed and stable Respiratory status: spontaneous breathing, nonlabored ventilation and respiratory function stable Cardiovascular status: blood pressure returned to baseline and stable Postop Assessment: spinal receding, patient able to bend at knees and no apparent nausea or vomiting Anesthetic complications: no    Last Vitals:  Vitals:   06/04/17 1755 06/04/17 1800  BP: 119/81 120/77  Pulse: 64 62  Resp: 14 13  Temp:  36.5 C  SpO2: 99% 99%    Last Pain:  Vitals:   06/04/17 1730  TempSrc:   PainSc: 3                  Emilliano Dilworth,E. Aneka Fagerstrom

## 2017-06-04 NOTE — Interval H&P Note (Signed)
History and Physical Interval Note:  06/04/2017 12:45 PM  Garrett Hanson  has presented today for surgery, with the diagnosis of Left Hip Osteoarthritis  The various methods of treatment have been discussed with the patient and family. After consideration of risks, benefits and other options for treatment, the patient has consented to  Procedure(s): LEFT TOTAL HIP ARTHROPLASTY ANTERIOR APPROACH (Left) as a surgical intervention .  The patient's history has been reviewed, patient examined, no change in status, stable for surgery.  I have reviewed the patient's chart and labs.  Questions were answered to the patient's satisfaction.     Shelda PalMatthew D Vaibhav Fogleman

## 2017-06-04 NOTE — Anesthesia Procedure Notes (Signed)
Date/Time: 06/04/2017 2:07 PM Performed by: Minerva EndsMirarchi, Jarely Juncaj M, CRNA Pre-anesthesia Checklist: Timeout performed, Patient being monitored, Suction available, Emergency Drugs available and Patient identified Patient Re-evaluated:Patient Re-evaluated prior to induction Oxygen Delivery Method: Simple face mask Placement Confirmation: positive ETCO2 and breath sounds checked- equal and bilateral Dental Injury: Teeth and Oropharynx as per pre-operative assessment  Comments: Sedation for spinal anesthesia

## 2017-06-04 NOTE — Discharge Instructions (Addendum)
INSTRUCTIONS AFTER JOINT REPLACEMENT  ° °o Remove items at home which could result in a fall. This includes throw rugs or furniture in walking pathways °o ICE to the affected joint every three hours while awake for 30 minutes at a time, for at least the first 3-5 days, and then as needed for pain and swelling.  Continue to use ice for pain and swelling. You may notice swelling that will progress down to the foot and ankle.  This is normal after surgery.  Elevate your leg when you are not up walking on it.   °o Continue to use the breathing machine you got in the hospital (incentive spirometer) which will help keep your temperature down.  It is common for your temperature to cycle up and down following surgery, especially at night when you are not up moving around and exerting yourself.  The breathing machine keeps your lungs expanded and your temperature down. ° ° °DIET:  As you were doing prior to hospitalization, we recommend a well-balanced diet. ° °Information on my medicine - ELIQUIS® (apixaban) ° °Why was Eliquis® prescribed for you? °Eliquis® was prescribed for you to reduce the risk of blood clots forming after orthopedic surgery.   ° °What do You need to know about Eliquis®? °Take your Eliquis® TWICE DAILY - one tablet in the morning and one tablet in the evening with or without food.  It would be best to take the dose about the same time each day. ° °If you have difficulty swallowing the tablet whole please discuss with your pharmacist how to take the medication safely. ° °Take Eliquis® exactly as prescribed by your doctor and DO NOT stop taking Eliquis® without talking to the doctor who prescribed the medication.  Stopping without other medication to take the place of Eliquis® may increase your risk of developing a clot. ° °After discharge, you should have regular check-up appointments with your healthcare provider that is prescribing your Eliquis®. ° °What do you do if you miss a dose? °If a dose of  ELIQUIS® is not taken at the scheduled time, take it as soon as possible on the same day and twice-daily administration should be resumed.  The dose should not be doubled to make up for a missed dose.  Do not take more than one tablet of ELIQUIS at the same time. ° °Important Safety Information °A possible side effect of Eliquis® is bleeding. You should call your healthcare provider right away if you experience any of the following: °? Bleeding from an injury or your nose that does not stop. °? Unusual colored urine (red or dark brown) or unusual colored stools (red or black). °? Unusual bruising for unknown reasons. °? A serious fall or if you hit your head (even if there is no bleeding). ° °Some medicines may interact with Eliquis® and might increase your risk of bleeding or clotting while on Eliquis®. To help avoid this, consult your healthcare provider or pharmacist prior to using any new prescription or non-prescription medications, including herbals, vitamins, non-steroidal anti-inflammatory drugs (NSAIDs) and supplements. ° °This website has more information on Eliquis® (apixaban): http://www.eliquis.com/eliquis/home ° °DRESSING / WOUND CARE / SHOWERING ° °Keep the surgical dressing until follow up.  The dressing is water proof, so you can shower without any extra covering.  IF THE DRESSING FALLS OFF or the wound gets wet inside, change the dressing with sterile gauze.  Please use good hand washing techniques before changing the dressing.  Do not use any lotions or   creams on the incision until instructed by your surgeon.   ° °ACTIVITY ° °o Increase activity slowly as tolerated, but follow the weight bearing instructions below.   °o No driving for 6 weeks or until further direction given by your physician.  You cannot drive while taking narcotics.  °o No lifting or carrying greater than 10 lbs. until further directed by your surgeon. °o Avoid periods of inactivity such as sitting longer than an hour when not  asleep. This helps prevent blood clots.  °o You may return to work once you are authorized by your doctor.  ° ° ° °WEIGHT BEARING  ° °Weight bearing as tolerated with assist device (walker, cane, etc) as directed, use it as long as suggested by your surgeon or therapist, typically at least 4-6 weeks. ° ° °EXERCISES ° °Results after joint replacement surgery are often greatly improved when you follow the exercise, range of motion and muscle strengthening exercises prescribed by your doctor. Safety measures are also important to protect the joint from further injury. Any time any of these exercises cause you to have increased pain or swelling, decrease what you are doing until you are comfortable again and then slowly increase them. If you have problems or questions, call your caregiver or physical therapist for advice.  ° °Rehabilitation is important following a joint replacement. After just a few days of immobilization, the muscles of the leg can become weakened and shrink (atrophy).  These exercises are designed to build up the tone and strength of the thigh and leg muscles and to improve motion. Often times heat used for twenty to thirty minutes before working out will loosen up your tissues and help with improving the range of motion but do not use heat for the first two weeks following surgery (sometimes heat can increase post-operative swelling).  ° °These exercises can be done on a training (exercise) mat, on the floor, on a table or on a bed. Use whatever works the best and is most comfortable for you.    Use music or television while you are exercising so that the exercises are a pleasant break in your day. This will make your life better with the exercises acting as a break in your routine that you can look forward to.   Perform all exercises about fifteen times, three times per day or as directed.  You should exercise both the operative leg and the other leg as well. ° °Exercises include: °  °• Quad Sets -  Tighten up the muscle on the front of the thigh (Quad) and hold for 5-10 seconds.   °• Straight Leg Raises - With your knee straight (if you were given a brace, keep it on), lift the leg to 60 degrees, hold for 3 seconds, and slowly lower the leg.  Perform this exercise against resistance later as your leg gets stronger.  °• Leg Slides: Lying on your back, slowly slide your foot toward your buttocks, bending your knee up off the floor (only go as far as is comfortable). Then slowly slide your foot back down until your leg is flat on the floor again.  °• Angel Wings: Lying on your back spread your legs to the side as far apart as you can without causing discomfort.  °• Hamstring Strength:  Lying on your back, push your heel against the floor with your leg straight by tightening up the muscles of your buttocks.  Repeat, but this time bend your knee to a comfortable angle, and   push your heel against the floor.  You may put a pillow under the heel to make it more comfortable if necessary.  ° °A rehabilitation program following joint replacement surgery can speed recovery and prevent re-injury in the future due to weakened muscles. Contact your doctor or a physical therapist for more information on knee rehabilitation.  ° ° °CONSTIPATION ° °Constipation is defined medically as fewer than three stools per week and severe constipation as less than one stool per week.  Even if you have a regular bowel pattern at home, your normal regimen is likely to be disrupted due to multiple reasons following surgery.  Combination of anesthesia, postoperative narcotics, change in appetite and fluid intake all can affect your bowels.  ° °YOU MUST use at least one of the following options; they are listed in order of increasing strength to get the job done.  They are all available over the counter, and you may need to use some, POSSIBLY even all of these options:   ° °Drink plenty of fluids (prune juice may be helpful) and high fiber  foods °Colace 100 mg by mouth twice a day  °Senokot for constipation as directed and as needed Dulcolax (bisacodyl), take with full glass of water  °Miralax (polyethylene glycol) once or twice a day as needed. ° °If you have tried all these things and are unable to have a bowel movement in the first 3-4 days after surgery call either your surgeon or your primary doctor.   ° °If you experience loose stools or diarrhea, hold the medications until you stool forms back up.  If your symptoms do not get better within 1 week or if they get worse, check with your doctor.  If you experience "the worst abdominal pain ever" or develop nausea or vomiting, please contact the office immediately for further recommendations for treatment. ° ° °ITCHING:  If you experience itching with your medications, try taking only a single pain pill, or even half a pain pill at a time.  You can also use Benadryl over the counter for itching or also to help with sleep.  ° °TED HOSE STOCKINGS:  Use stockings on both legs until for at least 2 weeks or as directed by physician office. They may be removed at night for sleeping. ° °MEDICATIONS:  See your medication summary on the “After Visit Summary” that nursing will review with you.  You may have some home medications which will be placed on hold until you complete the course of blood thinner medication.  It is important for you to complete the blood thinner medication as prescribed. ° °PRECAUTIONS:  If you experience chest pain or shortness of breath - call 911 immediately for transfer to the hospital emergency department.  ° °If you develop a fever greater that 101 F, purulent drainage from wound, increased redness or drainage from wound, foul odor from the wound/dressing, or calf pain - CONTACT YOUR SURGEON.   °                                                °FOLLOW-UP APPOINTMENTS:  If you do not already have a post-op appointment, please call the office for an appointment to be seen by your  surgeon.  Guidelines for how soon to be seen are listed in your “After Visit Summary”, but are typically between 1-4 weeks   after surgery. ° °OTHER INSTRUCTIONS:  ° °Knee Replacement:  Do not place pillow under knee, focus on keeping the knee straight while resting.  ° °MAKE SURE YOU:  °• Understand these instructions.  °• Get help right away if you are not doing well or get worse.  ° ° °Thank you for letting us be a part of your medical care team.  It is a privilege we respect greatly.  We hope these instructions will help you stay on track for a fast and full recovery!  °  °

## 2017-06-04 NOTE — Progress Notes (Signed)
Portable AP Pelvis X-ray done. 

## 2017-06-04 NOTE — Transfer of Care (Signed)
Immediate Anesthesia Transfer of Care Note  Patient: Garrett Hanson  Procedure(s) Performed: LEFT TOTAL HIP ARTHROPLASTY ANTERIOR APPROACH (Left Hip)  Patient Location: PACU  Anesthesia Type:Spinal  Level of Consciousness: awake and alert   Airway & Oxygen Therapy: Patient Spontanous Breathing and Patient connected to face mask oxygen  Post-op Assessment: Report given to RN and Post -op Vital signs reviewed and stable  Post vital signs: Reviewed and stable  Last Vitals:  Vitals:   06/04/17 1220  BP: 131/84  Pulse: 85  Resp: 16  Temp: 36.9 C  SpO2: 97%    Last Pain:  Vitals:   06/04/17 1233  TempSrc:   PainSc: 3       Patients Stated Pain Goal: 4 (06/04/17 1233)  Complications: No apparent anesthesia complications

## 2017-06-04 NOTE — Anesthesia Procedure Notes (Signed)
Spinal  Patient location during procedure: OR End time: 06/04/2017 2:17 PM Staffing Resident/CRNA: Minerva EndsMirarchi, Kabao Leite M, CRNA Performed: resident/CRNA  Preanesthetic Checklist Completed: patient identified, site marked, surgical consent, pre-op evaluation, timeout performed, IV checked, risks and benefits discussed and monitors and equipment checked Spinal Block Patient position: sitting Prep: ChloraPrep and site prepped and draped Patient monitoring: heart rate, continuous pulse ox, blood pressure and cardiac monitor Approach: midline Location: L3-4 Injection technique: single-shot Needle Needle type: Sprotte  Needle gauge: 24 G Additional Notes Expiration date of tray noted and within date.   Patient tolerated procedure well. Jean RosenthalJackson present assisted and supervised procedure--- prep dry at time of needle insertion

## 2017-06-05 ENCOUNTER — Encounter (HOSPITAL_COMMUNITY): Payer: Self-pay | Admitting: Orthopedic Surgery

## 2017-06-05 LAB — BASIC METABOLIC PANEL
Anion gap: 8 (ref 5–15)
BUN: 15 mg/dL (ref 6–20)
CALCIUM: 8.4 mg/dL — AB (ref 8.9–10.3)
CO2: 24 mmol/L (ref 22–32)
CREATININE: 1.07 mg/dL (ref 0.61–1.24)
Chloride: 103 mmol/L (ref 101–111)
GLUCOSE: 160 mg/dL — AB (ref 65–99)
Potassium: 4.9 mmol/L (ref 3.5–5.1)
Sodium: 135 mmol/L (ref 135–145)

## 2017-06-05 LAB — CBC
HEMATOCRIT: 35 % — AB (ref 39.0–52.0)
Hemoglobin: 12.2 g/dL — ABNORMAL LOW (ref 13.0–17.0)
MCH: 29.8 pg (ref 26.0–34.0)
MCHC: 34.9 g/dL (ref 30.0–36.0)
MCV: 85.6 fL (ref 78.0–100.0)
PLATELETS: 190 10*3/uL (ref 150–400)
RBC: 4.09 MIL/uL — ABNORMAL LOW (ref 4.22–5.81)
RDW: 13.5 % (ref 11.5–15.5)
WBC: 15.9 10*3/uL — ABNORMAL HIGH (ref 4.0–10.5)

## 2017-06-05 NOTE — Evaluation (Signed)
Physical Therapy Evaluation Patient Details Name: Garrett FendtMark J Hanson MRN: [id number] DOB: 10/27/1960 Today's Date: 06/05/2017   History of Present Illness  Garrett Hanson  Clinical Impression  The patient is progressing well. Plans DC to  Home after APT this PM Pt admitted with above diagnosis. Pt currently with functional limitations due to the deficits listed below (see PT Problem List).  Pt will benefit from skilled PT to increase their independence and safety with mobility to allow discharge to the venue listed below.       Follow Up Recommendations No PT follow up    Equipment Recommendations  Rolling walker with 5" wheels    Recommendations for Other Services       Precautions / Restrictions Precautions Precautions: Fall      Mobility  Bed Mobility Overal bed mobility: Needs Assistance Bed Mobility: Supine to Sit;Sit to Supine     Supine to sit: Min assist Sit to supine: Min assist   General bed mobility comments: assist with them left leg, used  lifter to self assist  Transfers Overall transfer level: Needs assistance Equipment used: Rolling walker (2 wheeled) Transfers: Sit to/from Stand Sit to Stand: Min guard         General transfer comment: cues for left leg position  Ambulation/Gait Ambulation/Gait assistance: Min assist Ambulation Distance (Feet): 200 Feet Assistive device: Rolling walker (2 wheeled) Gait Pattern/deviations: Step-to pattern Gait velocity: decr.   General Gait Details: cues  for sequence. the patient's RW delivered and  patient felt too narrow. Requested a wide RW.  Stairs            Wheelchair Mobility    Modified Rankin (Stroke Patients Only)       Balance                                             Pertinent Vitals/Pain Pain Assessment: 0-10 Pain Score: 4  Pain Location: left thigh Pain Descriptors / Indicators: Discomfort;Dull;Grimacing Pain Intervention(s): Premedicated before  session;Repositioned;Ice applied;Monitored during session    Home Living Family/patient expects to be discharged to:: Private residence Living Arrangements: Spouse/significant other Available Help at Discharge: Family Type of Home: House Home Access: Stairs to enter Entrance Stairs-Rails: None   Home Layout: Multi-level Home Equipment: Cane - single point;Crutches      Prior Function Level of Independence: Independent         Comments: cane      Hand Dominance        Extremity/Trunk Assessment   Upper Extremity Assessment Upper Extremity Assessment: Overall WFL for tasks assessed    Lower Extremity Assessment Lower Extremity Assessment: LLE deficits/detail LLE Deficits / Details: advances the leg       Communication   Communication: No difficulties  Cognition Arousal/Alertness: Awake/alert Behavior During Therapy: WFL for tasks assessed/performed Overall Cognitive Status: Within Functional Limits for tasks assessed                                        General Comments      Exercises Total Joint Exercises Ankle Circles/Pumps: AROM;Both;10 reps Quad Sets: AROM;Both;10 reps Short Arc Quad: AROM;Left;10 reps Heel Slides: AAROM;Left;10 reps Hip ABduction/ADduction: AAROM;Left;10 reps Long Arc Quad: AROM;Left;10 reps   Assessment/Plan    PT Assessment Patient needs continued PT  services  PT Problem List Decreased strength;Decreased range of motion;Decreased activity tolerance;Decreased mobility;Decreased knowledge of use of DME;Decreased safety awareness;Decreased knowledge of precautions;Pain       PT Treatment Interventions DME instruction;Therapeutic exercise;Gait training;Stair training;Functional mobility training;Therapeutic activities;Patient/family education    PT Goals (Current goals can be found in the Care Plan section)  Acute Rehab PT Goals Patient Stated Goal: to go home PT Goal Formulation: With patient Time For Goal  Achievement: 06/07/17 Potential to Achieve Goals: Good    Frequency 7X/week   Barriers to discharge        Co-evaluation               AM-PAC PT "6 Clicks" Daily Activity  Outcome Measure Difficulty turning over in bed (including adjusting bedclothes, sheets and blankets)?: A Little Difficulty moving from lying on back to sitting on the side of the bed? : A Little Difficulty sitting down on and standing up from a chair with arms (e.g., wheelchair, bedside commode, etc,.)?: A Little Help needed moving to and from a bed to chair (including a wheelchair)?: A Little Help needed walking in hospital room?: A Little Help needed climbing 3-5 steps with a railing? : A Little 6 Click Score: 18    End of Session   Activity Tolerance: Patient tolerated treatment well Patient left: in chair;with call bell/phone within reach Nurse Communication: Mobility status PT Visit Diagnosis: Unsteadiness on feet (R26.81)    Time: 4098-1191 PT Time Calculation (min) (ACUTE ONLY): 63 min   Charges:   PT Evaluation $PT Eval Low Complexity: 1 Low PT Treatments $Gait Training: 8-22 mins $Therapeutic Exercise: 8-22 mins $Self Care/Home Management: 8-22   PT G CodesBlanchard Hanson PT [phone]   Garrett Hanson 06/05/2017, 12:59 PM

## 2017-06-05 NOTE — Progress Notes (Signed)
OT Cancellation Note  Patient Details Name: Garrett Hanson MRN: [id number] DOB: [birth year]   Cancelled Treatment:    Reason Eval/Treat Not Completed: OT screened, no needs identified, will sign off  Pt has needed DME and familiar with ADL  Strategies. Thanks, Lise AuerLori Adriane Guglielmo, [place] [phone]  Einar CrowEDDING, [name] D 06/05/2017, 10:58 AM

## 2017-06-05 NOTE — Progress Notes (Signed)
    Durable Medical Equipment  (From admission, onward)        Start     Ordered   06/05/17 1035  For home use only DME Walker rolling  Once    Comments:  Wide RW  Question Answer Comment  Patient needs a walker to treat with the following condition Surgery follow-up   Patient needs a walker to treat with the following condition Surgery, elective      06/05/17 1035

## 2017-06-05 NOTE — Progress Notes (Signed)
Spoke with Izetta DakinMatthew Babbish,PA in regards to patients blood pressure. Give 250mL bolus and hold all morning blood pressure medications.

## 2017-06-05 NOTE — Progress Notes (Signed)
     Subjective: 1 Day Post-Op Procedure(s) (LRB): LEFT TOTAL HIP ARTHROPLASTY ANTERIOR APPROACH (Left)   Patient reports pain as mild, pain controlled.  No events throughout the night. Some pain yesterday, but better today while staying on top of the medications.  Ready to be discharged home.  Objective:   VITALS:   Vitals:   06/04/17 2241 06/05/17 0545  BP: 122/76 99/69  Pulse: 71 72  Resp: 15 15  Temp: 97.9 F (36.6 C) 98.1 F (36.7 C)  SpO2: 99% 96%    Dorsiflexion/Plantar flexion intact Incision: dressing C/D/I No cellulitis present Compartment soft  LABS Recent Labs    06/04/17 1555 06/05/17 0543  HGB 12.6* 12.2*  HCT 37.0* 35.0*  WBC  --  15.9*  PLT  --  190    Recent Labs    06/04/17 1555 06/05/17 0543  NA 141 135  K 4.8 4.9  BUN  --  15  CREATININE  --  1.07  GLUCOSE 103* 160*     Assessment/Plan: 1 Day Post-Op Procedure(s) (LRB): LEFT TOTAL HIP ARTHROPLASTY ANTERIOR APPROACH (Left) Foley cath d/c'ed Advance diet Up with therapy D/C IV fluids Discharge home [facility] Follow up with OLIN,Tanetta Fuhriman D in 2 weeks.  Contact information:  [facility] [address], [address] [place] [id number] [phone]    Obese (BMI 30-39.9) Estimated body mass index is 37.75 kg/m as calculated from the following:   Height as of this encounter: 6\' 2"  (1.88 m).   Weight as of this encounter: 133.4 kg (294 lb). Patient also counseled that weight may inhibit the healing process Patient counseled that losing weight will help with future health issues      Anastasio AuerbachMatthew S. Monnie Gudgel   PAC  06/05/2017, 9:24 AM

## 2017-06-05 NOTE — Progress Notes (Signed)
Physical Therapy Treatment Patient Details Name: Garrett FendtMark J Hanson MRN: [id number] DOB: 12/19/1960 Today's Date: 06/05/2017    History of Present Illness Garrett Hanson    PT Comments    Progressing well. Ambulated x 20' with cane. Recommended that he use RW most of the time. Ready for DC.  Follow Up Recommendations  No PT follow up     Equipment Recommendations  Rolling walker with 5" wheels    Recommendations for Other Services       Precautions / Restrictions Precautions Precautions: Fall    Mobility  Bed Mobility Overal bed mobility: Needs Assistance Bed Mobility: Supine to Sit;Sit to Supine     Supine to sit: Min assist Sit to supine: Min assist   General bed mobility comments: oob  Transfers Overall transfer level: Needs assistance Equipment used: Rolling walker (2 wheeled) Transfers: Sit to/from Stand Sit to Stand: Supervision         General transfer comment: cues for left leg position  Ambulation/Gait Ambulation/Gait assistance: Supervision Ambulation Distance (Feet): 800 Feet Assistive device: Rolling walker (2 wheeled);Straight cane Gait Pattern/deviations: Step-to pattern;Step-through pattern Gait velocity: decr.   General Gait Details: cues  for sequence. Ambulated x 20' wit  SPC on right. instructed to always have either cane or RW..   Stairs Stairs: Yes   Stair Management: One rail Left;Step to pattern;Forwards;With cane   General stair comments: practiced 4 steps  x 3   Wheelchair Mobility    Modified Rankin (Stroke Patients Only)       Balance                                            Cognition Arousal/Alertness: Awake/alert Behavior During Therapy: WFL for tasks assessed/performed Overall Cognitive Status: Within Functional Limits for tasks assessed                                        Exercises    General Comments        Pertinent Vitals/Pain Pain Assessment: 0-10 Pain Score: 3   Pain Location: left thigh Pain Descriptors / Indicators: Discomfort;Dull;Grimacing Pain Intervention(s): Monitored during session    Home Living Family/patient expects to be discharged to:: Private residence Living Arrangements: Spouse/significant other Available Help at Discharge: Family Type of Home: House Home Access: Stairs to enter Entrance Stairs-Rails: None Home Layout: Multi-level Home Equipment: Cane - single point;Crutches      Prior Function Level of Independence: Independent      Comments: cane    PT Goals (current goals can now be found in the care plan section) Acute Rehab PT Goals Patient Stated Goal: to go home PT Goal Formulation: With patient Time For Goal Achievement: 06/07/17 Potential to Achieve Goals: Good Progress towards PT goals: Progressing toward goals    Frequency    7X/week      PT Plan Current plan remains appropriate    Co-evaluation              AM-PAC PT "6 Clicks" Daily Activity  Outcome Measure  Difficulty turning over in bed (including adjusting bedclothes, sheets and blankets)?: A Little Difficulty moving from lying on back to sitting on the side of the bed? : A Little Difficulty sitting down on and standing up from a chair with arms (e.g.,  wheelchair, bedside commode, etc,.)?: A Little Help needed moving to and from a bed to chair (including a wheelchair)?: A Little Help needed walking in hospital room?: A Little Help needed climbing 3-5 steps with a railing? : A Little 6 Click Score: 18    End of Session   Activity Tolerance: Patient tolerated treatment well Patient left: in chair;with call bell/phone within reach Nurse Communication: Mobility status PT Visit Diagnosis: Unsteadiness on feet (R26.81)     Time: 3086-5784 PT Time Calculation (min) (ACUTE ONLY): 33 min  Charges:  $Gait Training: 23-37 mins                    G Codes:          Rada Hay 06/05/2017, 3:20 PM

## 2017-06-10 NOTE — Discharge Summary (Signed)
Physician Discharge Summary  Patient ID: Garrett FendtMark J Hanson MRN: [id number] DOB/AGE: 26-Nov-1960 57 y.o.  Admit date: 06/04/2017 Discharge date: 06/05/2017   Procedures:  Procedure(s) (LRB): LEFT TOTAL HIP ARTHROPLASTY ANTERIOR APPROACH (Left)  Attending Physician:  Dr. Durene RomansMatthew Olin   Admission Diagnoses:   Left hip primary OA / pain  Discharge Diagnoses:  Principal Problem:   S/P left THA, AA Active Problems:   Class 2 severe obesity due to excess calories with serious comorbidity and body mass index (BMI) of 37.0 to 37.9 in adult [facility](HCC)   S/P hip replacement  Past Medical History:  Diagnosis Date  . Arthritis    "left knee; probably in the shoulders; left hip" (02/05/2017)  . Chronic bronchitis (HCC)    "get it ~ q other year" (02/05/2017)  . Chronic urticaria   . CKD (chronic kidney disease), stage II    pt denies this hx on 02/05/2017  . Complication of anesthesia    pt states awaken during colonscopy   . Dysrhythmia    a fib  . Essential hypertension   . History of blood transfusion 1961  . History of common duodenal ulcer 1980  . History of kidney stones   . Lymphadenopathy    a. evaluated by heme-onc 04/2016, under surveillance.  . OSA on CPAP dx'd ~ 09/2016  . Persistent atrial fibrillation (HCC)   . Pneumonia    "X2" (02/05/2017)  . Pure hypercholesterolemia     HPI:    Garrett Hanson, 57 y.o. male, has a history of pain and functional disability in the left hip(s) due to arthritis and patient has failed non-surgical conservative treatments for greater than 12 weeks to include NSAID's and/or analgesics and activity modification.  Onset of symptoms was gradual starting  years ago with rapidlly worsening course since that time.The patient noted no past surgery on the left hip(s).  Patient currently rates pain in the left hip at 9 out of 10 with activity. Patient has night pain, worsening of pain with activity and weight bearing, trendelenberg gait, pain that  interfers with activities of daily living and pain with passive range of motion. Patient has evidence of periarticular osteophytes and joint space narrowing by imaging studies. This condition presents safety issues increasing the risk of falls. There is no current active infection.  Risks, benefits and expectations were discussed with the patient.  Risks including but not limited to the risk of anesthesia, blood clots, nerve damage, blood vessel damage, failure of the prosthesis, infection and up to and including death.  Patient understand the risks, benefits and expectations and wishes to proceed with surgery.   PCP: Daisy Florooss, Charles Alan, MD   Discharged Condition: good  Hospital Course:  Patient underwent the above stated procedure on 06/04/2017. Patient tolerated the procedure well and brought to the recovery room in good condition and subsequently to the floor.  POD #1 BP: 99/69 ; Pulse: 72 ; Temp: 98.1 F (36.7 C) ; Resp: 15 Patient reports pain as mild, pain controlled.  No events throughout the night. Some pain yesterday, but better today while staying on top of the medications.  Ready to be discharged home. Dorsiflexion/plantar flexion intact, incision: dressing C/D/I, no cellulitis present and compartment soft.   LABS  Basename    HGB     12.2  HCT     35.0    Discharge Exam: General appearance: alert, cooperative and no distress Extremities: Homans sign is negative, no sign of DVT, no edema, redness or  tenderness in the calves or thighs and no ulcers, gangrene or trophic changes  Disposition: Home with follow up in 2 weeks   Follow-up Information    Durene Romans, MD. Schedule an appointment as soon as possible for a visit in 2 weeks.   Specialty:  Orthopedic Surgery Contact information: [address] [place] [address] [place] [address] [phone]           Discharge Instructions    Call MD / Call 911   Complete by:  As directed    If you experience chest pain or  shortness of breath, CALL 911 and be transported to the hospital emergency room.  If you develope a fever above 101 F, pus (white drainage) or increased drainage or redness at the wound, or calf pain, call your surgeon's office.   Change dressing   Complete by:  As directed    Maintain surgical dressing until follow up in the clinic. If the edges start to pull up, may reinforce with tape. If the dressing is no longer working, may remove and cover with gauze and tape, but must keep the area dry and clean.  Call with any questions or concerns.   Constipation Prevention   Complete by:  As directed    Drink plenty of fluids.  Prune juice may be helpful.  You may use a stool softener, such as Colace (over the counter) 100 mg twice a day.  Use MiraLax (over the counter) for constipation as needed.   Diet - low sodium heart healthy   Complete by:  As directed    Discharge instructions   Complete by:  As directed    Maintain surgical dressing until follow up in the clinic. If the edges start to pull up, may reinforce with tape. If the dressing is no longer working, may remove and cover with gauze and tape, but must keep the area dry and clean.  Follow up in 2 weeks at [facility]. Call with any questions or concerns.   Increase activity slowly as tolerated   Complete by:  As directed    Weight bearing as tolerated with assist device (walker, cane, etc) as directed, use it as long as suggested by your surgeon or therapist, typically at least 4-6 weeks.   TED hose   Complete by:  As directed    Use stockings (TED hose) for 2 weeks on both leg(s).  You may remove them at night for sleeping.      Allergies as of 06/05/2017      Reactions   Contrast Media [iodinated Diagnostic Agents] Shortness Of Breath, Palpitations   Broke out in sweats.   Penicillins Anaphylaxis   Has patient had a PCN reaction causing immediate rash, facial/tongue/throat swelling, SOB or lightheadedness with hypotension:  Yes Has patient had a PCN reaction causing severe rash involving mucus membranes or skin necrosis: No Has patient had a PCN reaction that required hospitalization:Yes Has patient had a PCN reaction occurring within the last 10 years: No If all of the above answers are "NO", then may proceed with Cephalosporin use.   Singulair [montelukast Sodium] Swelling   Lips, mouth, and hand swelling      Medication List    STOP taking these medications   HYDROcodone-acetaminophen 5-325 MG tablet Commonly known as:  NORCO/VICODIN Replaced by:  HYDROcodone-acetaminophen 7.5-325 MG tablet     TAKE these medications   amLODipine 2.5 MG tablet Commonly known as:  NORVASC Take 1 tablet (2.5 mg total) by mouth  daily.   apixaban 5 MG Tabs tablet Commonly known as:  ELIQUIS Take 1 tablet (5 mg total) by mouth 2 (two) times daily.   atorvastatin 40 MG tablet Commonly known as:  LIPITOR Take 40 mg by mouth every evening.   docusate sodium 100 MG capsule Commonly known as:  COLACE Take 1 capsule (100 mg total) by mouth 2 (two) times daily.   dofetilide 500 MCG capsule Commonly known as:  TIKOSYN Take 1 capsule (500 mcg total) by mouth 2 (two) times daily.   famotidine 40 MG tablet Commonly known as:  PEPCID Take 40 mg by mouth 2 (two) times daily.   ferrous sulfate 325 (65 FE) MG tablet Commonly known as:  FERROUSUL Take 1 tablet (325 mg total) by mouth 3 (three) times daily with meals.   fluticasone 27.5 MCG/SPRAY nasal spray Commonly known as:  VERAMYST Place 1 spray into the nose daily as needed for allergies.   HYDROcodone-acetaminophen 7.5-325 MG tablet Commonly known as:  NORCO Take 1-2 tablets by mouth every 4 (four) hours as needed for moderate pain. Replaces:  HYDROcodone-acetaminophen 5-325 MG tablet   hydrocortisone cream 1 % Apply topically every 6 (six) hours as needed for itching.   lactobacillus acidophilus Tabs tablet Take 1 tablet by mouth daily.   losartan 100  MG tablet Commonly known as:  COZAAR Take 100 mg by mouth every morning.   methocarbamol 500 MG tablet Commonly known as:  ROBAXIN Take 1 tablet (500 mg total) by mouth every 6 (six) hours as needed for muscle spasms. What changed:    how much to take  when to take this   metoprolol tartrate 25 MG tablet Commonly known as:  LOPRESSOR Take 25 mg by mouth 2 (two) times daily.   nystatin powder Commonly known as:  MYCOSTATIN/NYSTOP Apply 1 application topically 2 (two) times daily as needed.   polyethylene glycol packet Commonly known as:  MIRALAX / GLYCOLAX Take 17 g by mouth 2 (two) times daily.   potassium chloride SA 20 MEQ tablet Commonly known as:  K-DUR,KLOR-CON Take 1 tablet (20 mEq total) by mouth 2 (two) times daily.   spironolactone 25 MG tablet Commonly known as:  ALDACTONE Take 1 tablet (25 mg total) by mouth daily.            Discharge Care Instructions  (From admission, onward)        Start     Ordered   06/05/17 0000  Change dressing    Comments:  Maintain surgical dressing until follow up in the clinic. If the edges start to pull up, may reinforce with tape. If the dressing is no longer working, may remove and cover with gauze and tape, but must keep the area dry and clean.  Call with any questions or concerns.   06/05/17 4098       Signed: Anastasio Auerbach. Shanda Cadotte   PA-C  06/10/2017, 4:06 PM

## 2017-06-18 DIAGNOSIS — G4733 Obstructive sleep apnea (adult) (pediatric): Secondary | ICD-10-CM | POA: Diagnosis not present

## 2017-07-03 ENCOUNTER — Other Ambulatory Visit (HOSPITAL_COMMUNITY): Payer: Self-pay | Admitting: *Deleted

## 2017-07-03 DIAGNOSIS — R269 Unspecified abnormalities of gait and mobility: Secondary | ICD-10-CM | POA: Diagnosis not present

## 2017-07-03 DIAGNOSIS — G4733 Obstructive sleep apnea (adult) (pediatric): Secondary | ICD-10-CM | POA: Diagnosis not present

## 2017-07-03 MED ORDER — SPIRONOLACTONE 25 MG PO TABS: 25.0000 mg | ORAL_TABLET | Freq: Every day | ORAL | 3 refills | Status: DC

## 2017-07-18 DIAGNOSIS — Z96642 Presence of left artificial hip joint: Secondary | ICD-10-CM | POA: Diagnosis not present

## 2017-07-18 DIAGNOSIS — Z471 Aftercare following joint replacement surgery: Secondary | ICD-10-CM | POA: Diagnosis not present

## 2017-07-31 ENCOUNTER — Other Ambulatory Visit (HOSPITAL_COMMUNITY): Payer: Self-pay | Admitting: Nurse Practitioner

## 2017-09-04 DIAGNOSIS — M25552 Pain in left hip: Secondary | ICD-10-CM | POA: Diagnosis not present

## 2017-09-04 DIAGNOSIS — Z471 Aftercare following joint replacement surgery: Secondary | ICD-10-CM | POA: Diagnosis not present

## 2017-09-04 DIAGNOSIS — Z96642 Presence of left artificial hip joint: Secondary | ICD-10-CM | POA: Diagnosis not present

## 2017-09-04 DIAGNOSIS — M1612 Unilateral primary osteoarthritis, left hip: Secondary | ICD-10-CM | POA: Diagnosis not present

## 2017-09-20 DIAGNOSIS — M25552 Pain in left hip: Secondary | ICD-10-CM | POA: Diagnosis not present

## 2017-09-24 DIAGNOSIS — M25552 Pain in left hip: Secondary | ICD-10-CM | POA: Diagnosis not present

## 2017-09-27 DIAGNOSIS — M25552 Pain in left hip: Secondary | ICD-10-CM | POA: Diagnosis not present

## 2017-10-01 DIAGNOSIS — M25552 Pain in left hip: Secondary | ICD-10-CM | POA: Diagnosis not present

## 2017-10-03 DIAGNOSIS — M25552 Pain in left hip: Secondary | ICD-10-CM | POA: Diagnosis not present

## 2017-10-08 DIAGNOSIS — M25552 Pain in left hip: Secondary | ICD-10-CM | POA: Diagnosis not present

## 2017-10-10 DIAGNOSIS — M25552 Pain in left hip: Secondary | ICD-10-CM | POA: Diagnosis not present

## 2017-10-16 DIAGNOSIS — Z96642 Presence of left artificial hip joint: Secondary | ICD-10-CM | POA: Diagnosis not present

## 2017-10-17 DIAGNOSIS — M25552 Pain in left hip: Secondary | ICD-10-CM | POA: Diagnosis not present

## 2017-10-22 DIAGNOSIS — M25552 Pain in left hip: Secondary | ICD-10-CM | POA: Diagnosis not present

## 2017-10-29 DIAGNOSIS — M25552 Pain in left hip: Secondary | ICD-10-CM | POA: Diagnosis not present

## 2017-10-31 DIAGNOSIS — M25552 Pain in left hip: Secondary | ICD-10-CM | POA: Diagnosis not present

## 2017-11-04 ENCOUNTER — Other Ambulatory Visit (HOSPITAL_COMMUNITY): Payer: Self-pay | Admitting: Nurse Practitioner

## 2017-11-05 DIAGNOSIS — M25552 Pain in left hip: Secondary | ICD-10-CM | POA: Diagnosis not present

## 2017-11-07 DIAGNOSIS — M25552 Pain in left hip: Secondary | ICD-10-CM | POA: Diagnosis not present

## 2017-11-14 DIAGNOSIS — M25552 Pain in left hip: Secondary | ICD-10-CM | POA: Diagnosis not present

## 2017-11-20 DIAGNOSIS — M25552 Pain in left hip: Secondary | ICD-10-CM | POA: Diagnosis not present

## 2017-11-21 DIAGNOSIS — M25552 Pain in left hip: Secondary | ICD-10-CM | POA: Diagnosis not present

## 2017-11-26 DIAGNOSIS — M25552 Pain in left hip: Secondary | ICD-10-CM | POA: Diagnosis not present

## 2017-11-28 ENCOUNTER — Encounter (HOSPITAL_COMMUNITY): Payer: Self-pay | Admitting: Nurse Practitioner

## 2017-11-28 ENCOUNTER — Ambulatory Visit (HOSPITAL_COMMUNITY)
Admission: RE | Admit: 2017-11-28 | Discharge: 2017-11-28 | Disposition: A | Discharge status: Home / Self Care | Payer: BLUE CROSS/BLUE SHIELD | Source: Ambulatory Visit | Attending: Nurse Practitioner | Admitting: Nurse Practitioner

## 2017-11-28 VITALS — BP 108/72 | HR 91 | Ht 74.0 in | Wt 296.0 lb

## 2017-11-28 DIAGNOSIS — Z91041 Radiographic dye allergy status: Secondary | ICD-10-CM | POA: Insufficient documentation

## 2017-11-28 DIAGNOSIS — I129 Hypertensive chronic kidney disease with stage 1 through stage 4 chronic kidney disease, or unspecified chronic kidney disease: Secondary | ICD-10-CM | POA: Insufficient documentation

## 2017-11-28 DIAGNOSIS — N182 Chronic kidney disease, stage 2 (mild): Secondary | ICD-10-CM | POA: Diagnosis not present

## 2017-11-28 DIAGNOSIS — G4733 Obstructive sleep apnea (adult) (pediatric): Secondary | ICD-10-CM | POA: Insufficient documentation

## 2017-11-28 DIAGNOSIS — Z79899 Other long term (current) drug therapy: Secondary | ICD-10-CM | POA: Insufficient documentation

## 2017-11-28 DIAGNOSIS — Z888 Allergy status to other drugs, medicaments and biological substances status: Secondary | ICD-10-CM | POA: Insufficient documentation

## 2017-11-28 DIAGNOSIS — Z6837 Body mass index (BMI) 37.0-37.9, adult: Secondary | ICD-10-CM | POA: Insufficient documentation

## 2017-11-28 DIAGNOSIS — Z96642 Presence of left artificial hip joint: Secondary | ICD-10-CM | POA: Diagnosis not present

## 2017-11-28 DIAGNOSIS — Z981 Arthrodesis status: Secondary | ICD-10-CM | POA: Insufficient documentation

## 2017-11-28 DIAGNOSIS — Z880 Allergy status to penicillin: Secondary | ICD-10-CM | POA: Diagnosis not present

## 2017-11-28 DIAGNOSIS — Z96652 Presence of left artificial knee joint: Secondary | ICD-10-CM | POA: Diagnosis not present

## 2017-11-28 DIAGNOSIS — I481 Persistent atrial fibrillation: Secondary | ICD-10-CM | POA: Diagnosis not present

## 2017-11-28 DIAGNOSIS — Z87442 Personal history of urinary calculi: Secondary | ICD-10-CM | POA: Insufficient documentation

## 2017-11-28 DIAGNOSIS — I4891 Unspecified atrial fibrillation: Secondary | ICD-10-CM | POA: Diagnosis present

## 2017-11-28 DIAGNOSIS — E669 Obesity, unspecified: Secondary | ICD-10-CM | POA: Insufficient documentation

## 2017-11-28 DIAGNOSIS — Z7901 Long term (current) use of anticoagulants: Secondary | ICD-10-CM | POA: Diagnosis not present

## 2017-11-28 DIAGNOSIS — Z8249 Family history of ischemic heart disease and other diseases of the circulatory system: Secondary | ICD-10-CM | POA: Diagnosis not present

## 2017-11-28 DIAGNOSIS — I480 Paroxysmal atrial fibrillation: Secondary | ICD-10-CM

## 2017-11-28 DIAGNOSIS — E7800 Pure hypercholesterolemia, unspecified: Secondary | ICD-10-CM | POA: Diagnosis not present

## 2017-11-28 DIAGNOSIS — Z8701 Personal history of pneumonia (recurrent): Secondary | ICD-10-CM | POA: Insufficient documentation

## 2017-11-28 LAB — BASIC METABOLIC PANEL
Anion gap: 10 (ref 5–15)
BUN: 16 mg/dL (ref 6–20)
CALCIUM: 9 mg/dL (ref 8.9–10.3)
CO2: 22 mmol/L (ref 22–32)
Chloride: 107 mmol/L (ref 98–111)
Creatinine, Ser: 1.31 mg/dL — ABNORMAL HIGH (ref 0.61–1.24)
GFR calc Af Amer: >60 mL/min (ref >60)
GFR, EST NON AFRICAN AMERICAN: 58 mL/min — AB (ref >60)
Glucose, Bld: 99 mg/dL (ref 70–99)
Potassium: 4.3 mmol/L (ref 3.5–5.1)
SODIUM: 139 mmol/L (ref 135–145)

## 2017-11-28 LAB — CBC
HCT: 46.9 % (ref 39.0–52.0)
Hemoglobin: 15.4 g/dL (ref 13.0–17.0)
MCH: 28.3 pg (ref 26.0–34.0)
MCHC: 32.8 g/dL (ref 30.0–36.0)
MCV: 86.1 fL (ref 78.0–100.0)
PLATELETS: 267 10*3/uL (ref 150–400)
RBC: 5.45 MIL/uL (ref 4.22–5.81)
RDW: 13.5 % (ref 11.5–15.5)
WBC: 8.3 10*3/uL (ref 4.0–10.5)

## 2017-11-28 LAB — MAGNESIUM: MAGNESIUM: 1.9 mg/dL (ref 1.7–2.4)

## 2017-11-28 NOTE — Progress Notes (Signed)
Primary Care Physician: Daisy Floro, MD Referring Physician: [facility] Cardiologist: Dr. Royann Shivers  (pending establishing)   Garrett Hanson is a 57 y.o. male with a h/o HTN, obesity, recently diagnosed sleep apnea, started CPAP 4 weeks ago. He was found to be in rate controlled afib at time of physical mid summer and was sent to Cardiology. He was set up for cardioversion, 9/24,  and unfortunately would not shock out. He came into  the afib clinic for other options.  He reports that 20 years ago he had a couple of episodes of rapid heart rate and possibly Dr. Graciela Husbands, can't for sure remember the name, did cardiac mapping, but no ablation was done and since this was a very infrequent thing, no other treatment was indicated at the time. Now, pt thinks he could have been in afib for a year, as it was not present on his last physical exam. He may have had some intermittent episodes before that. He went to [place] in May and was more short of breath when they were out exploring the area. Echo showed severe enlargement of left and right atrium, 47 mm with volume of 93.  He  was more symptomatic with fatigue, dyspnea, hard to do his usual activities.  F/u in afib clinic 10/26, f/u tikosyn start, he is staying in SR, feels some better, but he has just been in SR x one week and has just been using his CPAP for a few weeks, so he anticipates that he will be feeling better with time. He is very serious that he wants to lose weight and become more active.  F/u in afib clinic 12/7, one month after tikosyn start. He is staying in SR. Bp has improved on spironolactone and K+ levels improved. He feels well. Being complaint with CPAP and Tikosyn, apixaban.  F/u with afib clinic, 8/8. He went back into afib yesterday. He does not  know of any trigger. Continues on Tikosyn and eliquis, no missed doses. He has noted some low BP readings at home. Feels sluggish in afib.  Today, he denies symptoms of  palpitations, chest pain,   orthopnea, PND, lower extremity edema, dizziness, presyncope, syncope, or neurologic sequela. positive for fatigue and mild shortness of breath. The patient is tolerating medications without difficulties and is otherwise without complaint today.   Past Medical History:  Diagnosis Date  . Arthritis    "left knee; probably in the shoulders; left hip" (02/05/2017)  . Chronic bronchitis (HCC)    "get it ~ q other year" (02/05/2017)  . Chronic urticaria   . CKD (chronic kidney disease), stage II    pt denies this hx on 02/05/2017  . Complication of anesthesia    pt states awaken during colonscopy   . Dysrhythmia    a fib  . Essential hypertension   . History of blood transfusion 1961  . History of common duodenal ulcer 1980  . History of kidney stones   . Lymphadenopathy    a. evaluated by heme-onc 04/2016, under surveillance.  . OSA on CPAP dx'd ~ 09/2016  . Persistent atrial fibrillation (HCC)   . Pneumonia    "X2" (02/05/2017)  . Pure hypercholesterolemia    Past Surgical History:  Procedure Laterality Date  . ANTERIOR CERVICAL DECOMP/DISCECTOMY FUSION  04/2005   "used piece from my hip"  . CARDIOVERSION N/A 01/14/2017   Procedure: CARDIOVERSION;  Surgeon: Jake Bathe, MD;  Location: [facility] ENDOSCOPY;  Service: Cardiovascular;  Laterality: N/A;  .  CARDIOVERSION N/A 02/07/2017   Procedure: CARDIOVERSION;  Surgeon: Chilton Siandolph, Tiffany, MD;  [facility] END[facility];  Service: Cardiovascular;  Laterality: N/A;  . COLONOSCOPY W/ BIOPSIES AND POLYPECTOMY  ~ 2012   "benign"  . ELBOW SURGERY Right    "Tommy John surgery"  . EXTRACORPOREAL SHOCK WAVE LITHOTRIPSY  1990s  . INGUINAL HERNIA REPAIR Right 1980  . JOINT REPLACEMENT     left knee  . KNEE ARTHROSCOPY Right ~ 1990  . SHOULDER ARTHROSCOPY W/ ROTATOR CUFF REPAIR Right 2000s X 2  . TONSILLECTOMY     "I think I did"  . TOTAL HIP ARTHROPLASTY Left 06/04/2017   Procedure: LEFT TOTAL HIP ARTHROPLASTY ANTERIOR  APPROACH;  Surgeon: Durene Romanslin, Matthew, MD;  Location: [facility] ORS;  Service: Orthopedics;  Laterality: Left;  . TOTAL KNEE ARTHROPLASTY Left 02/07/2015   Procedure: LEFT TOTAL KNEE ARTHROPLASTY;  Surgeon: Durene RomansMatthew Olin, MD;  Location: [facility] ORS;  Service: Orthopedics;  Laterality: Left;    Current Outpatient Medications  Medication Sig Dispense Refill  . atorvastatin (LIPITOR) 40 MG tablet Take 40 mg by mouth every evening.    . docusate sodium (COLACE) 100 MG capsule Take 1 capsule (100 mg total) by mouth 2 (two) times daily. 10 capsule 0  . dofetilide (TIKOSYN) 500 MCG capsule TAKE 1 CAPSULE BY MOUTH TWICE DAILY 180 capsule 2  . ELIQUIS 5 MG TABS tablet TAKE 1 TABLET TWICE A DAY 180 tablet 2  . famotidine (PEPCID) 40 MG tablet Take 40 mg by mouth 2 (two) times daily.    . ferrous sulfate (FERROUSUL) 325 (65 FE) MG tablet Take 1 tablet (325 mg total) by mouth 3 (three) times daily with meals.  3  . fluticasone (VERAMYST) 27.5 MCG/SPRAY nasal spray Place 1 spray into the nose daily as needed for allergies.     Marland Kitchen. HYDROcodone-acetaminophen (NORCO) 7.5-325 MG tablet Take 1-2 tablets by mouth every 4 (four) hours as needed for moderate pain. 60 tablet 0  . hydrocortisone cream 1 % Apply topically every 6 (six) hours as needed for itching. 30 g 0  . lactobacillus acidophilus (BACID) TABS tablet Take 1 tablet by mouth daily.    Marland Kitchen. losartan (COZAAR) 100 MG tablet Take 100 mg by mouth every morning.    . methocarbamol (ROBAXIN) 500 MG tablet Take 1 tablet (500 mg total) by mouth every 6 (six) hours as needed for muscle spasms. 40 tablet 0  . metoprolol tartrate (LOPRESSOR) 25 MG tablet Take 37.5 mg by mouth 2 (two) times daily.    Marland Kitchen. nystatin (MYCOSTATIN/NYSTOP) powder Apply 1 application topically 2 (two) times daily as needed.  5  . polyethylene glycol (MIRALAX / GLYCOLAX) packet Take 17 g by mouth 2 (two) times daily. 14 each 0  . potassium chloride SA (K-DUR,KLOR-CON) 20 MEQ tablet Take 1 tablet (20 mEq total) by  mouth 2 (two) times daily. 180 tablet 3  . spironolactone (ALDACTONE) 25 MG tablet Take 1 tablet (25 mg total) by mouth daily. 90 tablet 3   No current facility-administered medications for this encounter.     Allergies  Allergen Reactions  . Contrast Media [Iodinated Diagnostic Agents] Shortness Of Breath and Palpitations    Broke out in sweats.  . Penicillins Anaphylaxis    Has patient had a PCN reaction causing immediate rash, facial/tongue/throat swelling, SOB or lightheadedness with hypotension: Yes Has patient had a PCN reaction causing severe rash involving mucus membranes or skin necrosis: No Has patient had a PCN reaction that required hospitalization:Yes Has patient had  a PCN reaction occurring within the last 10 years: No If all of the above answers are "NO", then may proceed with Cephalosporin use.   Marland Kitchen Singulair [Montelukast Sodium] Swelling    Lips, mouth, and hand swelling     Social History   Socioeconomic History  . Marital status: Married    Spouse name: Not on file  . Number of children: Not on file  . Years of education: Not on file  . Highest education level: Not on file  Occupational History  . Not on file  Social Needs  . Financial resource strain: Not on file  . Food insecurity:    Worry: Not on file    Inability: Not on file  . Transportation needs:    Medical: Not on file    Non-medical: Not on file  Tobacco Use  . Smoking status: Never Smoker  . Smokeless tobacco: Never Used  Substance and Sexual Activity  . Alcohol use: Yes    Comment: 02/05/2017 "2-4 glasses of wine/month"  . Drug use: No  . Sexual activity: Yes  Lifestyle  . Physical activity:    Days per week: Not on file    Minutes per session: Not on file  . Stress: Not on file  Relationships  . Social connections:    Talks on phone: Not on file    Gets together: Not on file    Attends religious service: Not on file    Active member of club or organization: Not on file     Attends meetings of clubs or organizations: Not on file    Relationship status: Not on file  . Intimate partner violence:    Fear of current or ex partner: Not on file    Emotionally abused: Not on file    Physically abused: Not on file    Forced sexual activity: Not on file  Other Topics Concern  . Not on file  Social History Narrative   Pt lives in [place].  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    Family History  Problem Relation Age of Onset  . Hypertension Mother   . Hyperlipidemia Mother   . Breast cancer Mother   . Uterine cancer Mother   . Sleep apnea Mother   . Hypertension Brother   . Hyperlipidemia Brother   . Cancer Brother     ROS- All systems are reviewed and negative except as per the HPI above  Physical Exam: Vitals:   11/28/17 1540  BP: 108/72  Pulse: 91  Weight: 134.3 kg  Height: 6\' 2"  (1.88 m)   Wt Readings from Last 3 Encounters:  11/28/17 134.3 kg  06/04/17 133.4 kg  05/27/17 133.4 kg    Labs: Lab Results  Component Value Date   NA 135 06/05/2017   K 4.9 06/05/2017   CL 103 06/05/2017   CO2 24 06/05/2017   GLUCOSE 160 (H) 06/05/2017   BUN 15 06/05/2017   CREATININE 1.07 06/05/2017   CALCIUM 8.4 (L) 06/05/2017   MG 1.8 03/29/2017   Lab Results  Component Value Date   INR 1.1 12/31/2016   No results found for: CHOL, HDL, LDLCALC, TRIG   GEN- The patient is well appearing, alert and oriented x 3 today.   Head- normocephalic, atraumatic Eyes-  Sclera clear, conjunctiva pink Ears- hearing intact Oropharynx- clear Neck- supple, no JVP Lymph- no cervical lymphadenopathy Lungs- Clear to ausculation bilaterally, normal work of breathing Heart- irregular rate and rhythm,  no murmurs, rubs or gallops, PMI not laterally displaced GI- soft, NT, ND, + BS Extremities- no clubbing, cyanosis, or edema MS- no significant deformity or atrophy Skin- no rash or lesion Psych- euthymic mood, full affect Neuro- strength  and sensation are intact  EKG-afib at at 91 bpm, qrs int 98 ms, qtc 501 ms Epic records reviewed Echo-Study Conclusions  - Left ventricle: The cavity size was normal. There was mild   concentric hypertrophy. Systolic function was normal. The   estimated ejection fraction was in the range of 60% to 65%. Wall   motion was normal; there were no regional wall motion   abnormalities. - Aortic valve: Transvalvular velocity was within the normal range.   There was no stenosis. There was no regurgitation. - Aorta: Ascending aortic diameter: 38 mm (S). - Ascending aorta: The ascending aorta was mildly dilated. - Mitral valve: Transvalvular velocity was within the normal range.   There was no evidence for stenosis. There was trivial   regurgitation. - Left atrium: The atrium was severely dilated. - Right ventricle: The cavity size was normal. Wall thickness was   normal. Systolic function was normal. - Right atrium: The atrium was severely dilated. - Tricuspid valve: There was trivial regurgitation.   Assessment and Plan: 1. Persistent symptomatic  afib Had been maintaining  SR on dofetilide 500 mcg bid, but with breakthrough afib since yesterday Continue  tikosyn 500 mcg bid Bmet/mag/cbc today Continue CPAP Increase metoprolol 1 1/2 tabs bid Continue eliquis 5 mg bid  for chadsvasc score of 1, no missed doses Will set up for cardioversion next week  2. HTN Well controlled, low readings at home Stop amlodipine with the increase  of metoprolol Continue  spironolactone 25 mg qd, losartan 100 mg qd    Elvina Sidle. [facility] [address] [place], [place] [address] [phone]

## 2017-11-28 NOTE — Patient Instructions (Signed)
Stop Amlodipine  Increase metoprolol to 37.5mg  twice a day (1 and 1/2 tabs of 25mg  tablets)  Cardioversion scheduled for Thursday,August 15th  - [facility] and go to admitting at 1PM  -Do not eat or drink anything after midnight the night prior to your procedure.  - Take all your morning medication with a sip of water prior to arrival.  - You will not be able to drive home after your procedure.

## 2017-11-29 DIAGNOSIS — Z0000 Encounter for general adult medical examination without abnormal findings: Secondary | ICD-10-CM | POA: Diagnosis not present

## 2017-11-29 DIAGNOSIS — I10 Essential (primary) hypertension: Secondary | ICD-10-CM | POA: Diagnosis not present

## 2017-11-29 DIAGNOSIS — L509 Urticaria, unspecified: Secondary | ICD-10-CM | POA: Diagnosis not present

## 2017-11-29 DIAGNOSIS — J45909 Unspecified asthma, uncomplicated: Secondary | ICD-10-CM | POA: Diagnosis not present

## 2017-11-29 DIAGNOSIS — N5201 Erectile dysfunction due to arterial insufficiency: Secondary | ICD-10-CM | POA: Diagnosis not present

## 2017-11-29 DIAGNOSIS — Z125 Encounter for screening for malignant neoplasm of prostate: Secondary | ICD-10-CM | POA: Diagnosis not present

## 2017-12-02 ENCOUNTER — Ambulatory Visit (HOSPITAL_COMMUNITY)
Admission: RE | Admit: 2017-12-02 | Discharge: 2017-12-02 | Disposition: A | Discharge status: Home / Self Care | Payer: BLUE CROSS/BLUE SHIELD | Source: Ambulatory Visit | Attending: Nurse Practitioner | Admitting: Nurse Practitioner

## 2017-12-02 DIAGNOSIS — Z79899 Other long term (current) drug therapy: Secondary | ICD-10-CM | POA: Diagnosis not present

## 2017-12-02 DIAGNOSIS — I4891 Unspecified atrial fibrillation: Secondary | ICD-10-CM | POA: Insufficient documentation

## 2017-12-02 NOTE — Progress Notes (Addendum)
Pt in for repeat EKG before dccv scheduled at the end of the week.  EKG to be reviewed by Rudi Cocoonna Oluwasemilore Bahl, NP  Pt thought he converted to SR after ekg last week showed afib and BB was increased. Ekg shows SR today at 54 bpm.. Bp 118/63. He feels well. Will cancel cardioversion. Continue tiksoyn. F/u in 3 months.

## 2017-12-03 DIAGNOSIS — M25552 Pain in left hip: Secondary | ICD-10-CM | POA: Diagnosis not present

## 2017-12-05 ENCOUNTER — Ambulatory Visit (HOSPITAL_COMMUNITY): Admission: RE | Admit: 2017-12-05 | Payer: BLUE CROSS/BLUE SHIELD | Source: Ambulatory Visit | Admitting: Cardiology

## 2017-12-05 ENCOUNTER — Encounter (HOSPITAL_COMMUNITY): Admission: RE | Payer: Self-pay | Source: Ambulatory Visit

## 2017-12-05 DIAGNOSIS — M25552 Pain in left hip: Secondary | ICD-10-CM | POA: Diagnosis not present

## 2017-12-05 SURGERY — CARDIOVERSION
Anesthesia: General

## 2017-12-10 DIAGNOSIS — M25552 Pain in left hip: Secondary | ICD-10-CM | POA: Diagnosis not present

## 2017-12-12 ENCOUNTER — Ambulatory Visit (HOSPITAL_COMMUNITY): Payer: BLUE CROSS/BLUE SHIELD | Admitting: Nurse Practitioner

## 2017-12-12 DIAGNOSIS — M25552 Pain in left hip: Secondary | ICD-10-CM | POA: Diagnosis not present

## 2017-12-17 DIAGNOSIS — M25552 Pain in left hip: Secondary | ICD-10-CM | POA: Diagnosis not present

## 2017-12-19 DIAGNOSIS — M25552 Pain in left hip: Secondary | ICD-10-CM | POA: Diagnosis not present

## 2017-12-26 DIAGNOSIS — M25552 Pain in left hip: Secondary | ICD-10-CM | POA: Diagnosis not present

## 2018-01-13 DIAGNOSIS — M25552 Pain in left hip: Secondary | ICD-10-CM | POA: Diagnosis not present

## 2018-01-15 DIAGNOSIS — Z96649 Presence of unspecified artificial hip joint: Secondary | ICD-10-CM | POA: Diagnosis not present

## 2018-01-15 DIAGNOSIS — M25552 Pain in left hip: Secondary | ICD-10-CM | POA: Diagnosis not present

## 2018-01-31 ENCOUNTER — Encounter (HOSPITAL_COMMUNITY): Payer: Self-pay | Admitting: *Deleted

## 2018-02-05 ENCOUNTER — Encounter (HOSPITAL_COMMUNITY): Payer: Self-pay | Admitting: Nurse Practitioner

## 2018-02-05 ENCOUNTER — Ambulatory Visit (HOSPITAL_COMMUNITY)
Admission: RE | Admit: 2018-02-05 | Discharge: 2018-02-05 | Disposition: A | Discharge status: Home / Self Care | Payer: BLUE CROSS/BLUE SHIELD | Source: Ambulatory Visit | Attending: Nurse Practitioner | Admitting: Nurse Practitioner

## 2018-02-05 VITALS — BP 116/76 | HR 82 | Ht 74.0 in | Wt 293.0 lb

## 2018-02-05 DIAGNOSIS — Z7901 Long term (current) use of anticoagulants: Secondary | ICD-10-CM | POA: Insufficient documentation

## 2018-02-05 DIAGNOSIS — Z8249 Family history of ischemic heart disease and other diseases of the circulatory system: Secondary | ICD-10-CM | POA: Diagnosis not present

## 2018-02-05 DIAGNOSIS — Z87442 Personal history of urinary calculi: Secondary | ICD-10-CM | POA: Insufficient documentation

## 2018-02-05 DIAGNOSIS — E669 Obesity, unspecified: Secondary | ICD-10-CM | POA: Diagnosis not present

## 2018-02-05 DIAGNOSIS — I4819 Other persistent atrial fibrillation: Secondary | ICD-10-CM | POA: Diagnosis not present

## 2018-02-05 DIAGNOSIS — R001 Bradycardia, unspecified: Secondary | ICD-10-CM | POA: Diagnosis not present

## 2018-02-05 DIAGNOSIS — M1990 Unspecified osteoarthritis, unspecified site: Secondary | ICD-10-CM | POA: Insufficient documentation

## 2018-02-05 DIAGNOSIS — Z7951 Long term (current) use of inhaled steroids: Secondary | ICD-10-CM | POA: Diagnosis not present

## 2018-02-05 DIAGNOSIS — L509 Urticaria, unspecified: Secondary | ICD-10-CM | POA: Insufficient documentation

## 2018-02-05 DIAGNOSIS — I129 Hypertensive chronic kidney disease with stage 1 through stage 4 chronic kidney disease, or unspecified chronic kidney disease: Secondary | ICD-10-CM | POA: Diagnosis not present

## 2018-02-05 DIAGNOSIS — N182 Chronic kidney disease, stage 2 (mild): Secondary | ICD-10-CM | POA: Insufficient documentation

## 2018-02-05 DIAGNOSIS — I480 Paroxysmal atrial fibrillation: Secondary | ICD-10-CM

## 2018-02-05 DIAGNOSIS — G4733 Obstructive sleep apnea (adult) (pediatric): Secondary | ICD-10-CM | POA: Diagnosis not present

## 2018-02-05 DIAGNOSIS — Z79899 Other long term (current) drug therapy: Secondary | ICD-10-CM | POA: Insufficient documentation

## 2018-02-05 DIAGNOSIS — E7800 Pure hypercholesterolemia, unspecified: Secondary | ICD-10-CM | POA: Insufficient documentation

## 2018-02-05 LAB — BASIC METABOLIC PANEL
ANION GAP: 5 (ref 5–15)
BUN: 15 mg/dL (ref 6–20)
CHLORIDE: 108 mmol/L (ref 98–111)
CO2: 26 mmol/L (ref 22–32)
CREATININE: 1.26 mg/dL — AB (ref 0.61–1.24)
Calcium: 9 mg/dL (ref 8.9–10.3)
GFR calc non Af Amer: >60 mL/min (ref >60)
Glucose, Bld: 101 mg/dL — ABNORMAL HIGH (ref 70–99)
Potassium: 4.5 mmol/L (ref 3.5–5.1)
Sodium: 139 mmol/L (ref 135–145)

## 2018-02-05 LAB — TSH: TSH: 2.342 u[IU]/mL (ref 0.350–4.500)

## 2018-02-05 LAB — CBC
HEMATOCRIT: 50.1 % (ref 39.0–52.0)
HEMOGLOBIN: 15.6 g/dL (ref 13.0–17.0)
MCH: 27.8 pg (ref 26.0–34.0)
MCHC: 31.1 g/dL (ref 30.0–36.0)
MCV: 89.3 fL (ref 80.0–100.0)
Platelets: 227 10*3/uL (ref 150–400)
RBC: 5.61 MIL/uL (ref 4.22–5.81)
RDW: 13.4 % (ref 11.5–15.5)
WBC: 6.4 10*3/uL (ref 4.0–10.5)
nRBC: 0 % (ref 0.0–0.2)

## 2018-02-05 LAB — MAGNESIUM: MAGNESIUM: 1.8 mg/dL (ref 1.7–2.4)

## 2018-02-05 NOTE — Patient Instructions (Signed)
Cardioversion scheduled for  - [facility] and go to admitting at  -Do not eat or drink anything after midnight the night prior to your procedure.  - Take all your morning medication with a sip of water prior to arrival.  - You will not be able to drive home after your procedure.  

## 2018-02-05 NOTE — H&P (View-Only) (Signed)
Primary Care Physician: Daisy Floro, MD Referring Physician: [facility] Cardiologist: Dr. Royann Shivers  (pending establishing)   Garrett Hanson is a 57 y.o. male with a h/o HTN, obesity, recently diagnosed sleep apnea, started CPAP 4 weeks ago. He was found to be in rate controlled afib at time of physical mid summer and was sent to Cardiology. He was set up for cardioversion, 9/24,  and unfortunately would not shock out. He came into  the afib clinic for other options.  He reports that 20 years ago he had a couple of episodes of rapid heart rate and possibly Dr. Graciela Husbands, can't for sure remember the name, did cardiac mapping, but no ablation was done and since this was a very infrequent thing, no other treatment was indicated at the time. Now, pt thinks he could have been in afib for a year, as it was not present on his last physical exam. He may have had some intermittent episodes before that. He went to [place] in May and was more short of breath when they were out exploring the area. Echo showed severe enlargement of left and right atrium, 47 mm with volume of 93.  He  was more symptomatic with fatigue, dyspnea, hard to do his usual activities.  F/u in afib clinic 10/26, f/u tikosyn start, he is staying in SR, feels some better, but he has just been in SR x one week and has just been using his CPAP for a few weeks, so he anticipates that he will be feeling better with time. He is very serious that he wants to lose weight and become more active.  F/u in afib clinic 12/7, one month after tikosyn start. He is staying in SR. Bp has improved on spironolactone and K+ levels improved. He feels well. Being complaint with CPAP and Tikosyn, apixaban.  F/u with afib clinic, 8/8. He went back into afib yesterday. He does not  know of any trigger. Continues on Tikosyn and eliquis, no missed doses. He has noted some low BP readings at home. Feels sluggish in afib.  He is back in afib clinic, 10/15.  After visit on 8/8, pt did spontaneously revert to SR and did not require cardioversion. However, he has been out of rhythm in rate controlled afib  X 2 weeks and will pursue cardioversion. He does not know any definite trigger.  Today, he denies symptoms of palpitations, chest pain,   orthopnea, PND, lower extremity edema, dizziness, presyncope, syncope, or neurologic sequela. Positive for fatigue and mild shortness of breath. The patient is tolerating medications without difficulties and is otherwise without complaint today.   Past Medical History:  Diagnosis Date  . Arthritis    "left knee; probably in the shoulders; left hip" (02/05/2017)  . Chronic bronchitis (HCC)    "get it ~ q other year" (02/05/2017)  . Chronic urticaria   . CKD (chronic kidney disease), stage II    pt denies this hx on 02/05/2017  . Complication of anesthesia    pt states awaken during colonscopy   . Dysrhythmia    a fib  . Essential hypertension   . History of blood transfusion 1961  . History of common duodenal ulcer 1980  . History of kidney stones   . Lymphadenopathy    a. evaluated by heme-onc 04/2016, under surveillance.  . OSA on CPAP dx'd ~ 09/2016  . Persistent atrial fibrillation   . Pneumonia    "X2" (02/05/2017)  . Pure hypercholesterolemia  Past Surgical History:  Procedure Laterality Date  . ANTERIOR CERVICAL DECOMP/DISCECTOMY FUSION  04/2005   "used piece from my hip"  . CARDIOVERSION N/A 01/14/2017   Procedure: CARDIOVERSION;  Surgeon: Jake Bathe, MD;  Location: [facility] ENDOSCOPY;  Service: Cardiovascular;  Laterality: N/A;  . CARDIOVERSION N/A 02/07/2017   Procedure: CARDIOVERSION;  Surgeon: Chilton Si, MD;  Location: [facility] ENDOSCOPY;  Service: Cardiovascular;  Laterality: N/A;  . COLONOSCOPY W/ BIOPSIES AND POLYPECTOMY  ~ 2012   "benign"  . ELBOW SURGERY Right    "Tommy John surgery"  . EXTRACORPOREAL SHOCK WAVE LITHOTRIPSY  1990s  . INGUINAL HERNIA REPAIR Right 1980  . JOINT  REPLACEMENT     left knee  . KNEE ARTHROSCOPY Right ~ 1990  . SHOULDER ARTHROSCOPY W/ ROTATOR CUFF REPAIR Right 2000s X 2  . TONSILLECTOMY     "I think I did"  . TOTAL HIP ARTHROPLASTY Left 06/04/2017   Procedure: LEFT TOTAL HIP ARTHROPLASTY ANTERIOR APPROACH;  Surgeon: Durene Romans, MD;  Location: [facility] ORS;  Service: Orthopedics;  Laterality: Left;  . TOTAL KNEE ARTHROPLASTY Left 02/07/2015   Procedure: LEFT TOTAL KNEE ARTHROPLASTY;  Surgeon: Durene Romans, MD;  Location: [facility] ORS;  Service: Orthopedics;  Laterality: Left;    Current Outpatient Medications  Medication Sig Dispense Refill  . atorvastatin (LIPITOR) 40 MG tablet Take 40 mg by mouth every evening.    . dofetilide (TIKOSYN) 500 MCG capsule TAKE 1 CAPSULE BY MOUTH TWICE DAILY 180 capsule 2  . ELIQUIS 5 MG TABS tablet TAKE 1 TABLET TWICE A DAY 180 tablet 2  . famotidine (PEPCID) 40 MG tablet Take 40 mg by mouth 2 (two) times daily.    . fluticasone (VERAMYST) 27.5 MCG/SPRAY nasal spray Place 1 spray into the nose daily as needed for allergies.     . hydrocortisone cream 1 % Apply topically every 6 (six) hours as needed for itching. 30 g 0  . lactobacillus acidophilus (BACID) TABS tablet Take 1 tablet by mouth daily.    Marland Kitchen losartan (COZAAR) 100 MG tablet Take 100 mg by mouth every morning.    . methocarbamol (ROBAXIN) 500 MG tablet Take 1 tablet (500 mg total) by mouth every 6 (six) hours as needed for muscle spasms. 40 tablet 0  . metoprolol tartrate (LOPRESSOR) 25 MG tablet Take 37.5 mg by mouth 2 (two) times daily.    Marland Kitchen nystatin (MYCOSTATIN/NYSTOP) powder Apply 1 application topically 2 (two) times daily as needed.  5  . polyethylene glycol (MIRALAX / GLYCOLAX) packet Take 17 g by mouth 2 (two) times daily. 14 each 0  . potassium chloride SA (K-DUR,KLOR-CON) 20 MEQ tablet Take 1 tablet (20 mEq total) by mouth 2 (two) times daily. 180 tablet 3  . spironolactone (ALDACTONE) 25 MG tablet Take 1 tablet (25 mg total) by mouth daily. 90  tablet 3  . docusate sodium (COLACE) 100 MG capsule Take 1 capsule (100 mg total) by mouth 2 (two) times daily. (Patient not taking: Reported on 02/05/2018) 10 capsule 0  . HYDROcodone-acetaminophen (NORCO) 7.5-325 MG tablet Take 1-2 tablets by mouth every 4 (four) hours as needed for moderate pain. (Patient not taking: Reported on 02/05/2018) 60 tablet 0   No current facility-administered medications for this encounter.     Allergies  Allergen Reactions  . Contrast Media [Iodinated Diagnostic Agents] Shortness Of Breath and Palpitations    Broke out in sweats.  . Penicillins Anaphylaxis    Has patient had a PCN reaction causing immediate rash, facial/tongue/throat  swelling, SOB or lightheadedness with hypotension: Yes Has patient had a PCN reaction causing severe rash involving mucus membranes or skin necrosis: No Has patient had a PCN reaction that required hospitalization:Yes Has patient had a PCN reaction occurring within the last 10 years: No If all of the above answers are "NO", then may proceed with Cephalosporin use.   Marland Kitchen Singulair [Montelukast Sodium] Swelling    Lips, mouth, and hand swelling     Social History   Socioeconomic History  . Marital status: Married    Spouse name: Not on file  . Number of children: Not on file  . Years of education: Not on file  . Highest education level: Not on file  Occupational History  . Not on file  Social Needs  . Financial resource strain: Not on file  . Food insecurity:    Worry: Not on file    Inability: Not on file  . Transportation needs:    Medical: Not on file    Non-medical: Not on file  Tobacco Use  . Smoking status: Never Smoker  . Smokeless tobacco: Never Used  Substance and Sexual Activity  . Alcohol use: Yes    Comment: 02/05/2017 "2-4 glasses of wine/month"  . Drug use: No  . Sexual activity: Yes  Lifestyle  . Physical activity:    Days per week: Not on file    Minutes per session: Not on file  . Stress:  Not on file  Relationships  . Social connections:    Talks on phone: Not on file    Gets together: Not on file    Attends religious service: Not on file    Active member of club or organization: Not on file    Attends meetings of clubs or organizations: Not on file    Relationship status: Not on file  . Intimate partner violence:    Fear of current or ex partner: Not on file    Emotionally abused: Not on file    Physically abused: Not on file    Forced sexual activity: Not on file  Other Topics Concern  . Not on file  Social History Narrative   Pt lives in [place].  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    Family History  Problem Relation Age of Onset  . Hypertension Mother   . Hyperlipidemia Mother   . Breast cancer Mother   . Uterine cancer Mother   . Sleep apnea Mother   . Hypertension Brother   . Hyperlipidemia Brother   . Cancer Brother     ROS- All systems are reviewed and negative except as per the HPI above  Physical Exam: Vitals:   02/05/18 0937  BP: 116/76  Pulse: 82  Weight: 132.9 kg  Height: 6\' 2"  (1.88 m)   Wt Readings from Last 3 Encounters:  02/05/18 132.9 kg  11/28/17 134.3 kg  06/04/17 133.4 kg    Labs: Lab Results  Component Value Date   NA 139 02/05/2018   K 4.5 02/05/2018   CL 108 02/05/2018   CO2 26 02/05/2018   GLUCOSE 101 (H) 02/05/2018   BUN 15 02/05/2018   CREATININE 1.26 (H) 02/05/2018   CALCIUM 9.0 02/05/2018   MG 1.8 02/05/2018   Lab Results  Component Value Date   INR 1.1 12/31/2016   No results found for: CHOL, HDL, LDLCALC, TRIG   GEN- The patient is well appearing, alert and oriented x 3 today.   Head-  normocephalic, atraumatic Eyes-  Sclera clear, conjunctiva pink Ears- hearing intact Oropharynx- clear Neck- supple, no JVP Lymph- no cervical lymphadenopathy Lungs- Clear to ausculation bilaterally, normal work of breathing Heart- irregular rate and rhythm, no murmurs, rubs or gallops,  PMI not laterally displaced GI- soft, NT, ND, + BS Extremities- no clubbing, cyanosis, or edema MS- no significant deformity or atrophy Skin- no rash or lesion Psych- euthymic mood, full affect Neuro- strength and sensation are intact  EKG-afib at at 82 bpm, qrs int 100 ms, qtc 479 ms Epic records reviewed Echo-Study Conclusions  - Left ventricle: The cavity size was normal. There was mild   concentric hypertrophy. Systolic function was normal. The   estimated ejection fraction was in the range of 60% to 65%. Wall   motion was normal; there were no regional wall motion   abnormalities. - Aortic valve: Transvalvular velocity was within the normal range.   There was no stenosis. There was no regurgitation. - Aorta: Ascending aortic diameter: 38 mm (S). - Ascending aorta: The ascending aorta was mildly dilated. - Mitral valve: Transvalvular velocity was within the normal range.   There was no evidence for stenosis. There was trivial   regurgitation. - Left atrium: The atrium was severely dilated. - Right ventricle: The cavity size was normal. Wall thickness was   normal. Systolic function was normal. - Right atrium: The atrium was severely dilated. - Tricuspid valve: There was trivial regurgitation.   Assessment and Plan: 1. Persistent symptomatic  afib x 2 weeks Had been maintaining  SR on dofetilide 500 mcg bid, but has had some issues since August Continue  tikosyn 500 mcg bid Labs today Continue CPAP Continue  metoprolol without change Continue eliquis 5 mg bid  for chadsvasc score of 1, no missed doses Scheduled for cardioversion 10/21 He may need to think about ablation if SR can not be restored long term   2. HTN Well controlled  Continue  spironolactone 25 mg qd, losartan 100 mg qd  F/u in afib clinic one week after cardioverion  Lupita Leash C. Matthew Folks [facility] [address] [place], [place] [address] [phone]

## 2018-02-05 NOTE — Progress Notes (Signed)
 Primary Care Physician: Ross, Charles Alan, MD Referring Physician: [facility] Cardiologist: Dr. Croitoru  (pending establishing)   Garrett Hanson is a 57 y.o. male with a h/o HTN, obesity, recently diagnosed sleep apnea, started CPAP 4 weeks ago. He was found to be in rate controlled afib at time of physical mid summer and was sent to Cardiology. He was set up for cardioversion, 9/24,  and unfortunately would not shock out. He came into  the afib clinic for other options.  He reports that 20 years ago he had a couple of episodes of rapid heart rate and possibly Dr. Klein, can't for sure remember the name, did cardiac mapping, but no ablation was done and since this was a very infrequent thing, no other treatment was indicated at the time. Now, pt thinks he could have been in afib for a year, as it was not present on his last physical exam. He may have had some intermittent episodes before that. He went to [place] in May and was more short of breath when they were out exploring the area. Echo showed severe enlargement of left and right atrium, 47 mm with volume of 93.  He  was more symptomatic with fatigue, dyspnea, hard to do his usual activities.  F/u in afib clinic 10/26, f/u tikosyn start, he is staying in SR, feels some better, but he has just been in SR x one week and has just been using his CPAP for a few weeks, so he anticipates that he will be feeling better with time. He is very serious that he wants to lose weight and become more active.  F/u in afib clinic 12/7, one month after tikosyn start. He is staying in SR. Bp has improved on spironolactone and K+ levels improved. He feels well. Being complaint with CPAP and Tikosyn, apixaban.  F/u with afib clinic, 8/8. He went back into afib yesterday. He does not  know of any trigger. Continues on Tikosyn and eliquis, no missed doses. He has noted some low BP readings at home. Feels sluggish in afib.  He is back in afib clinic, 10/15.  After visit on 8/8, pt did spontaneously revert to SR and did not require cardioversion. However, he has been out of rhythm in rate controlled afib  X 2 weeks and will pursue cardioversion. He does not know any definite trigger.  Today, he denies symptoms of palpitations, chest pain,   orthopnea, PND, lower extremity edema, dizziness, presyncope, syncope, or neurologic sequela. Positive for fatigue and mild shortness of breath. The patient is tolerating medications without difficulties and is otherwise without complaint today.   Past Medical History:  Diagnosis Date  . Arthritis    "left knee; probably in the shoulders; left hip" (02/05/2017)  . Chronic bronchitis (HCC)    "get it ~ q other year" (02/05/2017)  . Chronic urticaria   . CKD (chronic kidney disease), stage II    pt denies this hx on 02/05/2017  . Complication of anesthesia    pt states awaken during colonscopy   . Dysrhythmia    a fib  . Essential hypertension   . History of blood transfusion 1961  . History of common duodenal ulcer 1980  . History of kidney stones   . Lymphadenopathy    a. evaluated by heme-onc 04/2016, under surveillance.  . OSA on CPAP dx'd ~ 09/2016  . Persistent atrial fibrillation   . Pneumonia    "X2" (02/05/2017)  . Pure hypercholesterolemia      Past Surgical History:  Procedure Laterality Date  . ANTERIOR CERVICAL DECOMP/DISCECTOMY FUSION  04/2005   "used piece from my hip"  . CARDIOVERSION N/A 01/14/2017   Procedure: CARDIOVERSION;  Surgeon: Skains, Jevin C, MD;  Location: [facility] ENDOSCOPY;  Service: Cardiovascular;  Laterality: N/A;  . CARDIOVERSION N/A 02/07/2017   Procedure: CARDIOVERSION;  Surgeon: St. George, Tiffany, MD;  Location: [facility] ENDOSCOPY;  Service: Cardiovascular;  Laterality: N/A;  . COLONOSCOPY W/ BIOPSIES AND POLYPECTOMY  ~ 2012   "benign"  . ELBOW SURGERY Right    "Tommy John surgery"  . EXTRACORPOREAL SHOCK WAVE LITHOTRIPSY  1990s  . INGUINAL HERNIA REPAIR Right 1980  . JOINT  REPLACEMENT     left knee  . KNEE ARTHROSCOPY Right ~ 1990  . SHOULDER ARTHROSCOPY W/ ROTATOR CUFF REPAIR Right 2000s X 2  . TONSILLECTOMY     "I think I did"  . TOTAL HIP ARTHROPLASTY Left 06/04/2017   Procedure: LEFT TOTAL HIP ARTHROPLASTY ANTERIOR APPROACH;  Surgeon: Olin, Matthew, MD;  Location: [facility] ORS;  Service: Orthopedics;  Laterality: Left;  . TOTAL KNEE ARTHROPLASTY Left 02/07/2015   Procedure: LEFT TOTAL KNEE ARTHROPLASTY;  Surgeon: Matthew Olin, MD;  Location: [facility] ORS;  Service: Orthopedics;  Laterality: Left;    Current Outpatient Medications  Medication Sig Dispense Refill  . atorvastatin (LIPITOR) 40 MG tablet Take 40 mg by mouth every evening.    . dofetilide (TIKOSYN) 500 MCG capsule TAKE 1 CAPSULE BY MOUTH TWICE DAILY 180 capsule 2  . ELIQUIS 5 MG TABS tablet TAKE 1 TABLET TWICE A DAY 180 tablet 2  . famotidine (PEPCID) 40 MG tablet Take 40 mg by mouth 2 (two) times daily.    . fluticasone (VERAMYST) 27.5 MCG/SPRAY nasal spray Place 1 spray into the nose daily as needed for allergies.     . hydrocortisone cream 1 % Apply topically every 6 (six) hours as needed for itching. 30 g 0  . lactobacillus acidophilus (BACID) TABS tablet Take 1 tablet by mouth daily.    . losartan (COZAAR) 100 MG tablet Take 100 mg by mouth every morning.    . methocarbamol (ROBAXIN) 500 MG tablet Take 1 tablet (500 mg total) by mouth every 6 (six) hours as needed for muscle spasms. 40 tablet 0  . metoprolol tartrate (LOPRESSOR) 25 MG tablet Take 37.5 mg by mouth 2 (two) times daily.    . nystatin (MYCOSTATIN/NYSTOP) powder Apply 1 application topically 2 (two) times daily as needed.  5  . polyethylene glycol (MIRALAX / GLYCOLAX) packet Take 17 g by mouth 2 (two) times daily. 14 each 0  . potassium chloride SA (K-DUR,KLOR-CON) 20 MEQ tablet Take 1 tablet (20 mEq total) by mouth 2 (two) times daily. 180 tablet 3  . spironolactone (ALDACTONE) 25 MG tablet Take 1 tablet (25 mg total) by mouth daily. 90  tablet 3  . docusate sodium (COLACE) 100 MG capsule Take 1 capsule (100 mg total) by mouth 2 (two) times daily. (Patient not taking: Reported on 02/05/2018) 10 capsule 0  . HYDROcodone-acetaminophen (NORCO) 7.5-325 MG tablet Take 1-2 tablets by mouth every 4 (four) hours as needed for moderate pain. (Patient not taking: Reported on 02/05/2018) 60 tablet 0   No current facility-administered medications for this encounter.     Allergies  Allergen Reactions  . Contrast Media [Iodinated Diagnostic Agents] Shortness Of Breath and Palpitations    Broke out in sweats.  . Penicillins Anaphylaxis    Has patient had a PCN reaction causing immediate rash, facial/tongue/throat   swelling, SOB or lightheadedness with hypotension: Yes Has patient had a PCN reaction causing severe rash involving mucus membranes or skin necrosis: No Has patient had a PCN reaction that required hospitalization:Yes Has patient had a PCN reaction occurring within the last 10 years: No If all of the above answers are "NO", then may proceed with Cephalosporin use.   . Singulair [Montelukast Sodium] Swelling    Lips, mouth, and hand swelling     Social History   Socioeconomic History  . Marital status: Married    Spouse name: Not on file  . Number of children: Not on file  . Years of education: Not on file  . Highest education level: Not on file  Occupational History  . Not on file  Social Needs  . Financial resource strain: Not on file  . Food insecurity:    Worry: Not on file    Inability: Not on file  . Transportation needs:    Medical: Not on file    Non-medical: Not on file  Tobacco Use  . Smoking status: Never Smoker  . Smokeless tobacco: Never Used  Substance and Sexual Activity  . Alcohol use: Yes    Comment: 02/05/2017 "2-4 glasses of wine/month"  . Drug use: No  . Sexual activity: Yes  Lifestyle  . Physical activity:    Days per week: Not on file    Minutes per session: Not on file  . Stress:  Not on file  Relationships  . Social connections:    Talks on phone: Not on file    Gets together: Not on file    Attends religious service: Not on file    Active member of club or organization: Not on file    Attends meetings of clubs or organizations: Not on file    Relationship status: Not on file  . Intimate partner violence:    Fear of current or ex partner: Not on file    Emotionally abused: Not on file    Physically abused: Not on file    Forced sexual activity: Not on file  Other Topics Concern  . Not on file  Social History Narrative   Pt lives in [place].  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  Married with 4 children.    Family History  Problem Relation Age of Onset  . Hypertension Mother   . Hyperlipidemia Mother   . Breast cancer Mother   . Uterine cancer Mother   . Sleep apnea Mother   . Hypertension Brother   . Hyperlipidemia Brother   . Cancer Brother     ROS- All systems are reviewed and negative except as per the HPI above  Physical Exam: Vitals:   02/05/18 0937  BP: 116/76  Pulse: 82  Weight: 132.9 kg  Height: 6' 2" (1.88 m)   Wt Readings from Last 3 Encounters:  02/05/18 132.9 kg  11/28/17 134.3 kg  06/04/17 133.4 kg    Labs: Lab Results  Component Value Date   NA 139 02/05/2018   K 4.5 02/05/2018   CL 108 02/05/2018   CO2 26 02/05/2018   GLUCOSE 101 (H) 02/05/2018   BUN 15 02/05/2018   CREATININE 1.26 (H) 02/05/2018   CALCIUM 9.0 02/05/2018   MG 1.8 02/05/2018   Lab Results  Component Value Date   INR 1.1 12/31/2016   No results found for: CHOL, HDL, LDLCALC, TRIG   GEN- The patient is well appearing, alert and oriented x 3 today.   Head-   normocephalic, atraumatic Eyes-  Sclera clear, conjunctiva pink Ears- hearing intact Oropharynx- clear Neck- supple, no JVP Lymph- no cervical lymphadenopathy Lungs- Clear to ausculation bilaterally, normal work of breathing Heart- irregular rate and rhythm, no murmurs, rubs or gallops,  PMI not laterally displaced GI- soft, NT, ND, + BS Extremities- no clubbing, cyanosis, or edema MS- no significant deformity or atrophy Skin- no rash or lesion Psych- euthymic mood, full affect Neuro- strength and sensation are intact  EKG-afib at at 82 bpm, qrs int 100 ms, qtc 479 ms Epic records reviewed Echo-Study Conclusions  - Left ventricle: The cavity size was normal. There was mild   concentric hypertrophy. Systolic function was normal. The   estimated ejection fraction was in the range of 60% to 65%. Wall   motion was normal; there were no regional wall motion   abnormalities. - Aortic valve: Transvalvular velocity was within the normal range.   There was no stenosis. There was no regurgitation. - Aorta: Ascending aortic diameter: 38 mm (S). - Ascending aorta: The ascending aorta was mildly dilated. - Mitral valve: Transvalvular velocity was within the normal range.   There was no evidence for stenosis. There was trivial   regurgitation. - Left atrium: The atrium was severely dilated. - Right ventricle: The cavity size was normal. Wall thickness was   normal. Systolic function was normal. - Right atrium: The atrium was severely dilated. - Tricuspid valve: There was trivial regurgitation.   Assessment and Plan: 1. Persistent symptomatic  afib x 2 weeks Had been maintaining  SR on dofetilide 500 mcg bid, but has had some issues since August Continue  tikosyn 500 mcg bid Labs today Continue CPAP Continue  metoprolol without change Continue eliquis 5 mg bid  for chadsvasc score of 1, no missed doses Scheduled for cardioversion 10/21 He may need to think about ablation if SR can not be restored long term   2. HTN Well controlled  Continue  spironolactone 25 mg qd, losartan 100 mg qd  F/u in afib clinic one week after cardioverion  Moosa Bueche C. Jeet Shough, ANP-C [facility] [address] [place], [place] [address] [phone]    

## 2018-02-10 ENCOUNTER — Ambulatory Visit (HOSPITAL_COMMUNITY): Payer: BLUE CROSS/BLUE SHIELD | Admitting: Certified Registered"

## 2018-02-10 ENCOUNTER — Ambulatory Visit (HOSPITAL_COMMUNITY)
Admission: RE | Admit: 2018-02-10 | Discharge: 2018-02-10 | Disposition: A | Discharge status: Home / Self Care | Payer: BLUE CROSS/BLUE SHIELD | Source: Ambulatory Visit | Attending: Cardiovascular Disease | Admitting: Cardiovascular Disease

## 2018-02-10 ENCOUNTER — Other Ambulatory Visit: Payer: Self-pay | Admitting: Internal Medicine

## 2018-02-10 ENCOUNTER — Encounter (HOSPITAL_COMMUNITY): Payer: Self-pay | Admitting: *Deleted

## 2018-02-10 ENCOUNTER — Encounter (HOSPITAL_COMMUNITY)
Admission: RE | Disposition: A | Discharge status: Home / Self Care | Payer: Self-pay | Source: Ambulatory Visit | Attending: Cardiovascular Disease

## 2018-02-10 DIAGNOSIS — Z981 Arthrodesis status: Secondary | ICD-10-CM | POA: Insufficient documentation

## 2018-02-10 DIAGNOSIS — Z87442 Personal history of urinary calculi: Secondary | ICD-10-CM | POA: Insufficient documentation

## 2018-02-10 DIAGNOSIS — Z96652 Presence of left artificial knee joint: Secondary | ICD-10-CM | POA: Diagnosis not present

## 2018-02-10 DIAGNOSIS — Z880 Allergy status to penicillin: Secondary | ICD-10-CM | POA: Insufficient documentation

## 2018-02-10 DIAGNOSIS — I129 Hypertensive chronic kidney disease with stage 1 through stage 4 chronic kidney disease, or unspecified chronic kidney disease: Secondary | ICD-10-CM | POA: Diagnosis not present

## 2018-02-10 DIAGNOSIS — Z888 Allergy status to other drugs, medicaments and biological substances status: Secondary | ICD-10-CM | POA: Diagnosis not present

## 2018-02-10 DIAGNOSIS — Z9889 Other specified postprocedural states: Secondary | ICD-10-CM | POA: Insufficient documentation

## 2018-02-10 DIAGNOSIS — Z91041 Radiographic dye allergy status: Secondary | ICD-10-CM | POA: Diagnosis not present

## 2018-02-10 DIAGNOSIS — Z79899 Other long term (current) drug therapy: Secondary | ICD-10-CM | POA: Diagnosis not present

## 2018-02-10 DIAGNOSIS — I4819 Other persistent atrial fibrillation: Secondary | ICD-10-CM | POA: Diagnosis not present

## 2018-02-10 DIAGNOSIS — N182 Chronic kidney disease, stage 2 (mild): Secondary | ICD-10-CM | POA: Insufficient documentation

## 2018-02-10 DIAGNOSIS — I4891 Unspecified atrial fibrillation: Secondary | ICD-10-CM | POA: Diagnosis not present

## 2018-02-10 DIAGNOSIS — Z7901 Long term (current) use of anticoagulants: Secondary | ICD-10-CM | POA: Diagnosis not present

## 2018-02-10 DIAGNOSIS — Z8249 Family history of ischemic heart disease and other diseases of the circulatory system: Secondary | ICD-10-CM | POA: Insufficient documentation

## 2018-02-10 DIAGNOSIS — E7800 Pure hypercholesterolemia, unspecified: Secondary | ICD-10-CM | POA: Insufficient documentation

## 2018-02-10 DIAGNOSIS — E669 Obesity, unspecified: Secondary | ICD-10-CM | POA: Diagnosis not present

## 2018-02-10 DIAGNOSIS — G4733 Obstructive sleep apnea (adult) (pediatric): Secondary | ICD-10-CM | POA: Insufficient documentation

## 2018-02-10 DIAGNOSIS — Z96642 Presence of left artificial hip joint: Secondary | ICD-10-CM | POA: Insufficient documentation

## 2018-02-10 DIAGNOSIS — M1990 Unspecified osteoarthritis, unspecified site: Secondary | ICD-10-CM | POA: Insufficient documentation

## 2018-02-10 DIAGNOSIS — N183 Chronic kidney disease, stage 3 (moderate): Secondary | ICD-10-CM | POA: Diagnosis not present

## 2018-02-10 DIAGNOSIS — Z6837 Body mass index (BMI) 37.0-37.9, adult: Secondary | ICD-10-CM | POA: Insufficient documentation

## 2018-02-10 DIAGNOSIS — R001 Bradycardia, unspecified: Secondary | ICD-10-CM | POA: Diagnosis not present

## 2018-02-10 HISTORY — PX: CARDIOVERSION: SHX1299

## 2018-02-10 SURGERY — CARDIOVERSION
Anesthesia: General

## 2018-02-10 MED ORDER — LIDOCAINE 2% (20 MG/ML) 5 ML SYRINGE
INTRAMUSCULAR | Status: DC | PRN
  Administered 2018-02-10: 60 mg via INTRAVENOUS

## 2018-02-10 MED ORDER — SODIUM CHLORIDE 0.9 % IV SOLN
INTRAVENOUS | Status: DC
  Administered 2018-02-10: 500 mL via INTRAVENOUS

## 2018-02-10 MED ORDER — PROPOFOL 10 MG/ML IV BOLUS
INTRAVENOUS | Status: DC | PRN
  Administered 2018-02-10: 100 mg via INTRAVENOUS

## 2018-02-10 NOTE — Interval H&P Note (Signed)
History and Physical Interval Note:  02/10/2018 8:11 AM  Garrett Hanson  has presented today for surgery, with the diagnosis of a fib  The various methods of treatment have been discussed with the patient and family. After consideration of risks, benefits and other options for treatment, the patient has consented to  Procedure(s): CARDIOVERSION (N/A) as a surgical intervention .  The patient's history has been reviewed, patient examined, no change in status, stable for surgery.  I have reviewed the patient's chart and labs.  Questions were answered to the patient's satisfaction.     [name]

## 2018-02-10 NOTE — Op Note (Signed)
Procedure: Electrical Cardioversion Indications:  Atrial Fibrillation  Procedure Details:  Consent: Risks of procedure as well as the alternatives and risks of each were explained to the (patient/caregiver).  Consent for procedure obtained.  Time Out: Verified patient identification, verified procedure, site/side was marked, verified correct patient position, special equipment/implants available, medications/allergies/relevent history reviewed, required imaging and test results available.  Performed  Patient placed on cardiac monitor, pulse oximetry, supplemental oxygen as necessary.  Sedation given: propofol 100 mg IV, Dr. Chaney Malling Pacer pads placed anterior and posterior chest.  Cardioverted 1 time(s).  Cardioversion with synchronized biphasic 120J shock.  Evaluation: Findings: Post procedure EKG shows: NSR Complications: None Patient did tolerate procedure well.  Time Spent Directly with the Patient:  30 minutes   Garrett Hanson 02/10/2018, 8:34 AM

## 2018-02-10 NOTE — Anesthesia Postprocedure Evaluation (Signed)
Anesthesia Post Note  Patient: Garrett Hanson Else  Procedure(s) Performed: CARDIOVERSION (N/A )     Patient location during evaluation: Endoscopy Anesthesia Type: General Level of consciousness: awake and alert Pain management: pain level controlled Vital Signs Assessment: post-procedure vital signs reviewed and stable Respiratory status: spontaneous breathing, nonlabored ventilation, respiratory function stable and patient connected to nasal cannula oxygen Cardiovascular status: blood pressure returned to baseline and stable Postop Assessment: no apparent nausea or vomiting Anesthetic complications: no    Last Vitals:  Vitals:   02/10/18 0851 02/10/18 0901  BP: 109/79 116/78  Pulse: (!) 57 (!) 58  Resp: 16 12  Temp:    SpO2: 96% 96%    Last Pain:  Vitals:   02/10/18 0901  TempSrc:   PainSc: 0-No pain                 Dahir Ayer S

## 2018-02-10 NOTE — Transfer of Care (Signed)
Immediate Anesthesia Transfer of Care Note  Patient: Garrett Hanson  Procedure(s) Performed: CARDIOVERSION (N/A )  Patient Location: Endoscopy Unit  Anesthesia Type:General  Level of Consciousness: drowsy  Airway & Oxygen Therapy: Patient Spontanous Breathing  Post-op Assessment: Report given to RN  Post vital signs: Reviewed and stable  Last Vitals:  Vitals Value Taken Time  BP    Temp    Pulse    Resp    SpO2      Last Pain:  Vitals:   02/10/18 0756  TempSrc: Oral         Complications: No apparent anesthesia complications

## 2018-02-10 NOTE — Anesthesia Preprocedure Evaluation (Addendum)
Anesthesia Evaluation  Patient identified by MRN, date of birth, ID band Patient awake    Reviewed: Allergy & Precautions, H&P , NPO status , Patient's Chart, lab work & pertinent test results  Airway Mallampati: II  TM Distance: >3 FB Neck ROM: full    Dental  (+) Teeth Intact, Dental Advisory Given   Pulmonary sleep apnea , pneumonia,    breath sounds clear to auscultation       Cardiovascular hypertension, + dysrhythmias Atrial Fibrillation  Rhythm:irregular Rate:Normal     Neuro/Psych    GI/Hepatic   Endo/Other    Renal/GU      Musculoskeletal  (+) Arthritis ,   Abdominal   Peds  Hematology   Anesthesia Other Findings   Reproductive/Obstetrics                           Anesthesia Physical Anesthesia Plan  ASA: III  Anesthesia Plan: General   Post-op Pain Management:    Induction: Intravenous  PONV Risk Score and Plan: 2 and Propofol infusion and Treatment may vary due to age or medical condition  Airway Management Planned: Mask  Additional Equipment:   Intra-op Plan:   Post-operative Plan:   Informed Consent: I have reviewed the patients History and Physical, chart, labs and discussed the procedure including the risks, benefits and alternatives for the proposed anesthesia with the patient or authorized representative who has indicated his/her understanding and acceptance.     Plan Discussed with: CRNA, Anesthesiologist and Surgeon  Anesthesia Plan Comments:         Anesthesia Quick Evaluation

## 2018-02-10 NOTE — Discharge Instructions (Signed)
Electrical Cardioversion, Care After °This sheet gives you information about how to care for yourself after your procedure. Your health care provider may also give you more specific instructions. If you have problems or questions, contact your health care provider. °What can I expect after the procedure? °After the procedure, it is common to have: °· Some redness on the skin where the shocks were given. ° °Follow these instructions at home: °· Do not drive for 24 hours if you were given a medicine to help you relax (sedative). °· Take over-the-counter and prescription medicines only as told by your health care provider. °· Ask your health care provider how to check your pulse. Check it often. °· Rest for 48 hours after the procedure or as told by your health care provider. °· Avoid or limit your caffeine use as told by your health care provider. °Contact a health care provider if: °· You feel like your heart is beating too quickly or your pulse is not regular. °· You have a serious muscle cramp that does not go away. °Get help right away if: °· You have discomfort in your chest. °· You are dizzy or you feel faint. °· You have trouble breathing or you are short of breath. °· Your speech is slurred. °· You have trouble moving an arm or leg on one side of your body. °· Your fingers or toes turn cold or blue. °This information is not intended to replace advice given to you by your health care provider. Make sure you discuss any questions you have with your health care provider. °Document Released: 01/28/2013 Document Revised: 11/11/2015 Document Reviewed: 10/14/2015 °Elsevier Interactive Patient Education © 2018 [employer] ° °

## 2018-02-11 ENCOUNTER — Encounter (HOSPITAL_COMMUNITY): Payer: Self-pay | Admitting: Cardiovascular Disease

## 2018-02-12 NOTE — Telephone Encounter (Signed)
This is a A-Fib clinic pt. Donna Carroll, NP prescribed this medication. Please address °

## 2018-02-19 DIAGNOSIS — Z96653 Presence of artificial knee joint, bilateral: Secondary | ICD-10-CM | POA: Diagnosis not present

## 2018-02-19 DIAGNOSIS — Z471 Aftercare following joint replacement surgery: Secondary | ICD-10-CM | POA: Diagnosis not present

## 2018-02-19 DIAGNOSIS — Z96643 Presence of artificial hip joint, bilateral: Secondary | ICD-10-CM | POA: Diagnosis not present

## 2018-02-19 DIAGNOSIS — M1612 Unilateral primary osteoarthritis, left hip: Secondary | ICD-10-CM | POA: Diagnosis not present

## 2018-02-24 ENCOUNTER — Ambulatory Visit (HOSPITAL_COMMUNITY)
Admission: RE | Admit: 2018-02-24 | Discharge: 2018-02-24 | Disposition: A | Discharge status: Home / Self Care | Payer: BLUE CROSS/BLUE SHIELD | Source: Ambulatory Visit | Attending: Nurse Practitioner | Admitting: Nurse Practitioner

## 2018-02-24 VITALS — BP 142/80 | HR 53 | Ht 74.0 in | Wt 293.0 lb

## 2018-02-24 DIAGNOSIS — I129 Hypertensive chronic kidney disease with stage 1 through stage 4 chronic kidney disease, or unspecified chronic kidney disease: Secondary | ICD-10-CM | POA: Insufficient documentation

## 2018-02-24 DIAGNOSIS — G4733 Obstructive sleep apnea (adult) (pediatric): Secondary | ICD-10-CM | POA: Insufficient documentation

## 2018-02-24 DIAGNOSIS — Z79899 Other long term (current) drug therapy: Secondary | ICD-10-CM | POA: Diagnosis not present

## 2018-02-24 DIAGNOSIS — I480 Paroxysmal atrial fibrillation: Secondary | ICD-10-CM

## 2018-02-24 DIAGNOSIS — N182 Chronic kidney disease, stage 2 (mild): Secondary | ICD-10-CM | POA: Diagnosis not present

## 2018-02-24 DIAGNOSIS — Z7901 Long term (current) use of anticoagulants: Secondary | ICD-10-CM | POA: Diagnosis not present

## 2018-02-24 DIAGNOSIS — E7800 Pure hypercholesterolemia, unspecified: Secondary | ICD-10-CM | POA: Insufficient documentation

## 2018-02-24 DIAGNOSIS — Z880 Allergy status to penicillin: Secondary | ICD-10-CM | POA: Diagnosis not present

## 2018-02-24 DIAGNOSIS — I4819 Other persistent atrial fibrillation: Secondary | ICD-10-CM | POA: Insufficient documentation

## 2018-02-24 NOTE — Progress Notes (Signed)
Primary Care Physician: Daisy Floro, MD Referring Physician: [facility] Cardiologist: Dr. Royann Shivers  (pending establishing)   Garrett Hanson is a 57 y.o. male with a h/o HTN, obesity, recently diagnosed sleep apnea, started CPAP 4 weeks ago. He was found to be in rate controlled afib at time of physical mid summer and was sent to Cardiology. He was set up for cardioversion, 9/24,  and unfortunately would not shock out. He came into  the afib clinic for other options.  He reports that 20 years ago he had a couple of episodes of rapid heart rate and possibly Dr. Graciela Husbands, can't for sure remember the name, did cardiac mapping, but no ablation was done and since this was a very infrequent thing, no other treatment was indicated at the time. Now, pt thinks he could have been in afib for a year, as it was not present on his last physical exam. He may have had some intermittent episodes before that. He went to [place] in May and was more short of breath when they were out exploring the area. Echo showed severe enlargement of left and right atrium, 47 mm with volume of 93.  He  was more symptomatic with fatigue, dyspnea, hard to do his usual activities.  F/u in afib clinic 10/26, f/u tikosyn start, he is staying in SR, feels some better, but he has just been in SR x one week and has just been using his CPAP for a few weeks, so he anticipates that he will be feeling better with time. He is very serious that he wants to lose weight and become more active.  F/u in afib clinic 12/7, one month after tikosyn start. He is staying in SR. Bp has improved on spironolactone and K+ levels improved. He feels well. Being complaint with CPAP and Tikosyn, apixaban.  F/u with afib clinic, 8/8. He went back into afib yesterday. He does not  know of any trigger. Continues on Tikosyn and eliquis, no missed doses. He has noted some low BP readings at home. Feels sluggish in afib.  He is back in afib clinic, 10/15.  After visit on 8/8, pt did spontaneously revert to SR and did not require cardioversion. However, he has been out of rhythm in rate controlled afib  X 2 weeks and will pursue cardioversion. He does not know any definite trigger.  F/u in clinic after cardioversion 11/4 after successful cardioversion.He has not noted any further afib.   Today, he denies symptoms of palpitations, chest pain,   orthopnea, PND, lower extremity edema, dizziness, presyncope, syncope, or neurologic sequela.  The patient is tolerating medications without difficulties and is otherwise without complaint today.   Past Medical History:  Diagnosis Date  . Arthritis    "left knee; probably in the shoulders; left hip" (02/05/2017)  . Chronic bronchitis (HCC)    "get it ~ q other year" (02/05/2017)  . Chronic urticaria   . CKD (chronic kidney disease), stage II    pt denies this hx on 02/05/2017  . Complication of anesthesia    pt states awaken during colonscopy   . Dysrhythmia    a fib  . Essential hypertension   . History of blood transfusion 1961  . History of common duodenal ulcer 1980  . History of kidney stones   . Lymphadenopathy    a. evaluated by heme-onc 04/2016, under surveillance.  . OSA on CPAP dx'd ~ 09/2016  . Persistent atrial fibrillation   . Pneumonia    "  X2" (02/05/2017)  . Pure hypercholesterolemia    Past Surgical History:  Procedure Laterality Date  . ANTERIOR CERVICAL DECOMP/DISCECTOMY FUSION  04/2005   "used piece from my hip"  . CARDIOVERSION N/A 01/14/2017   Procedure: CARDIOVERSION;  Surgeon: Jake Bathe, MD;  Location: [facility] ENDOSCOPY;  Service: Cardiovascular;  Laterality: N/A;  . CARDIOVERSION N/A 02/07/2017   Procedure: CARDIOVERSION;  Surgeon: Chilton Si, MD;  Location: [facility] ENDOSCOPY;  Service: Cardiovascular;  Laterality: N/A;  . CARDIOVERSION N/A 02/10/2018   Procedure: CARDIOVERSION;  Surgeon: Thurmon Fair, MD;  Location: [facility] ENDOSCOPY;  Service: Cardiovascular;   Laterality: N/A;  . COLONOSCOPY W/ BIOPSIES AND POLYPECTOMY  ~ 2012   "benign"  . ELBOW SURGERY Right    "Tommy John surgery"  . EXTRACORPOREAL SHOCK WAVE LITHOTRIPSY  1990s  . INGUINAL HERNIA REPAIR Right 1980  . JOINT REPLACEMENT     left knee  . KNEE ARTHROSCOPY Right ~ 1990  . SHOULDER ARTHROSCOPY W/ ROTATOR CUFF REPAIR Right 2000s X 2  . TONSILLECTOMY     "I think I did"  . TOTAL HIP ARTHROPLASTY Left 06/04/2017   Procedure: LEFT TOTAL HIP ARTHROPLASTY ANTERIOR APPROACH;  Surgeon: Durene Romans, MD;  Location: [facility] ORS;  Service: Orthopedics;  Laterality: Left;  . TOTAL KNEE ARTHROPLASTY Left 02/07/2015   Procedure: LEFT TOTAL KNEE ARTHROPLASTY;  Surgeon: Durene Romans, MD;  Location: [facility] ORS;  Service: Orthopedics;  Laterality: Left;    Current Outpatient Medications  Medication Sig Dispense Refill  . albuterol (PROVENTIL HFA;VENTOLIN HFA) 108 (90 Base) MCG/ACT inhaler Inhale 1-2 puffs into the lungs every 6 (six) hours as needed for shortness of breath or wheezing.    Marland Kitchen atorvastatin (LIPITOR) 40 MG tablet Take 40 mg by mouth every evening.    . dofetilide (TIKOSYN) 500 MCG capsule TAKE 1 CAPSULE BY MOUTH TWICE DAILY (Patient taking differently: Take 500 mcg by mouth 2 (two) times daily. ) 180 capsule 2  . ELIQUIS 5 MG TABS tablet TAKE 1 TABLET TWICE A DAY (Patient taking differently: Take 5 mg by mouth 2 (two) times daily. ) 180 tablet 2  . EPIPEN 2-PAK 0.3 MG/0.3ML SOAJ injection Inject 0.3 mg into the muscle daily as needed for anaphylaxis.  3  . famotidine (PEPCID) 40 MG tablet Take 40 mg by mouth 2 (two) times daily.    . fluticasone (FLONASE) 50 MCG/ACT nasal spray Place 1 spray into both nostrils daily as needed for allergies.    Marland Kitchen losartan (COZAAR) 100 MG tablet Take 100 mg by mouth daily.     . metoprolol tartrate (LOPRESSOR) 25 MG tablet Take 37.5 mg by mouth 2 (two) times daily.     . potassium chloride SA (K-DUR,KLOR-CON) 20 MEQ tablet Take 1 tablet (20 mEq total) by mouth  2 (two) times daily. 180 tablet 3  . spironolactone (ALDACTONE) 25 MG tablet Take 1 tablet (25 mg total) by mouth daily. 90 tablet 3  . tadalafil (ADCIRCA/CIALIS) 20 MG tablet Take 40-100 mg by mouth daily as needed for erectile dysfunction.    . methocarbamol (ROBAXIN) 500 MG tablet Take 1 tablet (500 mg total) by mouth every 6 (six) hours as needed for muscle spasms. (Patient not taking: Reported on 02/24/2018) 40 tablet 0   No current facility-administered medications for this encounter.     Allergies  Allergen Reactions  . Contrast Media [Iodinated Diagnostic Agents] Shortness Of Breath and Palpitations    Broke out in sweats.  . Penicillins Anaphylaxis    Has patient had  a PCN reaction causing immediate rash, facial/tongue/throat swelling, SOB or lightheadedness with hypotension: Yes Has patient had a PCN reaction causing severe rash involving mucus membranes or skin necrosis: No Has patient had a PCN reaction that required hospitalization:Yes Has patient had a PCN reaction occurring within the last 10 years: No If all of the above answers are "NO", then may proceed with Cephalosporin use.   Marland Kitchen Singulair [Montelukast Sodium] Swelling    Lips, mouth, and hand swelling     Social History   Socioeconomic History  . Marital status: Married    Spouse name: Not on file  . Number of children: Not on file  . Years of education: Not on file  . Highest education level: Not on file  Occupational History  . Not on file  Social Needs  . Financial resource strain: Not on file  . Food insecurity:    Worry: Not on file    Inability: Not on file  . Transportation needs:    Medical: Not on file    Non-medical: Not on file  Tobacco Use  . Smoking status: Never Smoker  . Smokeless tobacco: Never Used  Substance and Sexual Activity  . Alcohol use: Yes    Comment: 02/05/2017 "2-4 glasses of wine/month"  . Drug use: No  . Sexual activity: Yes  Lifestyle  . Physical activity:    Days  per week: Not on file    Minutes per session: Not on file  . Stress: Not on file  Relationships  . Social connections:    Talks on phone: Not on file    Gets together: Not on file    Attends religious service: Not on file    Active member of club or organization: Not on file    Attends meetings of clubs or organizations: Not on file    Relationship status: Not on file  . Intimate partner violence:    Fear of current or ex partner: Not on file    Emotionally abused: Not on file    Physically abused: Not on file    Forced sexual activity: Not on file  Other Topics Concern  . Not on file  Social History Narrative   Pt lives in [place].  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    Family History  Problem Relation Age of Onset  . Hypertension Mother   . Hyperlipidemia Mother   . Breast cancer Mother   . Uterine cancer Mother   . Sleep apnea Mother   . Hypertension Brother   . Hyperlipidemia Brother   . Cancer Brother     ROS- All systems are reviewed and negative except as per the HPI above  Physical Exam: Vitals:   02/24/18 1547  BP: (!) 142/80  Pulse: (!) 53  Weight: 132.9 kg  Height: 6\' 2"  (1.88 m)   Wt Readings from Last 3 Encounters:  02/24/18 132.9 kg  02/05/18 132.9 kg  11/28/17 134.3 kg    Labs: Lab Results  Component Value Date   NA 139 02/05/2018   K 4.5 02/05/2018   CL 108 02/05/2018   CO2 26 02/05/2018   GLUCOSE 101 (H) 02/05/2018   BUN 15 02/05/2018   CREATININE 1.26 (H) 02/05/2018   CALCIUM 9.0 02/05/2018   MG 1.8 02/05/2018   Lab Results  Component Value Date   INR 1.1 12/31/2016   No results found for: CHOL, HDL, LDLCALC, TRIG   GEN- The patient is well appearing,  alert and oriented x 3 today.   Head- normocephalic, atraumatic Eyes-  Sclera clear, conjunctiva pink Ears- hearing intact Oropharynx- clear Neck- supple, no JVP Lymph- no cervical lymphadenopathy Lungs- Clear to ausculation bilaterally, normal  work of breathing Heart- regular rate and rhythm, no murmurs, rubs or gallops, PMI not laterally displaced GI- soft, NT, ND, + BS Extremities- no clubbing, cyanosis, or edema MS- no significant deformity or atrophy Skin- no rash or lesion Psych- euthymic mood, full affect Neuro- strength and sensation are intact  EKG- Sinus brady at 53 bpm, pr int 164 ms, qrs int 100 ms, qtc 465 ms Epic records reviewed Echo-Study Conclusions  - Left ventricle: The cavity size was normal. There was mild   concentric hypertrophy. Systolic function was normal. The   estimated ejection fraction was in the range of 60% to 65%. Wall   motion was normal; there were no regional wall motion   abnormalities. - Aortic valve: Transvalvular velocity was within the normal range.   There was no stenosis. There was no regurgitation. - Aorta: Ascending aortic diameter: 38 mm (S). - Ascending aorta: The ascending aorta was mildly dilated. - Mitral valve: Transvalvular velocity was within the normal range.   There was no evidence for stenosis. There was trivial   regurgitation. - Left atrium: The atrium was severely dilated. - Right ventricle: The cavity size was normal. Wall thickness was   normal. Systolic function was normal. - Right atrium: The atrium was severely dilated. - Tricuspid valve: There was trivial regurgitation.   Assessment and Plan: 1. Persistent symptomatic afib   Had been maintaining  SR on dofetilide 500 mcg bid, but has had some afaib issues since August Had breakthrough afib fort 2 weeks Had recent successful cardioversion  Continue  tikosyn 500 mcg bid Continue CPAP Continue  metoprolol without change at 37.5 mg bid Continue eliquis 5 mg bid  for chadsvasc score of 1, no missed doses He may need to think about ablation if SR can not be restored long term   2. HTN Well controlled  Continue  spironolactone 25 mg qd, losartan 100 mg qd  F/u in afib clinic in 3 months  Lupita Leash C.  [facility] [address] [place], [place] [address] [phone]

## 2018-03-04 DIAGNOSIS — J209 Acute bronchitis, unspecified: Secondary | ICD-10-CM | POA: Diagnosis not present

## 2018-03-06 ENCOUNTER — Ambulatory Visit (HOSPITAL_COMMUNITY): Payer: BLUE CROSS/BLUE SHIELD | Admitting: Nurse Practitioner

## 2018-03-14 DIAGNOSIS — G4733 Obstructive sleep apnea (adult) (pediatric): Secondary | ICD-10-CM | POA: Diagnosis not present

## 2018-03-14 DIAGNOSIS — R269 Unspecified abnormalities of gait and mobility: Secondary | ICD-10-CM | POA: Diagnosis not present

## 2018-03-21 ENCOUNTER — Other Ambulatory Visit (HOSPITAL_COMMUNITY): Payer: Self-pay | Admitting: Nurse Practitioner

## 2018-03-23 DIAGNOSIS — M25552 Pain in left hip: Secondary | ICD-10-CM | POA: Diagnosis not present

## 2018-04-02 DIAGNOSIS — M25552 Pain in left hip: Secondary | ICD-10-CM | POA: Diagnosis not present

## 2018-04-02 DIAGNOSIS — M7062 Trochanteric bursitis, left hip: Secondary | ICD-10-CM | POA: Diagnosis not present

## 2018-05-23 ENCOUNTER — Other Ambulatory Visit (HOSPITAL_COMMUNITY): Payer: Self-pay | Admitting: Nurse Practitioner

## 2018-05-28 DIAGNOSIS — M7062 Trochanteric bursitis, left hip: Secondary | ICD-10-CM | POA: Insufficient documentation

## 2018-05-30 ENCOUNTER — Encounter (HOSPITAL_COMMUNITY): Payer: Self-pay | Admitting: Nurse Practitioner

## 2018-05-30 ENCOUNTER — Ambulatory Visit (HOSPITAL_COMMUNITY)
Admission: RE | Admit: 2018-05-30 | Discharge: 2018-05-30 | Disposition: A | Discharge status: Home / Self Care | Payer: Managed Care, Other (non HMO) | Source: Ambulatory Visit | Attending: Nurse Practitioner | Admitting: Nurse Practitioner

## 2018-05-30 VITALS — BP 124/80 | HR 61 | Ht 74.0 in | Wt 297.0 lb

## 2018-05-30 DIAGNOSIS — G4733 Obstructive sleep apnea (adult) (pediatric): Secondary | ICD-10-CM | POA: Insufficient documentation

## 2018-05-30 DIAGNOSIS — M1712 Unilateral primary osteoarthritis, left knee: Secondary | ICD-10-CM | POA: Insufficient documentation

## 2018-05-30 DIAGNOSIS — I4819 Other persistent atrial fibrillation: Secondary | ICD-10-CM | POA: Diagnosis not present

## 2018-05-30 DIAGNOSIS — E7800 Pure hypercholesterolemia, unspecified: Secondary | ICD-10-CM | POA: Insufficient documentation

## 2018-05-30 DIAGNOSIS — G4730 Sleep apnea, unspecified: Secondary | ICD-10-CM | POA: Diagnosis not present

## 2018-05-30 DIAGNOSIS — Z7901 Long term (current) use of anticoagulants: Secondary | ICD-10-CM | POA: Insufficient documentation

## 2018-05-30 DIAGNOSIS — M1612 Unilateral primary osteoarthritis, left hip: Secondary | ICD-10-CM | POA: Insufficient documentation

## 2018-05-30 DIAGNOSIS — Z8249 Family history of ischemic heart disease and other diseases of the circulatory system: Secondary | ICD-10-CM | POA: Diagnosis not present

## 2018-05-30 DIAGNOSIS — Z79899 Other long term (current) drug therapy: Secondary | ICD-10-CM | POA: Diagnosis not present

## 2018-05-30 DIAGNOSIS — I480 Paroxysmal atrial fibrillation: Secondary | ICD-10-CM | POA: Diagnosis not present

## 2018-05-30 DIAGNOSIS — J42 Unspecified chronic bronchitis: Secondary | ICD-10-CM | POA: Insufficient documentation

## 2018-05-30 DIAGNOSIS — I10 Essential (primary) hypertension: Secondary | ICD-10-CM | POA: Diagnosis not present

## 2018-05-30 LAB — BASIC METABOLIC PANEL
Anion gap: 10 (ref 5–15)
BUN: 14 mg/dL (ref 6–20)
CALCIUM: 9.2 mg/dL (ref 8.9–10.3)
CO2: 23 mmol/L (ref 22–32)
CREATININE: 1.12 mg/dL (ref 0.61–1.24)
Chloride: 107 mmol/L (ref 98–111)
GFR calc Af Amer: >60 mL/min (ref >60)
GFR calc non Af Amer: >60 mL/min (ref >60)
GLUCOSE: 101 mg/dL — AB (ref 70–99)
Potassium: 4 mmol/L (ref 3.5–5.1)
Sodium: 140 mmol/L (ref 135–145)

## 2018-05-30 LAB — MAGNESIUM: Magnesium: 2 mg/dL (ref 1.7–2.4)

## 2018-05-30 NOTE — Progress Notes (Signed)
Primary Care Physician: Daisy Floro, MD Referring Physician: [facility] Care Cardiologist: Garrett Hanson is a 58 y.o. male with a h/o HTN, obesity, recently diagnosed sleep apnea, started CPAP 4 weeks ago. He was found to be in rate controlled afib at time of physical mid summer and was sent to Cardiology. He was set up for cardioversion, 9/24,  and unfortunately would not shock out. He came into  the afib clinic for other options.  He reports that 20 years ago he had a couple of episodes of rapid heart rate and possibly Dr. Graciela Husbands, can't for sure remember the name, did cardiac mapping, but no ablation was done and since this was a very infrequent thing, no other treatment was indicated at the time. Now, pt thinks he could have been in afib for a year, as it was not present on his last physical exam. He may have had some intermittent episodes before that. He went to [place] in May and was more short of breath when they were out exploring the area. Echo showed severe enlargement of left and right atrium, 47 mm with volume of 93.  He  was more symptomatic with fatigue, dyspnea, hard to do his usual activities.  F/u in afib clinic 10/26, f/u tikosyn start, he is staying in SR, feels some better, but he has just been in SR x one week and has just been using his CPAP for a few weeks, so he anticipates that he will be feeling better with time. He is very serious that he wants to lose weight and become more active.  F/u in afib clinic 12/7, one month after tikosyn start. He is staying in SR. Bp has improved on spironolactone and K+ levels improved. He feels well. Being complaint with CPAP and Tikosyn, apixaban.  F/u with afib clinic, 8/8. He went back into afib yesterday. He does not  know of any trigger. Continues on Tikosyn and eliquis, no missed doses. He has noted some low BP readings at home. Feels sluggish in afib.  He is back in afib clinic, 10/15. After visit on 8/8, pt  did spontaneously revert to SR and did not require cardioversion. However, he has been out of rhythm in rate controlled afib  X 2 weeks and will pursue cardioversion. He does not know any definite trigger.  F/u in clinic after cardioversion 11/4 after successful cardioversion.He has not noted any further afib.   F/u in afib clinic, 05/30/18. He reports that he is doing well staying in rhythm, compliant with Tikosyn.   Today, he denies symptoms of palpitations, chest pain,   orthopnea, PND, lower extremity edema, dizziness, presyncope, syncope, or neurologic sequela.  The patient is tolerating medications without difficulties and is otherwise without complaint today.   Past Medical History:  Diagnosis Date  . Arthritis    "left knee; probably in the shoulders; left hip" (02/05/2017)  . Chronic bronchitis (HCC)    "get it ~ q other year" (02/05/2017)  . Chronic urticaria   . CKD (chronic kidney disease), stage II    pt denies this hx on 02/05/2017  . Complication of anesthesia    pt states awaken during colonscopy   . Dysrhythmia    a fib  . Essential hypertension   . History of blood transfusion 1961  . History of common duodenal ulcer 1980  . History of kidney stones   . Lymphadenopathy    a. evaluated by heme-onc 04/2016, under  surveillance.  . OSA on CPAP dx'd ~ 09/2016  . Persistent atrial fibrillation   . Pneumonia    "X2" (02/05/2017)  . Pure hypercholesterolemia    Past Surgical History:  Procedure Laterality Date  . ANTERIOR CERVICAL DECOMP/DISCECTOMY FUSION  04/2005   "used piece from my hip"  . CARDIOVERSION N/A 01/14/2017   Procedure: CARDIOVERSION;  Surgeon: Jake BatheSkains, Loghan C, MD;  [facility];  Service: Cardiovascular;  Laterality: N/A;  . CARDIOVERSION N/A 02/07/2017   Procedure: CARDIOVERSION;  Surgeon: Chilton Siandolph, Tiffany, MD;  [facility];  Service: Cardiovascular;  Laterality: N/A;  . CARDIOVERSION N/A 02/10/2018   Procedure: CARDIOVERSION;   Surgeon: Thurmon Fairroitoru, Mihai, MD;  Location: [facility] ENDOSCOPY;  Service: Cardiovascular;  Laterality: N/A;  . COLONOSCOPY W/ BIOPSIES AND POLYPECTOMY  ~ 2012   "benign"  . ELBOW SURGERY Right    "Tommy John surgery"  . EXTRACORPOREAL SHOCK WAVE LITHOTRIPSY  1990s  . INGUINAL HERNIA REPAIR Right 1980  . JOINT REPLACEMENT     left knee  . KNEE ARTHROSCOPY Right ~ 1990  . SHOULDER ARTHROSCOPY W/ ROTATOR CUFF REPAIR Right 2000s X 2  . TONSILLECTOMY     "I think I did"  . TOTAL HIP ARTHROPLASTY Left 06/04/2017   Procedure: LEFT TOTAL HIP ARTHROPLASTY ANTERIOR APPROACH;  Surgeon: Durene Romanslin, Matthew, MD;  Location: [facility] ORS;  Service: Orthopedics;  Laterality: Left;  . TOTAL KNEE ARTHROPLASTY Left 02/07/2015   Procedure: LEFT TOTAL KNEE ARTHROPLASTY;  Surgeon: Durene RomansMatthew Olin, MD;  Location: [facility] ORS;  Service: Orthopedics;  Laterality: Left;    Current Outpatient Medications  Medication Sig Dispense Refill  . albuterol (PROVENTIL HFA;VENTOLIN HFA) 108 (90 Base) MCG/ACT inhaler Inhale 1-2 puffs into the lungs every 6 (six) hours as needed for shortness of breath or wheezing.    Marland Kitchen. atorvastatin (LIPITOR) 40 MG tablet Take 40 mg by mouth every evening.    . dofetilide (TIKOSYN) 500 MCG capsule TAKE 1 CAPSULE BY MOUTH TWICE DAILY (Patient taking differently: Take 500 mcg by mouth 2 (two) times daily. ) 180 capsule 2  . ELIQUIS 5 MG TABS tablet TAKE 1 TABLET TWICE A DAY (Patient taking differently: Take 5 mg by mouth 2 (two) times daily. ) 180 tablet 2  . famotidine (PEPCID) 40 MG tablet Take 40 mg by mouth 2 (two) times daily.    . fluticasone (FLONASE) 50 MCG/ACT nasal spray Place 1 spray into both nostrils daily as needed for allergies.    Marland Kitchen. KLOR-CON M20 20 MEQ tablet TAKE 1 TABLET TWICE A DAY 180 tablet 3  . losartan (COZAAR) 100 MG tablet Take 100 mg by mouth daily.     . metoprolol tartrate (LOPRESSOR) 25 MG tablet Take 37.5 mg by mouth 2 (two) times daily.     . NON FORMULARY Duexes- ibuprofen with pepcid  coating  As needed    . spironolactone (ALDACTONE) 25 MG tablet TAKE 1 TABLET DAILY (DOSE  INCREASE) 90 tablet 3  . tadalafil (ADCIRCA/CIALIS) 20 MG tablet Take 40-100 mg by mouth daily as needed for erectile dysfunction.    Marland Kitchen. EPIPEN 2-PAK 0.3 MG/0.3ML SOAJ injection Inject 0.3 mg into the muscle daily as needed for anaphylaxis.  3  . methocarbamol (ROBAXIN) 500 MG tablet Take 1 tablet (500 mg total) by mouth every 6 (six) hours as needed for muscle spasms. (Patient not taking: Reported on 05/30/2018) 40 tablet 0   No current facility-administered medications for this encounter.     Allergies  Allergen Reactions  . Contrast Media [  Iodinated Diagnostic Agents] Shortness Of Breath and Palpitations    Broke out in sweats.  . Penicillins Anaphylaxis    Has patient had a PCN reaction causing immediate rash, facial/tongue/throat swelling, SOB or lightheadedness with hypotension: Yes Has patient had a PCN reaction causing severe rash involving mucus membranes or skin necrosis: No Has patient had a PCN reaction that required hospitalization:Yes Has patient had a PCN reaction occurring within the last 10 years: No If all of the above answers are "NO", then may proceed with Cephalosporin use.   Marland Kitchen Singulair [Montelukast Sodium] Swelling    Lips, mouth, and hand swelling     Social History   Socioeconomic History  . Marital status: Married    Spouse name: Not on file  . Number of children: Not on file  . Years of education: Not on file  . Highest education level: Not on file  Occupational History  . Not on file  Social Needs  . Financial resource strain: Not on file  . Food insecurity:    Worry: Not on file    Inability: Not on file  . Transportation needs:    Medical: Not on file    Non-medical: Not on file  Tobacco Use  . Smoking status: Never Smoker  . Smokeless tobacco: Never Used  Substance and Sexual Activity  . Alcohol use: Yes    Comment: 02/05/2017 "2-4 glasses of  wine/month"  . Drug use: No  . Sexual activity: Yes  Lifestyle  . Physical activity:    Days per week: Not on file    Minutes per session: Not on file  . Stress: Not on file  Relationships  . Social connections:    Talks on phone: Not on file    Gets together: Not on file    Attends religious service: Not on file    Active member of club or organization: Not on file    Attends meetings of clubs or organizations: Not on file    Relationship status: Not on file  . Intimate partner violence:    Fear of current or ex partner: Not on file    Emotionally abused: Not on file    Physically abused: Not on file    Forced sexual activity: Not on file  Other Topics Concern  . Not on file  Social History Narrative   Pt lives in [place].  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    Family History  Problem Relation Age of Onset  . Hypertension Mother   . Hyperlipidemia Mother   . Breast cancer Mother   . Uterine cancer Mother   . Sleep apnea Mother   . Hypertension Brother   . Hyperlipidemia Brother   . Cancer Brother     ROS- All systems are reviewed and negative except as per the HPI above  Physical Exam: Vitals:   05/30/18 1015  BP: 124/80  Pulse: 61  Weight: 134.7 kg  Height: 6\' 2"  (1.88 m)   Wt Readings from Last 3 Encounters:  05/30/18 134.7 kg  02/24/18 132.9 kg  02/05/18 132.9 kg    Labs: Lab Results  Component Value Date   NA 139 02/05/2018   K 4.5 02/05/2018   CL 108 02/05/2018   CO2 26 02/05/2018   GLUCOSE 101 (H) 02/05/2018   BUN 15 02/05/2018   CREATININE 1.26 (H) 02/05/2018   CALCIUM 9.0 02/05/2018   MG 1.8 02/05/2018   Lab Results  Component Value Date  INR 1.1 12/31/2016   No results found for: CHOL, HDL, LDLCALC, TRIG   GEN- The patient is well appearing, alert and oriented x 3 today.   Head- normocephalic, atraumatic Eyes-  Sclera clear, conjunctiva pink Ears- hearing intact Oropharynx- clear Neck- supple, no  JVP Lymph- no cervical lymphadenopathy Lungs- Clear to ausculation bilaterally, normal work of breathing Heart- regular rate and rhythm, no murmurs, rubs or gallops, PMI not laterally displaced GI- soft, NT, ND, + BS Extremities- no clubbing, cyanosis, or edema MS- no significant deformity or atrophy Skin- no rash or lesion Psych- euthymic mood, full affect Neuro- strength and sensation are intact  EKG- Sinus  Rhythm at 61 bpm, pr int 154 ms, qrs int 102  Ms, qtc 469 ms Epic records reviewed Echo-Study Conclusions  - Left ventricle: The cavity size was normal. There was mild   concentric hypertrophy. Systolic function was normal. The   estimated ejection fraction was in the range of 60% to 65%. Wall   motion was normal; there were no regional wall motion   abnormalities. - Aortic valve: Transvalvular velocity was within the normal range.   There was no stenosis. There was no regurgitation. - Aorta: Ascending aortic diameter: 38 mm (S). - Ascending aorta: The ascending aorta was mildly dilated. - Mitral valve: Transvalvular velocity was within the normal range.   There was no evidence for stenosis. There was trivial   regurgitation. - Left atrium: The atrium was severely dilated. - Right ventricle: The cavity size was normal. Wall thickness was   normal. Systolic function was normal. - Right atrium: The atrium was severely dilated. - Tricuspid valve: There was trivial regurgitation.   Assessment and Plan: 1. Persistent symptomatic afib   Maintaining  SR on dofetilide 500 mcg bid, qt stable Continue CPAP Continue  metoprolol without change at 37.5 mg bid Continue eliquis 5 mg bid  for chadsvasc score of 1, no missed doses bmet/mag  2. HTN Well controlled  Continue  spironolactone 25 mg qd, losartan 100 mg qd  F/u in afib clinic in 4 months  Garrett Hanson, ANP-C Afib Clinic [facility] [address] [place], [place] [address] [phone]

## 2018-07-31 ENCOUNTER — Other Ambulatory Visit (HOSPITAL_COMMUNITY): Payer: Self-pay | Admitting: Nurse Practitioner

## 2018-09-01 IMAGING — CR DG HIP (WITH OR WITHOUT PELVIS) 2-3V*L*
2 series · 2 of 2 positions shown · non-contrast
Comparison: None.

CLINICAL DATA: Pt c/o left groin pain x 1 month. Pt stated hx of
left hip pain with injection in August 2016. Hx of no injury. Hx of
arthritis of left knee, CKD, HTN.

EXAM:
DG HIP (WITH OR WITHOUT PELVIS) 2-3V LEFT

[w pelvis * (1 of 2)]
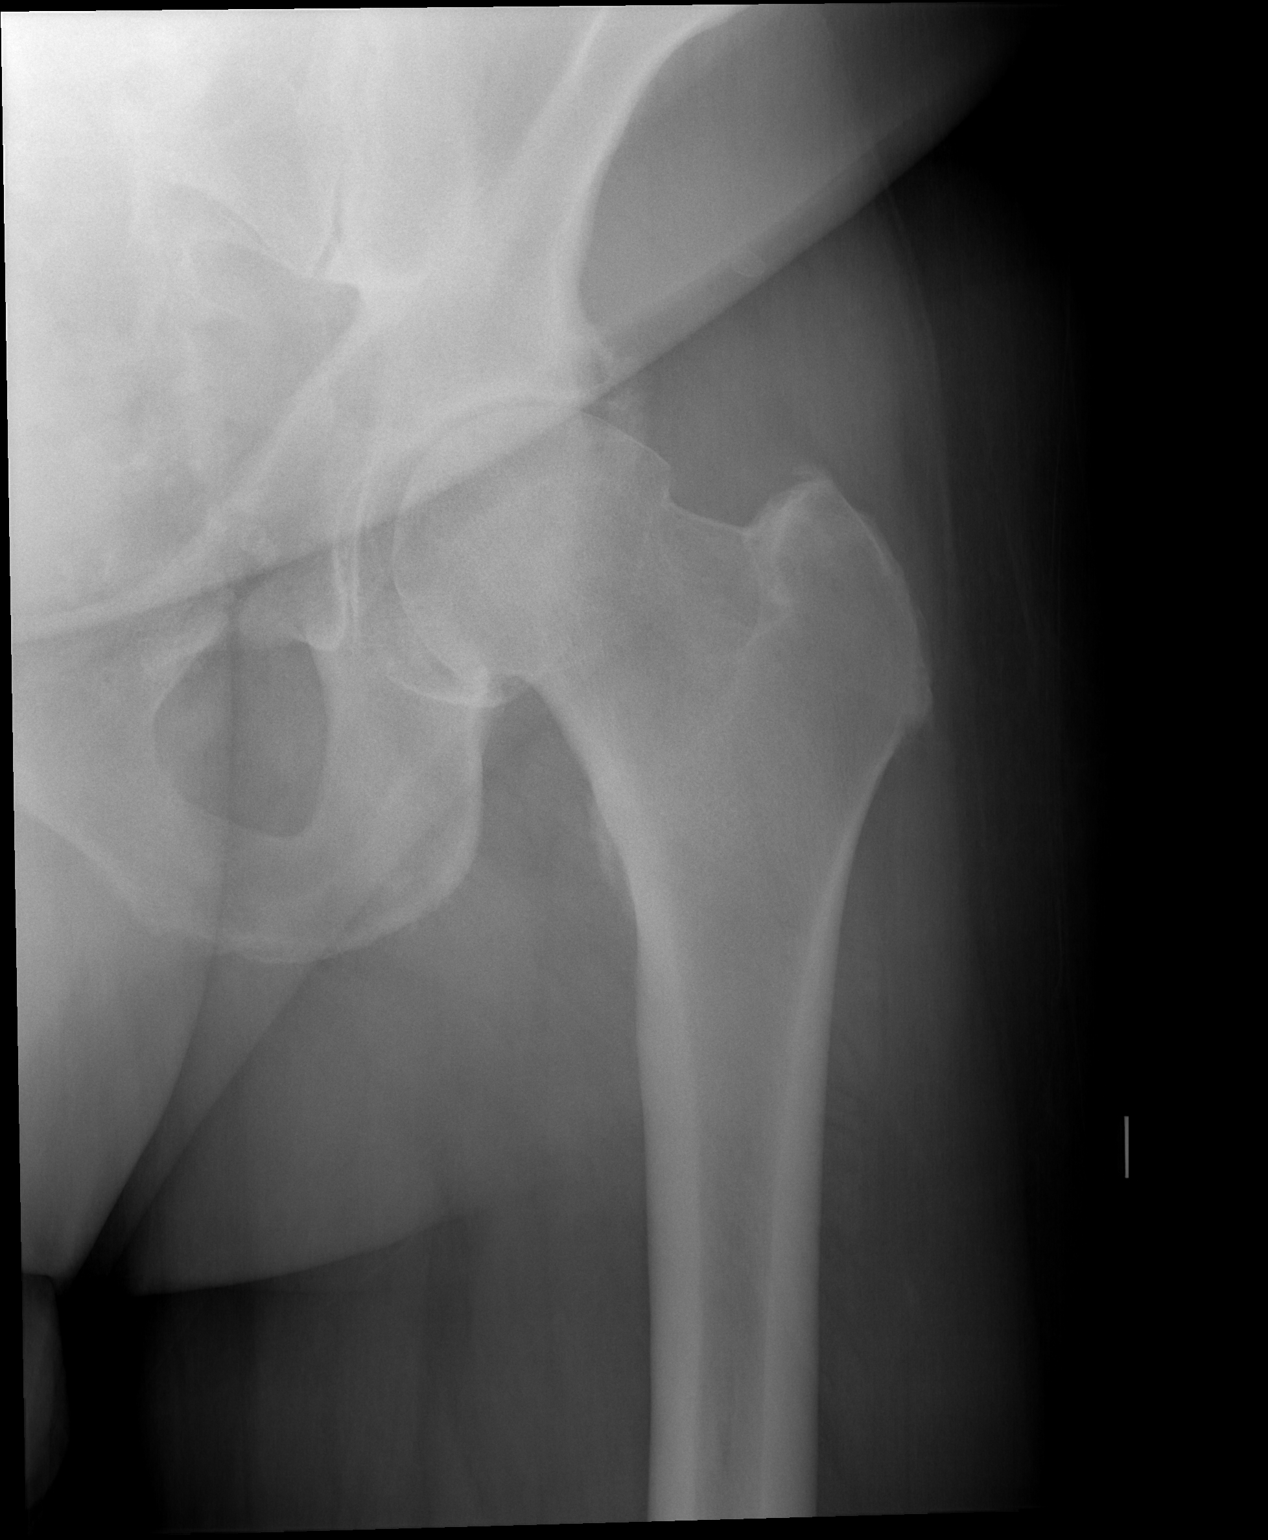

[w pelvis * (2 of 2)]
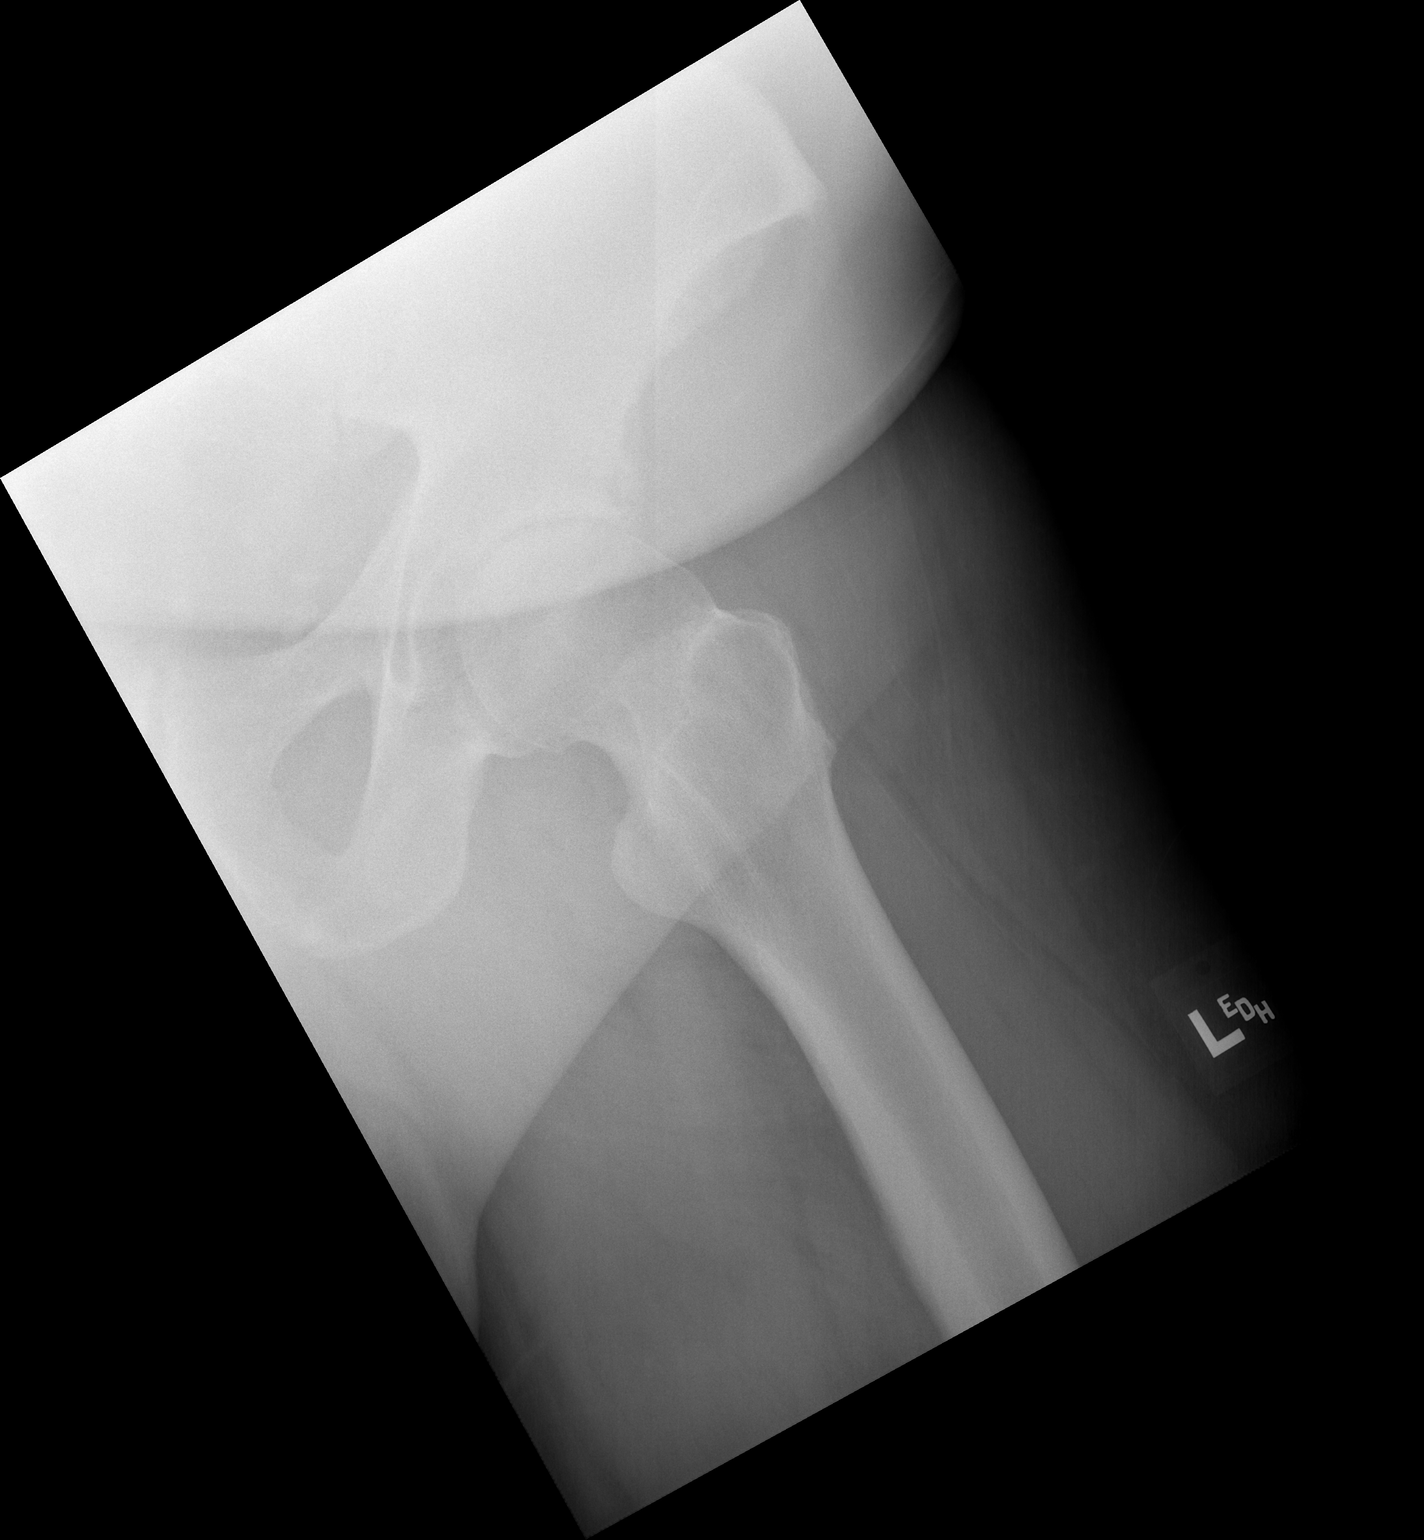

[2 of 2 positions shown; findings below may reference images not displayed]

FINDINGS: Two views of the left hip are provided. Osseous alignment appears
normal. No fracture line or displaced fracture fragment seen. No
acute or suspicious osseous lesion. Mild degenerative change noted
at the left hip, as manifested by slight joint space narrowing and
osseous spurring. Soft tissues about the left hip are unremarkable.
IMPRESSION: Mild degenerative change at the left hip.  No acute findings.

## 2018-09-11 ENCOUNTER — Other Ambulatory Visit: Payer: Self-pay | Admitting: Nurse Practitioner

## 2018-12-01 ENCOUNTER — Other Ambulatory Visit: Payer: Self-pay | Admitting: Podiatry

## 2018-12-01 ENCOUNTER — Encounter: Payer: Self-pay | Admitting: Podiatry

## 2018-12-01 ENCOUNTER — Other Ambulatory Visit: Payer: Self-pay

## 2018-12-01 ENCOUNTER — Ambulatory Visit (INDEPENDENT_AMBULATORY_CARE_PROVIDER_SITE_OTHER): Payer: Managed Care, Other (non HMO) | Admitting: Podiatry

## 2018-12-01 ENCOUNTER — Ambulatory Visit (INDEPENDENT_AMBULATORY_CARE_PROVIDER_SITE_OTHER): Payer: Managed Care, Other (non HMO)

## 2018-12-01 VITALS — BP 127/77 | HR 57 | Temp 98.2°F

## 2018-12-01 DIAGNOSIS — M21611 Bunion of right foot: Secondary | ICD-10-CM | POA: Diagnosis not present

## 2018-12-01 DIAGNOSIS — M7751 Other enthesopathy of right foot: Secondary | ICD-10-CM

## 2018-12-01 DIAGNOSIS — M778 Other enthesopathies, not elsewhere classified: Secondary | ICD-10-CM

## 2018-12-03 NOTE — Progress Notes (Signed)
   HPI: 58 y.o. male presenting today as a new patient with a chief complaint of burning pain noted to the medial right great toe that began several weeks ago. He states it feels as if he is walking on a bruise located under the hallux. He has not had any treatment for the symptoms. Walking increases the pain. Patient is here for further evaluation and treatment.   Past Medical History:  Diagnosis Date  . Arthritis    "left knee; probably in the shoulders; left hip" (02/05/2017)  . Chronic bronchitis ([place])    "get it ~ q other year" (02/05/2017)  . Chronic urticaria   . CKD (chronic kidney disease), stage II    pt denies this hx on 02/05/2017  . Complication of anesthesia    pt states awaken during colonscopy   . Dysrhythmia    a fib  . Essential hypertension   . History of blood transfusion 1961  . History of common duodenal ulcer 1980  . History of kidney stones   . Lymphadenopathy    a. evaluated by heme-onc 04/2016, under surveillance.  . OSA on CPAP dx'd ~ 09/2016  . Persistent atrial fibrillation   . Pneumonia    "X2" (02/05/2017)  . Pure hypercholesterolemia      Physical Exam: General: The patient is alert and oriented x3 in no acute distress.  Dermatology: Skin is warm, dry and supple bilateral lower extremities. Negative for open lesions or macerations.  Vascular: Palpable pedal pulses bilaterally. No edema or erythema noted. Capillary refill within normal limits.  Neurological: Epicritic and protective threshold grossly intact bilaterally.   Musculoskeletal Exam: Range of motion within normal limits to all pedal and ankle joints bilateral. Muscle strength 5/5 in all groups bilateral.   Radiographic Exam:  Normal osseous mineralization. Joint spaces preserved. No fracture/dislocation/boney destruction.    Assessment: 1. 1st MPJ capsulitis right    Plan of Care:  1. Patient evaluated. X-Rays reviewed.  2. Injection of 0.5 mLs Celestone Soluspan injected into  the 1st MPJ of the right foot.  3. Post op shoe dispensed.  4. Cannot taken NSAIDs due to being on Eliquis.  5. Declined Medrol Dose Pak due to side effects.  6. Return to clinic in 4 weeks.       Edrick Kins, DPM [facility]  Dr. Edrick Kins, DPM    [address], [place] [address]                Office [phone]  Fax [phone]

## 2018-12-15 ENCOUNTER — Other Ambulatory Visit: Payer: Self-pay | Admitting: Nurse Practitioner

## 2018-12-31 ENCOUNTER — Ambulatory Visit (INDEPENDENT_AMBULATORY_CARE_PROVIDER_SITE_OTHER): Payer: Managed Care, Other (non HMO) | Admitting: Podiatry

## 2018-12-31 ENCOUNTER — Other Ambulatory Visit: Payer: Self-pay

## 2018-12-31 ENCOUNTER — Encounter: Payer: Self-pay | Admitting: Podiatry

## 2018-12-31 DIAGNOSIS — M7751 Other enthesopathy of right foot: Secondary | ICD-10-CM | POA: Diagnosis not present

## 2018-12-31 DIAGNOSIS — G5791 Unspecified mononeuropathy of right lower limb: Secondary | ICD-10-CM

## 2018-12-31 MED ORDER — GABAPENTIN 100 MG PO CAPS: 100.0000 mg | ORAL_CAPSULE | Freq: Three times a day (TID) | ORAL | 0 refills | Status: DC

## 2019-01-03 NOTE — Progress Notes (Signed)
   HPI: 58 y.o. male presenting today with follow up evaluation of 1st MPJ capsulitis of the right foot. He states the pain has improved. He reports some mild burning pain and tenderness that is not as severe as it previously was. He states the injection helped to alleviate his pain. Flexion of the joint and applying pressure to it increases the pain. Patient is here for further evaluation and treatment.   Past Medical History:  Diagnosis Date  . Arthritis    "left knee; probably in the shoulders; left hip" (02/05/2017)  . Chronic bronchitis ([place])    "get it ~ q other year" (02/05/2017)  . Chronic urticaria   . CKD (chronic kidney disease), stage II    pt denies this hx on 02/05/2017  . Complication of anesthesia    pt states awaken during colonscopy   . Dysrhythmia    a fib  . Essential hypertension   . History of blood transfusion 1961  . History of common duodenal ulcer 1980  . History of kidney stones   . Lymphadenopathy    a. evaluated by heme-onc 04/2016, under surveillance.  . OSA on CPAP dx'd ~ 09/2016  . Persistent atrial fibrillation   . Pneumonia    "X2" (02/05/2017)  . Pure hypercholesterolemia      Physical Exam: General: The patient is alert and oriented x3 in no acute distress.  Dermatology: Skin is warm, dry and supple bilateral lower extremities. Negative for open lesions or macerations.  Vascular: Palpable pedal pulses bilaterally. No edema or erythema noted. Capillary refill within normal limits.  Neurological: Epicritic and protective threshold grossly intact bilaterally.   Musculoskeletal Exam: Pain with palpation noted to the 1st MPJ of the right foot. Range of motion within normal limits to all pedal and ankle joints bilateral. Muscle strength 5/5 in all groups bilateral.   Assessment: 1. 1st MPJ capsulitis right - improved 2. Neuritis 1st MPJ right - dorsal    Plan of Care:  1. Patient evaluated. 2. Injection of 0.5 mLs Celestone Soluspan injected  into the 1st MPJ of the right foot.  3. Prescription for Gabapentin 100 mg #60 provided to patient.  4. Recommended good shoe gear.  5. Return to clinic as needed.        Edrick Kins, DPM [facility]  Dr. Edrick Kins, DPM    [address], [place] [address]                Office [phone]  Fax [phone]

## 2019-01-05 ENCOUNTER — Telehealth: Payer: Self-pay

## 2019-01-05 ENCOUNTER — Ambulatory Visit: Payer: Managed Care, Other (non HMO) | Admitting: Physician Assistant

## 2019-01-05 NOTE — Telephone Encounter (Signed)
Called patient to inform him that he missed his appointment today and will need to reschedule by calling the office.

## 2019-01-20 ENCOUNTER — Telehealth (HOSPITAL_COMMUNITY): Payer: Self-pay | Admitting: *Deleted

## 2019-01-20 NOTE — Telephone Encounter (Signed)
Pt called in stating he went back into afib yesterday evening. HR was initially in the low 100 range - today HR is more in the 80s. BP is normal. Per Roderic Palau NP will try increasing metoprolol to 50mg  twice a day (hold losartan if SBP becomes low). Pt will call back in 2-3 days with update.

## 2019-01-26 ENCOUNTER — Encounter (HOSPITAL_COMMUNITY): Payer: Self-pay | Admitting: Nurse Practitioner

## 2019-01-26 ENCOUNTER — Other Ambulatory Visit: Payer: Self-pay

## 2019-01-26 ENCOUNTER — Ambulatory Visit (HOSPITAL_COMMUNITY)
Admission: RE | Admit: 2019-01-26 | Discharge: 2019-01-26 | Disposition: A | Discharge status: Home / Self Care | Payer: Managed Care, Other (non HMO) | Source: Ambulatory Visit | Attending: Nurse Practitioner | Admitting: Nurse Practitioner

## 2019-01-26 VITALS — BP 118/70 | HR 94 | Ht 74.0 in | Wt 300.2 lb

## 2019-01-26 DIAGNOSIS — G4733 Obstructive sleep apnea (adult) (pediatric): Secondary | ICD-10-CM | POA: Insufficient documentation

## 2019-01-26 DIAGNOSIS — N182 Chronic kidney disease, stage 2 (mild): Secondary | ICD-10-CM | POA: Insufficient documentation

## 2019-01-26 DIAGNOSIS — M1712 Unilateral primary osteoarthritis, left knee: Secondary | ICD-10-CM | POA: Diagnosis not present

## 2019-01-26 DIAGNOSIS — I129 Hypertensive chronic kidney disease with stage 1 through stage 4 chronic kidney disease, or unspecified chronic kidney disease: Secondary | ICD-10-CM | POA: Insufficient documentation

## 2019-01-26 DIAGNOSIS — J42 Unspecified chronic bronchitis: Secondary | ICD-10-CM | POA: Insufficient documentation

## 2019-01-26 DIAGNOSIS — E7800 Pure hypercholesterolemia, unspecified: Secondary | ICD-10-CM | POA: Diagnosis not present

## 2019-01-26 DIAGNOSIS — Z79899 Other long term (current) drug therapy: Secondary | ICD-10-CM | POA: Diagnosis not present

## 2019-01-26 DIAGNOSIS — L508 Other urticaria: Secondary | ICD-10-CM | POA: Insufficient documentation

## 2019-01-26 DIAGNOSIS — Z8249 Family history of ischemic heart disease and other diseases of the circulatory system: Secondary | ICD-10-CM | POA: Insufficient documentation

## 2019-01-26 DIAGNOSIS — I4819 Other persistent atrial fibrillation: Secondary | ICD-10-CM | POA: Diagnosis present

## 2019-01-26 DIAGNOSIS — Z7901 Long term (current) use of anticoagulants: Secondary | ICD-10-CM | POA: Insufficient documentation

## 2019-01-26 LAB — CBC
HCT: 51.2 % (ref 39.0–52.0)
Hemoglobin: 16.4 g/dL (ref 13.0–17.0)
MCH: 29.3 pg (ref 26.0–34.0)
MCHC: 32 g/dL (ref 30.0–36.0)
MCV: 91.4 fL (ref 80.0–100.0)
Platelets: 245 10*3/uL (ref 150–400)
RBC: 5.6 MIL/uL (ref 4.22–5.81)
RDW: 13.2 % (ref 11.5–15.5)
WBC: 8 10*3/uL (ref 4.0–10.5)
nRBC: 0 % (ref 0.0–0.2)

## 2019-01-26 LAB — BASIC METABOLIC PANEL
Anion gap: 9 (ref 5–15)
BUN: 15 mg/dL (ref 6–20)
CO2: 26 mmol/L (ref 22–32)
Calcium: 9.1 mg/dL (ref 8.9–10.3)
Chloride: 105 mmol/L (ref 98–111)
Creatinine, Ser: 1.25 mg/dL — ABNORMAL HIGH (ref 0.61–1.24)
GFR calc Af Amer: >60 mL/min (ref >60)
GFR calc non Af Amer: >60 mL/min (ref >60)
Glucose, Bld: 116 mg/dL — ABNORMAL HIGH (ref 70–99)
Potassium: 5 mmol/L (ref 3.5–5.1)
Sodium: 140 mmol/L (ref 135–145)

## 2019-01-26 LAB — MAGNESIUM: Magnesium: 2.2 mg/dL (ref 1.7–2.4)

## 2019-01-26 NOTE — Progress Notes (Addendum)
Primary Care Physician: Daisy Floro, MD Referring Physician: [facility] Care Cardiologist: Garrett Hanson is a 58 y.o. male with a h/o HTN, obesity, recently diagnosed sleep apnea, started CPAP 4 weeks ago. He was found to be in rate controlled afib at time of physical mid summer and was sent to Cardiology. He was set up for cardioversion, 9/24,  and unfortunately would not shock out. He came into  the afib clinic for other options.  He reports that 20 years ago he had a couple of episodes of rapid heart rate and possibly Dr. Graciela Husbands, can't for sure remember the name, did cardiac mapping, but no ablation was done and since this was a very infrequent thing, no other treatment was indicated at the time. Now, pt thinks he could have been in afib for a year, as it was not present on his last physical exam. He may have had some intermittent episodes before that. He went to [place] in May and was more short of breath when they were out exploring the area. Echo showed severe enlargement of left and right atrium, 47 mm with volume of 93.  He  was more symptomatic with fatigue, dyspnea, hard to do his usual activities.  F/u in afib clinic 10/26, f/u tikosyn start, he is staying in SR, feels some better, but he has just been in SR x one week and has just been using his CPAP for a few weeks, so he anticipates that he will be feeling better with time. He is very serious that he wants to lose weight and become more active.  F/u in afib clinic 12/7, one month after tikosyn start. He is staying in SR. Bp has improved on spironolactone and K+ levels improved. He feels well. Being complaint with CPAP and Tikosyn, apixaban.  F/u with afib clinic, 8/8. He went back into afib yesterday. He does not  know of any trigger. Continues on Tikosyn and eliquis, no missed doses. He has noted some low BP readings at home. Feels sluggish in afib.  He is back in afib clinic, 10/15. After visit on 8/8, pt  did spontaneously revert to SR and did not require cardioversion. However, he has been out of rhythm in rate controlled afib  X 2 weeks and will pursue cardioversion. He does not know any definite trigger.  F/u in clinic after cardioversion 11/4 after successful cardioversion.He has not noted any further afib.   F/u in afib clinic, 05/30/18. He reports that he is doing well staying in rhythm, compliant with Tikosyn.   F/u in afib clinic 01/26/19 for return of afib last week. He feels the stressor for afib is that his brother had sudden death in the last few weeks presumed to be from an MI.His mother was then in the hospital for chest pain but no MI. He continues on Tikosyn. He would prefer a cardioversion instead of proceeding to ablation. No missed anticoagulation.   Today, he denies symptoms of palpitations, chest pain,   orthopnea, PND, lower extremity edema, dizziness, presyncope, syncope, or neurologic sequela.  The patient is tolerating medications without difficulties and is otherwise without complaint today.   Past Medical History:  Diagnosis Date  . Arthritis    "left knee; probably in the shoulders; left hip" (02/05/2017)  . Chronic bronchitis (HCC)    "get it ~ q other year" (02/05/2017)  . Chronic urticaria   . CKD (chronic kidney disease), stage II    pt  denies this hx on 02/05/2017  . Complication of anesthesia    pt states awaken during colonscopy   . Dysrhythmia    a fib  . Essential hypertension   . History of blood transfusion 1961  . History of common duodenal ulcer 1980  . History of kidney stones   . Lymphadenopathy    a. evaluated by heme-onc 04/2016, under surveillance.  . OSA on CPAP dx'd ~ 09/2016  . Persistent atrial fibrillation   . Pneumonia    "X2" (02/05/2017)  . Pure hypercholesterolemia    Past Surgical History:  Procedure Laterality Date  . ANTERIOR CERVICAL DECOMP/DISCECTOMY FUSION  04/2005   "used piece from my hip"  . CARDIOVERSION N/A 01/14/2017    Procedure: CARDIOVERSION;  Surgeon: Jerline Pain, MD;  Location: [facility] ENDOSCOPY;  Service: Cardiovascular;  Laterality: N/A;  . CARDIOVERSION N/A 02/07/2017   Procedure: CARDIOVERSION;  Surgeon: Skeet Latch, MD;  Location: [facility] ENDOSCOPY;  Service: Cardiovascular;  Laterality: N/A;  . CARDIOVERSION N/A 02/10/2018   Procedure: CARDIOVERSION;  Surgeon: Sanda Klein, MD;  Location: [facility] ENDOSCOPY;  Service: Cardiovascular;  Laterality: N/A;  . COLONOSCOPY W/ BIOPSIES AND POLYPECTOMY  ~ 2012   "benign"  . ELBOW SURGERY Right    "Lyons surgery"  . EXTRACORPOREAL SHOCK WAVE LITHOTRIPSY  1990s  . INGUINAL HERNIA REPAIR Right 1980  . JOINT REPLACEMENT     left knee  . KNEE ARTHROSCOPY Right ~ 1990  . SHOULDER ARTHROSCOPY W/ ROTATOR CUFF REPAIR Right 2000s X 2  . TONSILLECTOMY     "I think I did"  . TOTAL HIP ARTHROPLASTY Left 06/04/2017   Procedure: LEFT TOTAL HIP ARTHROPLASTY ANTERIOR APPROACH;  Surgeon: Paralee Cancel, MD;  Location: [facility] ORS;  Service: Orthopedics;  Laterality: Left;  . TOTAL KNEE ARTHROPLASTY Left 02/07/2015   Procedure: LEFT TOTAL KNEE ARTHROPLASTY;  Surgeon: Paralee Cancel, MD;  Location: [facility] ORS;  Service: Orthopedics;  Laterality: Left;    Current Outpatient Medications  Medication Sig Dispense Refill  . albuterol (PROVENTIL HFA;VENTOLIN HFA) 108 (90 Base) MCG/ACT inhaler Inhale 1-2 puffs into the lungs as needed for wheezing or shortness of breath.     Marland Kitchen atorvastatin (LIPITOR) 40 MG tablet Take 40 mg by mouth every evening.    . dofetilide (TIKOSYN) 500 MCG capsule TAKE 1 CAPSULE BY MOUTH TWICE A DAY 60 capsule 1  . ELIQUIS 5 MG TABS tablet TAKE 1 TABLET TWICE A DAY 180 tablet 2  . EPIPEN 2-PAK 0.3 MG/0.3ML SOAJ injection Inject 0.3 mg into the muscle daily as needed for anaphylaxis.  3  . famotidine (PEPCID) 40 MG tablet Take 40 mg by mouth 2 (two) times daily.    . fluticasone (FLONASE) 50 MCG/ACT nasal spray Place 1 spray into both nostrils daily as needed for  allergies.    Marland Kitchen KLOR-CON M20 20 MEQ tablet TAKE 1 TABLET TWICE A DAY 180 tablet 3  . methocarbamol (ROBAXIN) 500 MG tablet Take 1 tablet (500 mg total) by mouth every 6 (six) hours as needed for muscle spasms. (Patient taking differently: Take 500 mg by mouth as needed for muscle spasms. ) 40 tablet 0  . metoprolol tartrate (LOPRESSOR) 25 MG tablet Take 37.5 mg by mouth 2 (two) times daily. Takes 50mg  twice daily x 4 days    . nystatin (MYCOSTATIN/NYSTOP) powder as needed.     Marland Kitchen spironolactone (ALDACTONE) 25 MG tablet TAKE 1 TABLET DAILY (DOSE  INCREASE) 90 tablet 3  . tadalafil (ADCIRCA/CIALIS) 20 MG tablet Take 40-100  mg by mouth daily as needed for erectile dysfunction.    . gabapentin (NEURONTIN) 100 MG capsule Take 1 capsule (100 mg total) by mouth 3 (three) times daily. (Patient not taking: Reported on 01/26/2019) 60 capsule 0  . losartan (COZAAR) 100 MG tablet Take 100 mg by mouth daily.     . NON FORMULARY Duexes- ibuprofen with pepcid coating  As needed     No current facility-administered medications for this encounter.     Allergies  Allergen Reactions  . Contrast Media [Iodinated Diagnostic Agents] Shortness Of Breath and Palpitations    Broke out in sweats.  . Penicillins Anaphylaxis    Has patient had a PCN reaction causing immediate rash, facial/tongue/throat swelling, SOB or lightheadedness with hypotension: Yes Has patient had a PCN reaction causing severe rash involving mucus membranes or skin necrosis: No Has patient had a PCN reaction that required hospitalization:Yes Has patient had a PCN reaction occurring within the last 10 years: No If all of the above answers are "NO", then may proceed with Cephalosporin use.   Marland Kitchen Singulair [Montelukast Sodium] Swelling    Lips, mouth, and hand swelling     Social History   Socioeconomic History  . Marital status: Married    Spouse name: Not on file  . Number of children: Not on file  . Years of education: Not on file  .  Highest education level: Not on file  Occupational History  . Not on file  Social Needs  . Financial resource strain: Not on file  . Food insecurity    Worry: Not on file    Inability: Not on file  . Transportation needs    Medical: Not on file    Non-medical: Not on file  Tobacco Use  . Smoking status: Never Smoker  . Smokeless tobacco: Never Used  Substance and Sexual Activity  . Alcohol use: Yes    Comment: 02/05/2017 "2-4 glasses of wine/month"  . Drug use: No  . Sexual activity: Yes  Lifestyle  . Physical activity    Days per week: Not on file    Minutes per session: Not on file  . Stress: Not on file  Relationships  . Social Musician on phone: Not on file    Gets together: Not on file    Attends religious service: Not on file    Active member of club or organization: Not on file    Attends meetings of clubs or organizations: Not on file    Relationship status: Not on file  . Intimate partner violence    Fear of current or ex partner: Not on file    Emotionally abused: Not on file    Physically abused: Not on file    Forced sexual activity: Not on file  Other Topics Concern  . Not on file  Social History Narrative   Pt lives in [place].  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    Family History  Problem Relation Age of Onset  . Hypertension Mother   . Hyperlipidemia Mother   . Breast cancer Mother   . Uterine cancer Mother   . Sleep apnea Mother   . Hypertension Brother   . Hyperlipidemia Brother   . Cancer Brother     ROS- All systems are reviewed and negative except as per the HPI above  Physical Exam: There were no vitals filed for this visit. Wt Readings from Last 3 Encounters:  05/30/18  134.7 kg  02/24/18 132.9 kg  02/05/18 132.9 kg    Labs: Lab Results  Component Value Date   NA 140 05/30/2018   K 4.0 05/30/2018   CL 107 05/30/2018   CO2 23 05/30/2018   GLUCOSE 101 (H) 05/30/2018   BUN 14  05/30/2018   CREATININE 1.12 05/30/2018   CALCIUM 9.2 05/30/2018   MG 2.0 05/30/2018   Lab Results  Component Value Date   INR 1.1 12/31/2016   No results found for: CHOL, HDL, LDLCALC, TRIG   GEN- The patient is well appearing, alert and oriented x 3 today.   Head- normocephalic, atraumatic Eyes-  Sclera clear, conjunctiva pink Ears- hearing intact Oropharynx- clear Neck- supple, no JVP Lymph- no cervical lymphadenopathy Lungs- Clear to ausculation bilaterally, normal work of breathing Heart- regular rate and rhythm, no murmurs, rubs or gallops, PMI not laterally displaced GI- soft, NT, ND, + BS Extremities- no clubbing, cyanosis, or edema MS- no significant deformity or atrophy Skin- no rash or lesion Psych- euthymic mood, full affect Neuro- strength and sensation are intact  EKG- afib at 94 bpm,qrs int 98 ms, qtc 510 ms Epic records reviewed Echo-Study Conclusions  - Left ventricle: The cavity size was normal. There was mild   concentric hypertrophy. Systolic function was normal. The   estimated ejection fraction was in the range of 60% to 65%. Wall   motion was normal; there were no regional wall motion   abnormalities. - Aortic valve: Transvalvular velocity was within the normal range.   There was no stenosis. There was no regurgitation. - Aorta: Ascending aortic diameter: 38 mm (S). - Ascending aorta: The ascending aorta was mildly dilated. - Mitral valve: Transvalvular velocity was within the normal range.   There was no evidence for stenosis. There was trivial   regurgitation. - Left atrium: The atrium was severely dilated. - Right ventricle: The cavity size was normal. Wall thickness was   normal. Systolic function was normal. - Right atrium: The atrium was severely dilated. - Tricuspid valve: There was trivial regurgitation.   Assessment and Plan: 1. Persistent symptomatic afib   Had maintained  SR on dofetilide 500 mcg bid with qt stable until last  week Last cardioversion one year ago  Continue CPAP Continue  metoprolol without change at 37.5 mg bid Continue eliquis 5 mg bid  for chadsvasc score of 1, no missed doses x 3 weeks  Bmet/mag/cbc/covid test   2. HTN Well controlled  Continue  spironolactone 25 mg qd, losartan 100 mg qd  F/u in afib clinic one week after cardioversion He would like to be scheduled for a stress test after resuming SR 2/2 brother's recent history of sudden death from  MI   Garrett Hanson, ANP-C [facility] [address] [place], [place] [address] [phone]

## 2019-01-26 NOTE — Addendum Note (Signed)
Encounter addended by: Sherran Needs, NP on: 01/26/2019 3:11 PM  Actions taken: Clinical Note Signed

## 2019-01-26 NOTE — H&P (View-Only) (Signed)
 Primary Care Physician: Ross, Charles Alan, MD Referring Physician: [facility] Cardiologist: Dr. Croitoru     Garrett Hanson is a 58 y.o. male with a h/o HTN, obesity, recently diagnosed sleep apnea, started CPAP 4 weeks ago. He was found to be in rate controlled afib at time of physical mid summer and was sent to Cardiology. He was set up for cardioversion, 9/24,  and unfortunately would not shock out. He came into  the afib clinic for other options.  He reports that 20 years ago he had a couple of episodes of rapid heart rate and possibly Dr. Klein, can't for sure remember the name, did cardiac mapping, but no ablation was done and since this was a very infrequent thing, no other treatment was indicated at the time. Now, pt thinks he could have been in afib for a year, as it was not present on his last physical exam. He may have had some intermittent episodes before that. He went to [place] in May and was more short of breath when they were out exploring the area. Echo showed severe enlargement of left and right atrium, 47 mm with volume of 93.  He  was more symptomatic with fatigue, dyspnea, hard to do his usual activities.  F/u in afib clinic 10/26, f/u tikosyn start, he is staying in SR, feels some better, but he has just been in SR x one week and has just been using his CPAP for a few weeks, so he anticipates that he will be feeling better with time. He is very serious that he wants to lose weight and become more active.  F/u in afib clinic 12/7, one month after tikosyn start. He is staying in SR. Bp has improved on spironolactone and K+ levels improved. He feels well. Being complaint with CPAP and Tikosyn, apixaban.  F/u with afib clinic, 8/8. He went back into afib yesterday. He does not  know of any trigger. Continues on Tikosyn and eliquis, no missed doses. He has noted some low BP readings at home. Feels sluggish in afib.  He is back in afib clinic, 10/15. After visit on 8/8, pt  did spontaneously revert to SR and did not require cardioversion. However, he has been out of rhythm in rate controlled afib  X 2 weeks and will pursue cardioversion. He does not know any definite trigger.  F/u in clinic after cardioversion 11/4 after successful cardioversion.He has not noted any further afib.   F/u in afib clinic, 05/30/18. He reports that he is doing well staying in rhythm, compliant with Tikosyn.   F/u in afib clinic 01/26/19 for return of afib last week. He feels the stressor for afib is that his brother had sudden death in the last few weeks presumed to be from an MI.His mother was then in the hospital for chest pain but no MI. He continues on Tikosyn. He would prefer a cardioversion instead of proceeding to ablation. No missed anticoagulation.   Today, he denies symptoms of palpitations, chest pain,   orthopnea, PND, lower extremity edema, dizziness, presyncope, syncope, or neurologic sequela.  The patient is tolerating medications without difficulties and is otherwise without complaint today.   Past Medical History:  Diagnosis Date  . Arthritis    "left knee; probably in the shoulders; left hip" (02/05/2017)  . Chronic bronchitis (HCC)    "get it ~ q other year" (02/05/2017)  . Chronic urticaria   . CKD (chronic kidney disease), stage II    pt   denies this hx on 02/05/2017  . Complication of anesthesia    pt states awaken during colonscopy   . Dysrhythmia    a fib  . Essential hypertension   . History of blood transfusion 1961  . History of common duodenal ulcer 1980  . History of kidney stones   . Lymphadenopathy    a. evaluated by heme-onc 04/2016, under surveillance.  . OSA on CPAP dx'd ~ 09/2016  . Persistent atrial fibrillation   . Pneumonia    "X2" (02/05/2017)  . Pure hypercholesterolemia    Past Surgical History:  Procedure Laterality Date  . ANTERIOR CERVICAL DECOMP/DISCECTOMY FUSION  04/2005   "used piece from my hip"  . CARDIOVERSION N/A 01/14/2017    Procedure: CARDIOVERSION;  Surgeon: Jerline Pain, MD;  Location: [facility] ENDOSCOPY;  Service: Cardiovascular;  Laterality: N/A;  . CARDIOVERSION N/A 02/07/2017   Procedure: CARDIOVERSION;  Surgeon: Skeet Latch, MD;  Location: [facility] ENDOSCOPY;  Service: Cardiovascular;  Laterality: N/A;  . CARDIOVERSION N/A 02/10/2018   Procedure: CARDIOVERSION;  Surgeon: Sanda Klein, MD;  Location: [facility] ENDOSCOPY;  Service: Cardiovascular;  Laterality: N/A;  . COLONOSCOPY W/ BIOPSIES AND POLYPECTOMY  ~ 2012   "benign"  . ELBOW SURGERY Right    "Lyons surgery"  . EXTRACORPOREAL SHOCK WAVE LITHOTRIPSY  1990s  . INGUINAL HERNIA REPAIR Right 1980  . JOINT REPLACEMENT     left knee  . KNEE ARTHROSCOPY Right ~ 1990  . SHOULDER ARTHROSCOPY W/ ROTATOR CUFF REPAIR Right 2000s X 2  . TONSILLECTOMY     "I think I did"  . TOTAL HIP ARTHROPLASTY Left 06/04/2017   Procedure: LEFT TOTAL HIP ARTHROPLASTY ANTERIOR APPROACH;  Surgeon: Paralee Cancel, MD;  Location: [facility] ORS;  Service: Orthopedics;  Laterality: Left;  . TOTAL KNEE ARTHROPLASTY Left 02/07/2015   Procedure: LEFT TOTAL KNEE ARTHROPLASTY;  Surgeon: Paralee Cancel, MD;  Location: [facility] ORS;  Service: Orthopedics;  Laterality: Left;    Current Outpatient Medications  Medication Sig Dispense Refill  . albuterol (PROVENTIL HFA;VENTOLIN HFA) 108 (90 Base) MCG/ACT inhaler Inhale 1-2 puffs into the lungs as needed for wheezing or shortness of breath.     Marland Kitchen atorvastatin (LIPITOR) 40 MG tablet Take 40 mg by mouth every evening.    . dofetilide (TIKOSYN) 500 MCG capsule TAKE 1 CAPSULE BY MOUTH TWICE A DAY 60 capsule 1  . ELIQUIS 5 MG TABS tablet TAKE 1 TABLET TWICE A DAY 180 tablet 2  . EPIPEN 2-PAK 0.3 MG/0.3ML SOAJ injection Inject 0.3 mg into the muscle daily as needed for anaphylaxis.  3  . famotidine (PEPCID) 40 MG tablet Take 40 mg by mouth 2 (two) times daily.    . fluticasone (FLONASE) 50 MCG/ACT nasal spray Place 1 spray into both nostrils daily as needed for  allergies.    Marland Kitchen KLOR-CON M20 20 MEQ tablet TAKE 1 TABLET TWICE A DAY 180 tablet 3  . methocarbamol (ROBAXIN) 500 MG tablet Take 1 tablet (500 mg total) by mouth every 6 (six) hours as needed for muscle spasms. (Patient taking differently: Take 500 mg by mouth as needed for muscle spasms. ) 40 tablet 0  . metoprolol tartrate (LOPRESSOR) 25 MG tablet Take 37.5 mg by mouth 2 (two) times daily. Takes 50mg  twice daily x 4 days    . nystatin (MYCOSTATIN/NYSTOP) powder as needed.     Marland Kitchen spironolactone (ALDACTONE) 25 MG tablet TAKE 1 TABLET DAILY (DOSE  INCREASE) 90 tablet 3  . tadalafil (ADCIRCA/CIALIS) 20 MG tablet Take 40-100  mg by mouth daily as needed for erectile dysfunction.    . gabapentin (NEURONTIN) 100 MG capsule Take 1 capsule (100 mg total) by mouth 3 (three) times daily. (Patient not taking: Reported on 01/26/2019) 60 capsule 0  . losartan (COZAAR) 100 MG tablet Take 100 mg by mouth daily.     . NON FORMULARY Duexes- ibuprofen with pepcid coating  As needed     No current facility-administered medications for this encounter.     Allergies  Allergen Reactions  . Contrast Media [Iodinated Diagnostic Agents] Shortness Of Breath and Palpitations    Broke out in sweats.  . Penicillins Anaphylaxis    Has patient had a PCN reaction causing immediate rash, facial/tongue/throat swelling, SOB or lightheadedness with hypotension: Yes Has patient had a PCN reaction causing severe rash involving mucus membranes or skin necrosis: No Has patient had a PCN reaction that required hospitalization:Yes Has patient had a PCN reaction occurring within the last 10 years: No If all of the above answers are "NO", then may proceed with Cephalosporin use.   Marland Kitchen Singulair [Montelukast Sodium] Swelling    Lips, mouth, and hand swelling     Social History   Socioeconomic History  . Marital status: Married    Spouse name: Not on file  . Number of children: Not on file  . Years of education: Not on file  .  Highest education level: Not on file  Occupational History  . Not on file  Social Needs  . Financial resource strain: Not on file  . Food insecurity    Worry: Not on file    Inability: Not on file  . Transportation needs    Medical: Not on file    Non-medical: Not on file  Tobacco Use  . Smoking status: Never Smoker  . Smokeless tobacco: Never Used  Substance and Sexual Activity  . Alcohol use: Yes    Comment: 02/05/2017 "2-4 glasses of wine/month"  . Drug use: No  . Sexual activity: Yes  Lifestyle  . Physical activity    Days per week: Not on file    Minutes per session: Not on file  . Stress: Not on file  Relationships  . Social Musician on phone: Not on file    Gets together: Not on file    Attends religious service: Not on file    Active member of club or organization: Not on file    Attends meetings of clubs or organizations: Not on file    Relationship status: Not on file  . Intimate partner violence    Fear of current or ex partner: Not on file    Emotionally abused: Not on file    Physically abused: Not on file    Forced sexual activity: Not on file  Other Topics Concern  . Not on file  Social History Narrative   Pt lives in [place].  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    Family History  Problem Relation Age of Onset  . Hypertension Mother   . Hyperlipidemia Mother   . Breast cancer Mother   . Uterine cancer Mother   . Sleep apnea Mother   . Hypertension Brother   . Hyperlipidemia Brother   . Cancer Brother     ROS- All systems are reviewed and negative except as per the HPI above  Physical Exam: There were no vitals filed for this visit. Wt Readings from Last 3 Encounters:  05/30/18  134.7 kg  02/24/18 132.9 kg  02/05/18 132.9 kg    Labs: Lab Results  Component Value Date   NA 140 05/30/2018   K 4.0 05/30/2018   CL 107 05/30/2018   CO2 23 05/30/2018   GLUCOSE 101 (H) 05/30/2018   BUN 14  05/30/2018   CREATININE 1.12 05/30/2018   CALCIUM 9.2 05/30/2018   MG 2.0 05/30/2018   Lab Results  Component Value Date   INR 1.1 12/31/2016   No results found for: CHOL, HDL, LDLCALC, TRIG   GEN- The patient is well appearing, alert and oriented x 3 today.   Head- normocephalic, atraumatic Eyes-  Sclera clear, conjunctiva pink Ears- hearing intact Oropharynx- clear Neck- supple, no JVP Lymph- no cervical lymphadenopathy Lungs- Clear to ausculation bilaterally, normal work of breathing Heart- regular rate and rhythm, no murmurs, rubs or gallops, PMI not laterally displaced GI- soft, NT, ND, + BS Extremities- no clubbing, cyanosis, or edema MS- no significant deformity or atrophy Skin- no rash or lesion Psych- euthymic mood, full affect Neuro- strength and sensation are intact  EKG- afib at 94 bpm,qrs int 98 ms, qtc 510 ms Epic records reviewed Echo-Study Conclusions  - Left ventricle: The cavity size was normal. There was mild   concentric hypertrophy. Systolic function was normal. The   estimated ejection fraction was in the range of 60% to 65%. Wall   motion was normal; there were no regional wall motion   abnormalities. - Aortic valve: Transvalvular velocity was within the normal range.   There was no stenosis. There was no regurgitation. - Aorta: Ascending aortic diameter: 38 mm (S). - Ascending aorta: The ascending aorta was mildly dilated. - Mitral valve: Transvalvular velocity was within the normal range.   There was no evidence for stenosis. There was trivial   regurgitation. - Left atrium: The atrium was severely dilated. - Right ventricle: The cavity size was normal. Wall thickness was   normal. Systolic function was normal. - Right atrium: The atrium was severely dilated. - Tricuspid valve: There was trivial regurgitation.   Assessment and Plan: 1. Persistent symptomatic afib   Had maintained  SR on dofetilide 500 mcg bid with qt stable until last  week Last cardioversion one year ago  Continue CPAP Continue  metoprolol without change at 37.5 mg bid Continue eliquis 5 mg bid  for chadsvasc score of 1, no missed doses x 3 weeks  Bmet/mag/cbc/covid test   2. HTN Well controlled  Continue  spironolactone 25 mg qd, losartan 100 mg qd  F/u in afib clinic one week after cardioversion He would like to be scheduled for a stress test after resuming SR 2/2 brother's recent history of sudden death from  MI   Lupita LeashDonna C. Matthew Folksarroll, ANP-C [facility] [address] [place], [place] [address] [phone]

## 2019-01-26 NOTE — Patient Instructions (Signed)
Cardioversion scheduled for Monday, October 12th  - Arrive at the [facility] and go to admitting at 8:30AM  -Do not eat or drink anything after midnight the night prior to your procedure.  - Take all your morning medication with a sip of water prior to arrival.  - You will not be able to drive home after your procedure.

## 2019-01-27 NOTE — Progress Notes (Signed)
Thanks, we will schedule him for a stress test after the cardioversion. I would wait about a month.

## 2019-01-29 ENCOUNTER — Other Ambulatory Visit (HOSPITAL_COMMUNITY)
Admission: RE | Admit: 2019-01-29 | Discharge: 2019-01-29 | Disposition: A | Discharge status: Home / Self Care | Payer: Managed Care, Other (non HMO) | Source: Ambulatory Visit | Attending: Cardiovascular Disease | Admitting: Cardiovascular Disease

## 2019-01-29 DIAGNOSIS — Z01812 Encounter for preprocedural laboratory examination: Secondary | ICD-10-CM | POA: Insufficient documentation

## 2019-01-29 DIAGNOSIS — Z20828 Contact with and (suspected) exposure to other viral communicable diseases: Secondary | ICD-10-CM | POA: Insufficient documentation

## 2019-01-30 LAB — NOVEL CORONAVIRUS, NAA (HOSP ORDER, SEND-OUT TO REF LAB; TAT 18-24 HRS): SARS-CoV-2, NAA: NOT DETECTED

## 2019-01-30 NOTE — Progress Notes (Signed)
Pre op phone call complete for procedure on Monday 10/12 in ENDO. Patient states they have remained quarantined since COVID test and will continue to through the weekend. All questions addressed.  

## 2019-02-02 ENCOUNTER — Ambulatory Visit (HOSPITAL_COMMUNITY): Payer: Managed Care, Other (non HMO) | Admitting: Certified Registered Nurse Anesthetist

## 2019-02-02 ENCOUNTER — Encounter (HOSPITAL_COMMUNITY)
Admission: RE | Disposition: A | Discharge status: Home / Self Care | Payer: Self-pay | Source: Home / Self Care | Attending: Cardiovascular Disease

## 2019-02-02 ENCOUNTER — Ambulatory Visit (HOSPITAL_COMMUNITY)
Admission: RE | Admit: 2019-02-02 | Discharge: 2019-02-02 | Disposition: A | Discharge status: Home / Self Care | Payer: Managed Care, Other (non HMO) | Attending: Cardiovascular Disease | Admitting: Cardiovascular Disease

## 2019-02-02 ENCOUNTER — Encounter (HOSPITAL_COMMUNITY): Payer: Self-pay

## 2019-02-02 ENCOUNTER — Other Ambulatory Visit: Payer: Self-pay

## 2019-02-02 DIAGNOSIS — E669 Obesity, unspecified: Secondary | ICD-10-CM | POA: Insufficient documentation

## 2019-02-02 DIAGNOSIS — E7800 Pure hypercholesterolemia, unspecified: Secondary | ICD-10-CM | POA: Diagnosis not present

## 2019-02-02 DIAGNOSIS — G4733 Obstructive sleep apnea (adult) (pediatric): Secondary | ICD-10-CM | POA: Insufficient documentation

## 2019-02-02 DIAGNOSIS — I4819 Other persistent atrial fibrillation: Secondary | ICD-10-CM | POA: Diagnosis present

## 2019-02-02 DIAGNOSIS — I129 Hypertensive chronic kidney disease with stage 1 through stage 4 chronic kidney disease, or unspecified chronic kidney disease: Secondary | ICD-10-CM | POA: Diagnosis not present

## 2019-02-02 DIAGNOSIS — Z6838 Body mass index (BMI) 38.0-38.9, adult: Secondary | ICD-10-CM | POA: Insufficient documentation

## 2019-02-02 DIAGNOSIS — Z7901 Long term (current) use of anticoagulants: Secondary | ICD-10-CM | POA: Insufficient documentation

## 2019-02-02 DIAGNOSIS — M1990 Unspecified osteoarthritis, unspecified site: Secondary | ICD-10-CM | POA: Insufficient documentation

## 2019-02-02 DIAGNOSIS — N182 Chronic kidney disease, stage 2 (mild): Secondary | ICD-10-CM | POA: Diagnosis not present

## 2019-02-02 DIAGNOSIS — Z79899 Other long term (current) drug therapy: Secondary | ICD-10-CM | POA: Diagnosis not present

## 2019-02-02 DIAGNOSIS — I4891 Unspecified atrial fibrillation: Secondary | ICD-10-CM | POA: Diagnosis not present

## 2019-02-02 HISTORY — PX: CARDIOVERSION: SHX1299

## 2019-02-02 SURGERY — CARDIOVERSION
Anesthesia: General

## 2019-02-02 MED ORDER — SODIUM CHLORIDE 0.9 % IV SOLN
INTRAVENOUS | Status: DC | PRN
  Administered 2019-02-02: 10:00:00 via INTRAVENOUS

## 2019-02-02 MED ORDER — LIDOCAINE 2% (20 MG/ML) 5 ML SYRINGE
INTRAMUSCULAR | Status: DC | PRN
  Administered 2019-02-02: 80 mg via INTRAVENOUS

## 2019-02-02 MED ORDER — PROPOFOL 10 MG/ML IV BOLUS
INTRAVENOUS | Status: DC | PRN
  Administered 2019-02-02: 100 mg via INTRAVENOUS

## 2019-02-02 NOTE — Discharge Instructions (Signed)

## 2019-02-02 NOTE — CV Procedure (Signed)
   Cardioversion Note  Garrett Hanson [id number] 03-02-1961  Procedure: DC Cardioversion Indications: atrial fibrillation  Procedure Details Consent: Obtained Time Out: Verified patient identification, verified procedure, site/side was marked, verified correct patient position, special equipment/implants available, Radiology Safety Procedures followed,  medications/allergies/relevent history reviewed, required imaging and test results available.  Performed  The patient has been on adequate anticoagulation.  The patient received IV propofol 100 mg and IV Lidocaine 80 mg for sedation.  Synchronous cardioversion was performed at 120 joules.  The cardioversion was successful.  Complications: No apparent complications Patient did tolerate procedure well.  [name], MD, [facility] 02/02/2019, 9:42 AM

## 2019-02-02 NOTE — Anesthesia Preprocedure Evaluation (Signed)
Anesthesia Evaluation  Patient identified by MRN, date of birth, ID band Patient awake    Reviewed: Allergy & Precautions, NPO status , Patient's Chart, lab work & pertinent test results  Airway Mallampati: II  TM Distance: >3 FB Neck ROM: Full    Dental no notable dental hx.    Pulmonary sleep apnea ,    Pulmonary exam normal breath sounds clear to auscultation       Cardiovascular hypertension, + dysrhythmias Atrial Fibrillation  Rhythm:Irregular Rate:Normal     Neuro/Psych negative neurological ROS  negative psych ROS   GI/Hepatic negative GI ROS, Neg liver ROS,   Endo/Other  Morbid obesity  Renal/GU negative Renal ROS  negative genitourinary   Musculoskeletal negative musculoskeletal ROS (+)   Abdominal   Peds negative pediatric ROS (+)  Hematology negative hematology ROS (+)   Anesthesia Other Findings   Reproductive/Obstetrics negative OB ROS                             Anesthesia Physical Anesthesia Plan  ASA: III  Anesthesia Plan: General   Post-op Pain Management:    Induction: Intravenous  PONV Risk Score and Plan: 0  Airway Management Planned: Mask  Additional Equipment:   Intra-op Plan:   Post-operative Plan: Extubation in OR  Informed Consent: I have reviewed the patients History and Physical, chart, labs and discussed the procedure including the risks, benefits and alternatives for the proposed anesthesia with the patient or authorized representative who has indicated his/her understanding and acceptance.     Dental advisory given  Plan Discussed with: CRNA and Surgeon  Anesthesia Plan Comments:         Anesthesia Quick Evaluation

## 2019-02-02 NOTE — Anesthesia Procedure Notes (Addendum)
Date/Time: 02/02/2019 9:36 AM Performed by: Janene Harvey, CRNA Pre-anesthesia Checklist: Patient identified, Emergency Drugs available, Suction available and Patient being monitored Oxygen Delivery Method: Ambu bag Dental Injury: Teeth and Oropharynx as per pre-operative assessment

## 2019-02-02 NOTE — Transfer of Care (Signed)
Immediate Anesthesia Transfer of Care Note  Patient: Garrett Hanson  Procedure(s) Performed: CARDIOVERSION (N/A )  Patient Location: Endoscopy Unit  Anesthesia Type:General  Level of Consciousness: awake  Airway & Oxygen Therapy: Patient Spontanous Breathing  Post-op Assessment: Report given to RN and Post -op Vital signs reviewed and stable  Post vital signs: Reviewed  Last Vitals:  Vitals Value Taken Time  BP    Temp    Pulse    Resp    SpO2      Last Pain:  Vitals:   02/02/19 0856  TempSrc: Oral  PainSc: 0-No pain         Complications: No apparent anesthesia complications

## 2019-02-02 NOTE — Interval H&P Note (Signed)
History and Physical Interval Note:  02/02/2019 9:42 AM  Garrett Hanson  has presented today for surgery, with the diagnosis of AFIB.  The various methods of treatment have been discussed with the patient and family. After consideration of risks, benefits and other options for treatment, the patient has consented to  Procedure(s): CARDIOVERSION (N/A) as a surgical intervention.  The patient's history has been reviewed, patient examined, no change in status, stable for surgery.  I have reviewed the patient's chart and labs.  Questions were answered to the patient's satisfaction.     [name]

## 2019-02-02 NOTE — Anesthesia Postprocedure Evaluation (Signed)
Anesthesia Post Note  Patient: Garrett Hanson  Procedure(s) Performed: CARDIOVERSION (N/A )     Patient location during evaluation: PACU Anesthesia Type: General Level of consciousness: awake and alert Pain management: pain level controlled Vital Signs Assessment: post-procedure vital signs reviewed and stable Respiratory status: spontaneous breathing, nonlabored ventilation, respiratory function stable and patient connected to nasal cannula oxygen Cardiovascular status: blood pressure returned to baseline and stable Postop Assessment: no apparent nausea or vomiting Anesthetic complications: no    Last Vitals:  Vitals:   02/02/19 0946 02/02/19 0956  BP: 117/69 (!) 123/91  Pulse: (!) 59 60  Resp: 15 19  Temp: 36.8 C   SpO2: 97% 95%    Last Pain:  Vitals:   02/02/19 0956  TempSrc:   PainSc: 0-No pain                 Garrett Hanson S

## 2019-02-03 ENCOUNTER — Encounter (HOSPITAL_COMMUNITY): Payer: Self-pay | Admitting: Cardiology

## 2019-02-08 ENCOUNTER — Other Ambulatory Visit: Payer: Self-pay | Admitting: Nurse Practitioner

## 2019-02-09 ENCOUNTER — Encounter (HOSPITAL_COMMUNITY): Payer: Self-pay | Admitting: Nurse Practitioner

## 2019-02-09 ENCOUNTER — Other Ambulatory Visit: Payer: Self-pay

## 2019-02-09 ENCOUNTER — Ambulatory Visit (HOSPITAL_COMMUNITY)
Admission: RE | Admit: 2019-02-09 | Discharge: 2019-02-09 | Disposition: A | Discharge status: Home / Self Care | Payer: Managed Care, Other (non HMO) | Source: Ambulatory Visit | Attending: Nurse Practitioner | Admitting: Nurse Practitioner

## 2019-02-09 VITALS — BP 162/90 | HR 51 | Ht 74.0 in | Wt 296.8 lb

## 2019-02-09 DIAGNOSIS — Z96652 Presence of left artificial knee joint: Secondary | ICD-10-CM | POA: Diagnosis not present

## 2019-02-09 DIAGNOSIS — E7800 Pure hypercholesterolemia, unspecified: Secondary | ICD-10-CM | POA: Insufficient documentation

## 2019-02-09 DIAGNOSIS — Z803 Family history of malignant neoplasm of breast: Secondary | ICD-10-CM | POA: Diagnosis not present

## 2019-02-09 DIAGNOSIS — I129 Hypertensive chronic kidney disease with stage 1 through stage 4 chronic kidney disease, or unspecified chronic kidney disease: Secondary | ICD-10-CM | POA: Diagnosis not present

## 2019-02-09 DIAGNOSIS — Z8049 Family history of malignant neoplasm of other genital organs: Secondary | ICD-10-CM | POA: Insufficient documentation

## 2019-02-09 DIAGNOSIS — Z96642 Presence of left artificial hip joint: Secondary | ICD-10-CM | POA: Diagnosis not present

## 2019-02-09 DIAGNOSIS — G4733 Obstructive sleep apnea (adult) (pediatric): Secondary | ICD-10-CM | POA: Diagnosis not present

## 2019-02-09 DIAGNOSIS — I4819 Other persistent atrial fibrillation: Secondary | ICD-10-CM | POA: Insufficient documentation

## 2019-02-09 DIAGNOSIS — M1712 Unilateral primary osteoarthritis, left knee: Secondary | ICD-10-CM | POA: Diagnosis not present

## 2019-02-09 DIAGNOSIS — Z79899 Other long term (current) drug therapy: Secondary | ICD-10-CM | POA: Diagnosis not present

## 2019-02-09 DIAGNOSIS — Z809 Family history of malignant neoplasm, unspecified: Secondary | ICD-10-CM | POA: Insufficient documentation

## 2019-02-09 DIAGNOSIS — Z8249 Family history of ischemic heart disease and other diseases of the circulatory system: Secondary | ICD-10-CM | POA: Insufficient documentation

## 2019-02-09 DIAGNOSIS — E669 Obesity, unspecified: Secondary | ICD-10-CM | POA: Insufficient documentation

## 2019-02-09 DIAGNOSIS — Z7901 Long term (current) use of anticoagulants: Secondary | ICD-10-CM | POA: Insufficient documentation

## 2019-02-09 DIAGNOSIS — M1612 Unilateral primary osteoarthritis, left hip: Secondary | ICD-10-CM | POA: Insufficient documentation

## 2019-02-09 DIAGNOSIS — Z91041 Radiographic dye allergy status: Secondary | ICD-10-CM | POA: Insufficient documentation

## 2019-02-09 DIAGNOSIS — Z880 Allergy status to penicillin: Secondary | ICD-10-CM | POA: Diagnosis not present

## 2019-02-09 DIAGNOSIS — N182 Chronic kidney disease, stage 2 (mild): Secondary | ICD-10-CM | POA: Diagnosis not present

## 2019-02-09 DIAGNOSIS — R001 Bradycardia, unspecified: Secondary | ICD-10-CM | POA: Diagnosis not present

## 2019-02-09 DIAGNOSIS — Z6838 Body mass index (BMI) 38.0-38.9, adult: Secondary | ICD-10-CM | POA: Insufficient documentation

## 2019-02-09 NOTE — Progress Notes (Signed)
Primary Care Physician: Daisy Floro, MD Referring Physician: [facility] Cardiologist: Dr. Merril Abbe is a 58 y.o. male with a h/o HTN, obesity, recently diagnosed sleep apnea, started CPAP 4 weeks ago. He was found to be in rate controlled afib at time of physical mid summer and was sent to Cardiology. He was set up for cardioversion, 9/24,  and unfortunately would not shock out. He came into  the afib clinic for other options.  He reports that 20 years ago he had a couple of episodes of rapid heart rate and possibly Dr. Graciela Husbands, can't for sure remember the name, did cardiac mapping, but no ablation was done and since this was a very infrequent thing, no other treatment was indicated at the time. Now, pt thinks he could have been in afib for a year, as it was not present on his last physical exam. He may have had some intermittent episodes before that. He went to [place] in May and was more short of breath when they were out exploring the area. Echo showed severe enlargement of left and right atrium, 47 mm with volume of 93.  He  was more symptomatic with fatigue, dyspnea, hard to do his usual activities.  F/u in afib clinic 10/26, f/u tikosyn start, he is staying in SR, feels some better, but he has just been in SR x one week and has just been using his CPAP for a few weeks, so he anticipates that he will be feeling better with time. He is very serious that he wants to lose weight and become more active.  F/u in afib clinic 12/7, one month after tikosyn start. He is staying in SR. Bp has improved on spironolactone and K+ levels improved. He feels well. Being complaint with CPAP and Tikosyn, apixaban.  F/u with afib clinic, 8/8. He went back into afib yesterday. He does not  know of any trigger. Continues on Tikosyn and eliquis, no missed doses. He has noted some low BP readings at home. Feels sluggish in afib.  He is back in afib clinic, 10/15. After visit on 8/8, pt  did spontaneously revert to SR and did not require cardioversion. However, he has been out of rhythm in rate controlled afib  X 2 weeks and will pursue cardioversion. He does not know any definite trigger.  F/u in clinic after cardioversion 11/4 after successful cardioversion.He has not noted any further afib.   F/u in afib clinic, 05/30/18. He reports that he is doing well staying in rhythm, compliant with Tikosyn.   F/u in afib clinic 01/26/19 for return of afib last week. He feels the stressor for afib is that his brother had sudden death in the last few weeks presumed to be from an MI.His mother was then in the hospital for chest pain but no MI. He continues on Tikosyn. He would prefer a cardioversion instead of proceeding to ablation. No missed anticoagulation.   F/u in afib clinic 10/19. He had successful DCCV and is reaming in SR. He still is  pending  an appointment with Dr. Salena Saner or his team for further evaluation of possible CAD with brother's recent sudden death thought 2/2 CAD.   Today, he denies symptoms of palpitations, chest pain,   orthopnea, PND, lower extremity edema, dizziness, presyncope, syncope, or neurologic sequela.  The patient is tolerating medications without difficulties and is otherwise without complaint today.   Past Medical History:  Diagnosis Date  . Arthritis    "  left knee; probably in the shoulders; left hip" (02/05/2017)  . Chronic bronchitis (HCC)    "get it ~ q other year" (02/05/2017)  . Chronic urticaria   . CKD (chronic kidney disease), stage II    pt denies this hx on 02/05/2017  . Complication of anesthesia    pt states awaken during colonscopy   . Dysrhythmia    a fib  . Essential hypertension   . History of blood transfusion 1961  . History of common duodenal ulcer 1980  . History of kidney stones   . Lymphadenopathy    a. evaluated by heme-onc 04/2016, under surveillance.  . OSA on CPAP dx'd ~ 09/2016  . Persistent atrial fibrillation (HCC)   .  Pneumonia    "X2" (02/05/2017)  . Pure hypercholesterolemia    Past Surgical History:  Procedure Laterality Date  . ANTERIOR CERVICAL DECOMP/DISCECTOMY FUSION  04/2005   "used piece from my hip"  . CARDIOVERSION N/A 01/14/2017   Procedure: CARDIOVERSION;  Surgeon: Jake BatheSkains, Kaizen C, MD;  [facility];  Service: Cardiovascular;  Laterality: N/A;  . CARDIOVERSION N/A 02/07/2017   Procedure: CARDIOVERSION;  Surgeon: Chilton Siandolph, Tiffany, MD;  [facility];  Service: Cardiovascular;  Laterality: N/A;  . CARDIOVERSION N/A 02/10/2018   Procedure: CARDIOVERSION;  Surgeon: Thurmon Fairroitoru, Mihai, MD;  Location: [facility] ENDOSCOPY;  Service: Cardiovascular;  Laterality: N/A;  . CARDIOVERSION N/A 02/02/2019   Procedure: CARDIOVERSION;  Surgeon: Lars MassonNelson, Katarina H, MD;  [facility];  Service: Cardiovascular;  Laterality: N/A;  . COLONOSCOPY W/ BIOPSIES AND POLYPECTOMY  ~ 2012   "benign"  . ELBOW SURGERY Right    "Tommy John surgery"  . EXTRACORPOREAL SHOCK WAVE LITHOTRIPSY  1990s  . INGUINAL HERNIA REPAIR Right 1980  . JOINT REPLACEMENT     left knee  . KNEE ARTHROSCOPY Right ~ 1990  . SHOULDER ARTHROSCOPY W/ ROTATOR CUFF REPAIR Right 2000s X 2  . TONSILLECTOMY     "I think I did"  . TOTAL HIP ARTHROPLASTY Left 06/04/2017   Procedure: LEFT TOTAL HIP ARTHROPLASTY ANTERIOR APPROACH;  Surgeon: Durene Romanslin, Matthew, MD;  Location: [facility] ORS;  Service: Orthopedics;  Laterality: Left;  . TOTAL KNEE ARTHROPLASTY Left 02/07/2015   Procedure: LEFT TOTAL KNEE ARTHROPLASTY;  Surgeon: Durene RomansMatthew Olin, MD;  Location: [facility] ORS;  Service: Orthopedics;  Laterality: Left;    Current Outpatient Medications  Medication Sig Dispense Refill  . albuterol (PROVENTIL HFA;VENTOLIN HFA) 108 (90 Base) MCG/ACT inhaler Inhale 1-2 puffs into the lungs as needed for wheezing or shortness of breath.     Marland Kitchen. atorvastatin (LIPITOR) 40 MG tablet Take 40 mg by mouth every evening.    . dofetilide (TIKOSYN) 500 MCG capsule TAKE 1  CAPSULE BY MOUTH TWICE A DAY 60 capsule 6  . ELIQUIS 5 MG TABS tablet TAKE 1 TABLET TWICE A DAY 180 tablet 2  . EPIPEN 2-PAK 0.3 MG/0.3ML SOAJ injection Inject 0.3 mg into the muscle daily as needed for anaphylaxis.  3  . famotidine (PEPCID) 40 MG tablet Take 40 mg by mouth 2 (two) times daily.    . fluticasone (FLONASE) 50 MCG/ACT nasal spray Place 1 spray into both nostrils daily as needed for allergies.    Marland Kitchen. KLOR-CON M20 20 MEQ tablet TAKE 1 TABLET TWICE A DAY 180 tablet 3  . losartan (COZAAR) 100 MG tablet Take 100 mg by mouth daily.     . methocarbamol (ROBAXIN) 500 MG tablet Take 1 tablet (500 mg total) by mouth every 6 (six) hours as  needed for muscle spasms. 40 tablet 0  . metoprolol tartrate (LOPRESSOR) 25 MG tablet Take 37.5 mg by mouth 2 (two) times daily. Taking 37.5mg  twice daily    . nystatin (MYCOSTATIN/NYSTOP) powder Apply 1 Bottle topically daily as needed (irritation).     Marland Kitchen spironolactone (ALDACTONE) 25 MG tablet TAKE 1 TABLET DAILY (DOSE  INCREASE) (Patient taking differently: Take 25 mg by mouth daily. ) 90 tablet 3  . tadalafil (ADCIRCA/CIALIS) 20 MG tablet Take 40-100 mg by mouth daily as needed for erectile dysfunction.     No current facility-administered medications for this encounter.     Allergies  Allergen Reactions  . Contrast Media [Iodinated Diagnostic Agents] Shortness Of Breath and Palpitations    Broke out in sweats.  . Penicillins Anaphylaxis    Has patient had a PCN reaction causing immediate rash, facial/tongue/throat swelling, SOB or lightheadedness with hypotension: Yes Has patient had a PCN reaction causing severe rash involving mucus membranes or skin necrosis: No Has patient had a PCN reaction that required hospitalization:Yes Has patient had a PCN reaction occurring within the last 10 years: No If all of the above answers are "NO", then may proceed with Cephalosporin use.   Marland Kitchen Singulair [Montelukast Sodium] Swelling    Lips, mouth, and hand  swelling     Social History   Socioeconomic History  . Marital status: Married    Spouse name: Not on file  . Number of children: Not on file  . Years of education: Not on file  . Highest education level: Not on file  Occupational History  . Not on file  Social Needs  . Financial resource strain: Not on file  . Food insecurity    Worry: Not on file    Inability: Not on file  . Transportation needs    Medical: Not on file    Non-medical: Not on file  Tobacco Use  . Smoking status: Never Smoker  . Smokeless tobacco: Never Used  Substance and Sexual Activity  . Alcohol use: Yes    Comment: 02/05/2017 "2-4 glasses of wine/month"  . Drug use: No  . Sexual activity: Yes  Lifestyle  . Physical activity    Days per week: Not on file    Minutes per session: Not on file  . Stress: Not on file  Relationships  . Social Herbalist on phone: Not on file    Gets together: Not on file    Attends religious service: Not on file    Active member of club or organization: Not on file    Attends meetings of clubs or organizations: Not on file    Relationship status: Not on file  . Intimate partner violence    Fear of current or ex partner: Not on file    Emotionally abused: Not on file    Physically abused: Not on file    Forced sexual activity: Not on file  Other Topics Concern  . Not on file  Social History Narrative   Pt lives in [place].  Works as a Biochemist, clinical for [employer].  Married with 4 children.    Family History  Problem Relation Age of Onset  . Hypertension Mother   . Hyperlipidemia Mother   . Breast cancer Mother   . Uterine cancer Mother   . Sleep apnea Mother   . Hypertension Brother   . Hyperlipidemia Brother   . Cancer Brother     ROS- All systems are reviewed and negative except  as per the HPI above  Physical Exam: Vitals:   02/09/19 1522  BP: (!) 162/90  Pulse: (!) 51  Weight: 134.6 kg  Height: 6\' 2"  (1.88 m)   Wt Readings from Last 3  Encounters:  02/09/19 134.6 kg  02/02/19 136 kg  01/26/19 (!) 136.2 kg    Labs: Lab Results  Component Value Date   NA 140 01/26/2019   K 5.0 01/26/2019   CL 105 01/26/2019   CO2 26 01/26/2019   GLUCOSE 116 (H) 01/26/2019   BUN 15 01/26/2019   CREATININE 1.25 (H) 01/26/2019   CALCIUM 9.1 01/26/2019   MG 2.2 01/26/2019   Lab Results  Component Value Date   INR 1.1 12/31/2016   No results found for: CHOL, HDL, LDLCALC, TRIG   GEN- The patient is well appearing, alert and oriented x 3 today.   Head- normocephalic, atraumatic Eyes-  Sclera clear, conjunctiva pink Ears- hearing intact Oropharynx- clear Neck- supple, no JVP Lymph- no cervical lymphadenopathy Lungs- Clear to ausculation bilaterally, normal work of breathing Heart- regular rate and rhythm, no murmurs, rubs or gallops, PMI not laterally displaced GI- soft, NT, ND, + BS Extremities- no clubbing, cyanosis, or edema MS- no significant deformity or atrophy Skin- no rash or lesion Psych- euthymic mood, full affect Neuro- strength and sensation are intact  EKG- Sinus brady at 51 bpm, PR int 180 ms, qrs int 106 ms, qtc 468 ms Epic records reviewed Echo-Study Conclusions  - Left ventricle: The cavity size was normal. There was mild   concentric hypertrophy. Systolic function was normal. The   estimated ejection fraction was in the range of 60% to 65%. Wall   motion was normal; there were no regional wall motion   abnormalities. - Aortic valve: Transvalvular velocity was within the normal range.   There was no stenosis. There was no regurgitation. - Aorta: Ascending aortic diameter: 38 mm (S). - Ascending aorta: The ascending aorta was mildly dilated. - Mitral valve: Transvalvular velocity was within the normal range.   There was no evidence for stenosis. There was trivial   regurgitation. - Left atrium: The atrium was severely dilated. - Right ventricle: The cavity size was normal. Wall thickness was    normal. Systolic function was normal. - Right atrium: The atrium was severely dilated. - Tricuspid valve: There was trivial regurgitation.   Assessment and Plan: 1. Persistent symptomatic afib   Had maintained  SR on dofetilide 500 mcg bid with qt stable for  a year He  opted for repeat DCCV vrs ablation He remains in SR   Continue CPAP Continue  Metoprolol  at 37.5 mg bid Continue eliquis 5 mg bid  for chadsvasc score of 1     2. HTN Elevated on presentation at 162/90 Rechecked by me 140/78  Continue  spironolactone 25 mg qd, losartan 100 mg qd   I have referred back to Dr. 03/02/2017( or care team) for appointment in the next 2-3 weeks. He was scheduled for a visit, and missed his appointment, showing up on wrong day,  to consider most appropriate  stress test/cardiac evaluation  with his brother's  recent history of sudden death from  MI. I am concerned that adenosine could trigger afib  F/u here in afib clinic in 6 months for Tikosyn surveillance   Salena Saner C. Lupita Leash [facility] [address] [place], [place] [id number]

## 2019-02-10 ENCOUNTER — Other Ambulatory Visit: Payer: Self-pay | Admitting: *Deleted

## 2019-02-10 DIAGNOSIS — I4819 Other persistent atrial fibrillation: Secondary | ICD-10-CM

## 2019-02-24 ENCOUNTER — Telehealth (HOSPITAL_COMMUNITY): Payer: Self-pay

## 2019-02-24 NOTE — Telephone Encounter (Signed)
Encounter complete. 

## 2019-02-25 ENCOUNTER — Ambulatory Visit (HOSPITAL_COMMUNITY)
Admission: RE | Admit: 2019-02-25 | Discharge: 2019-02-25 | Disposition: A | Discharge status: Home / Self Care | Payer: Managed Care, Other (non HMO) | Source: Ambulatory Visit | Attending: Cardiovascular Disease | Admitting: Cardiovascular Disease

## 2019-02-25 ENCOUNTER — Other Ambulatory Visit: Payer: Self-pay

## 2019-02-25 DIAGNOSIS — I4819 Other persistent atrial fibrillation: Secondary | ICD-10-CM | POA: Insufficient documentation

## 2019-02-25 MED ORDER — TECHNETIUM TC 99M TETROFOSMIN IV KIT
29.2000 | PACK | Freq: Once | INTRAVENOUS | Status: AC | PRN
  Administered 2019-02-25: 29.2 via INTRAVENOUS
  Filled 2019-02-25: qty 30

## 2019-02-25 MED ORDER — REGADENOSON 0.4 MG/5ML IV SOLN
0.4000 mg | Freq: Once | INTRAVENOUS | Status: AC
  Administered 2019-02-25: 0.4 mg via INTRAVENOUS

## 2019-02-26 ENCOUNTER — Ambulatory Visit (HOSPITAL_COMMUNITY)
Admission: RE | Admit: 2019-02-26 | Discharge: 2019-02-26 | Disposition: A | Discharge status: Home / Self Care | Payer: Managed Care, Other (non HMO) | Source: Ambulatory Visit | Attending: Cardiology | Admitting: Cardiology

## 2019-02-26 LAB — MYOCARDIAL PERFUSION IMAGING
LV dias vol: 131 mL (ref 62–150)
LV sys vol: 54 mL
Peak HR: 69 {beats}/min
Rest HR: 59 {beats}/min
SDS: 0
SRS: 1
SSS: 1
TID: 0.92

## 2019-02-26 MED ORDER — TECHNETIUM TC 99M TETROFOSMIN IV KIT
31.4000 | PACK | Freq: Once | INTRAVENOUS | Status: AC | PRN
  Administered 2019-02-26: 31.4 via INTRAVENOUS

## 2019-03-01 NOTE — Progress Notes (Signed)
Virtual Visit via Video Note   This visit type was conducted due to national recommendations for restrictions regarding the COVID-19 Pandemic (e.g. social distancing) in an effort to limit this patient's exposure and mitigate transmission in our community.  Due to his co-morbid illnesses, this patient is at least at moderate risk for complications without adequate follow up.  This format is felt to be most appropriate for this patient at this time.  All issues noted in this document were discussed and addressed.  A limited physical exam was performed with this format.  Please refer to the patient's chart for his consent to telehealth for [facility].   Date:  03/02/2019   ID:  Garrett Hanson, DOB Nov 27, 1960, MRN [id number]  Patient Location: Home Provider Location: Home  PCP:  Daisy Floro, MD  Cardiologist:  Thurmon Fair, MD  Electrophysiologist:  None   Evaluation Performed:  Follow-Up Visit  Chief Complaint:  Follow up stress test  History of Present Illness:    Garrett Hanson is a 58 y.o. male with paroxysmal Afib on tikosyn, lopressor, and eliquis, hypertension, OSA on CPAP, HLD, obesity,and CKD stage III. He has followed in Afib clinic and had a history of an unsuccessful cardioversion. He was then started on tikosyn, but reverted back to Afib. He had a successful cardioversion 02/24/18 and continued on tikosyn. Unfortunately, his brother died suddenly, presumed to be from MI, and her reverted back to Afib. He was successfully cardioverted on 02/02/19. He presented back to Afib clinic on 02/09/19 and was still in sinus rhythm. He presents to general cardiology today to discuss possible stress test.  Mother is hospitalized in [facility] for heart failure. Brother age 59 yo died of suspected MI. Has had hip and knee replacement over the past four years. He has a home gym, but hasn't been using it because he is nervous about heart disease. Risk factors include obesity, HTN, HLD, and  possible family history of early heart disease. He is not diabetic and does not smoke. He underwent nuclear stress test 02/26/19 which was read as low risk without ischemia and normal EF. He denies any anginal symptoms. He does report occasional palpitations, unclear if these are PAF or PVC/PACs. He believes he is in sinus rhythm this morning. His sister obtained a Ca score after the death of their brother and he questions if he needs a Ca score also. I cleared him to exercise without restrictions, but to start slowly as to not injure his knee or hip.    The patient does not have symptoms concerning for COVID-19 infection (fever, chills, cough, or new shortness of breath).    Past Medical History:  Diagnosis Date  . Arthritis    "left knee; probably in the shoulders; left hip" (02/05/2017)  . Chronic bronchitis (HCC)    "get it ~ q other year" (02/05/2017)  . Chronic urticaria   . CKD (chronic kidney disease), stage II    pt denies this hx on 02/05/2017  . Complication of anesthesia    pt states awaken during colonscopy   . Dysrhythmia    a fib  . Essential hypertension   . History of blood transfusion 1961  . History of common duodenal ulcer 1980  . History of kidney stones   . Lymphadenopathy    a. evaluated by heme-onc 04/2016, under surveillance.  . OSA on CPAP dx'd ~ 09/2016  . Persistent atrial fibrillation (HCC)   . Pneumonia    "X2" (02/05/2017)  .  Pure hypercholesterolemia    Past Surgical History:  Procedure Laterality Date  . ANTERIOR CERVICAL DECOMP/DISCECTOMY FUSION  04/2005   "used piece from my hip"  . CARDIOVERSION N/A 01/14/2017   Procedure: CARDIOVERSION;  Surgeon: Jake Bathe, MD;  Location: [facility] ENDOSCOPY;  Service: Cardiovascular;  Laterality: N/A;  . CARDIOVERSION N/A 02/07/2017   Procedure: CARDIOVERSION;  Surgeon: Chilton Si, MD;  Location: [facility] ENDOSCOPY;  Service: Cardiovascular;  Laterality: N/A;  . CARDIOVERSION N/A 02/10/2018   Procedure:  CARDIOVERSION;  Surgeon: Thurmon Fair, MD;  Location: [facility] ENDOSCOPY;  Service: Cardiovascular;  Laterality: N/A;  . CARDIOVERSION N/A 02/02/2019   Procedure: CARDIOVERSION;  Surgeon: Lars Masson, MD;  Location: [facility] ENDOSCOPY;  Service: Cardiovascular;  Laterality: N/A;  . COLONOSCOPY W/ BIOPSIES AND POLYPECTOMY  ~ 2012   "benign"  . ELBOW SURGERY Right    "Tommy John surgery"  . EXTRACORPOREAL SHOCK WAVE LITHOTRIPSY  1990s  . INGUINAL HERNIA REPAIR Right 1980  . JOINT REPLACEMENT     left knee  . KNEE ARTHROSCOPY Right ~ 1990  . SHOULDER ARTHROSCOPY W/ ROTATOR CUFF REPAIR Right 2000s X 2  . TONSILLECTOMY     "I think I did"  . TOTAL HIP ARTHROPLASTY Left 06/04/2017   Procedure: LEFT TOTAL HIP ARTHROPLASTY ANTERIOR APPROACH;  Surgeon: Durene Romans, MD;  Location: [facility] ORS;  Service: Orthopedics;  Laterality: Left;  . TOTAL KNEE ARTHROPLASTY Left 02/07/2015   Procedure: LEFT TOTAL KNEE ARTHROPLASTY;  Surgeon: Durene Romans, MD;  Location: [facility] ORS;  Service: Orthopedics;  Laterality: Left;     Current Meds  Medication Sig  . albuterol (PROVENTIL HFA;VENTOLIN HFA) 108 (90 Base) MCG/ACT inhaler Inhale 1-2 puffs into the lungs as needed for wheezing or shortness of breath.   Marland Kitchen atorvastatin (LIPITOR) 40 MG tablet Take 40 mg by mouth every evening.  . dofetilide (TIKOSYN) 500 MCG capsule TAKE 1 CAPSULE BY MOUTH TWICE A DAY  . ELIQUIS 5 MG TABS tablet TAKE 1 TABLET TWICE A DAY  . EPIPEN 2-PAK 0.3 MG/0.3ML SOAJ injection Inject 0.3 mg into the muscle daily as needed for anaphylaxis.  . famotidine (PEPCID) 40 MG tablet Take 40 mg by mouth 2 (two) times daily.  . fluticasone (FLONASE) 50 MCG/ACT nasal spray Place 1 spray into both nostrils daily as needed for allergies.  Marland Kitchen KLOR-CON M20 20 MEQ tablet TAKE 1 TABLET TWICE A DAY  . losartan (COZAAR) 100 MG tablet Take 100 mg by mouth daily.   . methocarbamol (ROBAXIN) 500 MG tablet Take 1 tablet (500 mg total) by mouth every 6 (six) hours as  needed for muscle spasms.  . metoprolol tartrate (LOPRESSOR) 25 MG tablet Take 37.5 mg by mouth 2 (two) times daily. Taking 37.5mg  twice daily  . nystatin (MYCOSTATIN/NYSTOP) powder Apply 1 Bottle topically daily as needed (irritation).   Marland Kitchen spironolactone (ALDACTONE) 25 MG tablet TAKE 1 TABLET DAILY (DOSE  INCREASE) (Patient taking differently: Take 25 mg by mouth daily. )  . tadalafil (ADCIRCA/CIALIS) 20 MG tablet Take 40-100 mg by mouth daily as needed for erectile dysfunction.     Allergies:   Contrast media [iodinated diagnostic agents], Penicillins, and Singulair [montelukast sodium]   Social History   Tobacco Use  . Smoking status: Never Smoker  . Smokeless tobacco: Never Used  Substance Use Topics  . Alcohol use: Yes    Comment: 02/05/2017 "2-4 glasses of wine/month"  . Drug use: No     Family Hx: The patient's family history includes Breast cancer in his  mother; Cancer in his brother; Hyperlipidemia in his brother and mother; Hypertension in his brother and mother; Sleep apnea in his mother; Uterine cancer in his mother.  ROS:   Please see the history of present illness.     All other systems reviewed and are negative.   Prior CV studies:   The following studies were reviewed today:  Nuclear stress test 2020:  Nuclear stress EF: 59%. The left ventricular ejection fraction is normal (55-65%).  There was no ST segment deviation noted during stress.  This is a low risk study.  The study is normal.   Labs/Other Tests and Data Reviewed:    EKG:  No ECG reviewed.  Recent Labs: 01/26/2019: BUN 15; Creatinine, Ser 1.25; Hemoglobin 16.4; Magnesium 2.2; Platelets 245; Potassium 5.0; Sodium 140   Recent Lipid Panel No results found for: CHOL, TRIG, HDL, CHOLHDL, LDLCALC, LDLDIRECT  Wt Readings from Last 3 Encounters:  03/02/19 294 lb (133.4 kg)  02/25/19 296 lb (134.3 kg)  02/09/19 296 lb 12.8 oz (134.6 kg)     Objective:    Vital Signs:  BP (!) 143/82    Pulse 68   Ht 6\' 2"  (1.88 m)   Wt 294 lb (133.4 kg)   BMI 37.75 kg/m    VITAL SIGNS:  reviewed GEN:  no acute distress EYES:  sclerae anicteric, EOMI - Extraocular Movements Intact RESPIRATORY:  normal respiratory effort, symmetric expansion CARDIOVASCULAR:  no peripheral edema SKIN:  no rash, lesions or ulcers. MUSCULOSKELETAL:  no obvious deformities. NEURO:  alert and oriented x 3, no obvious focal deficit PSYCH:  normal affect  ASSESSMENT & PLAN:    Family history of sudden death, suspect MI - brother suddenly died at age 59 of suspected MI - pt underwent myoview stress test which was read as low risk, no ischemia - he denies anginal symtpoms - he questions if he needs a Ca score (sister had this done after brother died) - in consultation with Dr. Royann Shiversroitoru, he does not need a calcium score - we are already treating secondary prevention by controlling his BP and cholesterol   Paroxysmal atri/al fibrillation - underwent successful cardioversion on 02/02/19 - continue eliquis - continue tikosyn 500 mcg BID, lopressor 37.5 mg BID   Hypertension - continue losartan, spironolactone - recent BMP with stable sCr of 1.25 and K 5.0    Hyperlipidemia - continue lipitor - last lipid profile 12/30/18 with [facility] chol: 136 LDL: 65 HDL:46 Trig: 128   Obesity - he is cleared to exercise without restrictions   Ascending aorta measures 3.8 cm 2018 - in consultation with Dr. Royann Shiversroitoru, he does not need repeat imaging at this time, this is the upper end of normal given his size and weight.    COVID-19 Education: The signs and symptoms of COVID-19 were discussed with the patient and how to seek care for testing (follow up with PCP or arrange E-visit).  The importance of social distancing was discussed today.  Time:   Today, I have spent 28 minutes with the patient with telehealth technology discussing the above problems.     Medication Adjustments/Labs and Tests  Ordered: Current medicines are reviewed at length with the patient today.  Concerns regarding medicines are outlined above.   Tests Ordered: No orders of the defined types were placed in this encounter.   Medication Changes: No orders of the defined types were placed in this encounter.   Follow Up:  Either In Person or Virtual in 1 year(s)  Signed, Ledora Bottcher, PA  03/02/2019 10:27 AM    [facility]

## 2019-03-02 ENCOUNTER — Telehealth (INDEPENDENT_AMBULATORY_CARE_PROVIDER_SITE_OTHER): Payer: Managed Care, Other (non HMO) | Admitting: Physician Assistant

## 2019-03-02 ENCOUNTER — Encounter: Payer: Self-pay | Admitting: Physician Assistant

## 2019-03-02 VITALS — BP 143/82 | HR 68 | Ht 74.0 in | Wt 294.0 lb

## 2019-03-02 DIAGNOSIS — Z9989 Dependence on other enabling machines and devices: Secondary | ICD-10-CM

## 2019-03-02 DIAGNOSIS — I10 Essential (primary) hypertension: Secondary | ICD-10-CM

## 2019-03-02 DIAGNOSIS — I480 Paroxysmal atrial fibrillation: Secondary | ICD-10-CM

## 2019-03-02 DIAGNOSIS — G4733 Obstructive sleep apnea (adult) (pediatric): Secondary | ICD-10-CM

## 2019-03-02 DIAGNOSIS — Z8249 Family history of ischemic heart disease and other diseases of the circulatory system: Secondary | ICD-10-CM

## 2019-03-02 DIAGNOSIS — E7800 Pure hypercholesterolemia, unspecified: Secondary | ICD-10-CM

## 2019-03-02 NOTE — Patient Instructions (Signed)
Medication Instructions:  Your physician recommends that you continue on your current medications as directed. Please refer to the Current Medication list given to you today.  *If you need a refill on your cardiac medications before your next appointment, please call your pharmacy*  Follow-Up: At [facility], you and your health needs are our priority.  As part of our continuing mission to provide you with exceptional heart care, we have created designated Provider Care Teams.  These Care Teams include your primary Cardiologist (physician) and Advanced Practice Providers (APPs -  Physician Assistants and Nurse Practitioners) who all work together to provide you with the care you need, when you need it.  Your next appointment:   12 months  The format for your next appointment:   In Person  Provider:   You may see Sanda Klein, MD or one of the following Advanced Practice Providers on your designated Care Team:    Almyra Deforest, PA-C  Fabian Sharp, PA-C or   Roby Lofts, [place]   Other Instructions Please call our office at [phone] two months in advance to schedule your follow-up visit.

## 2019-03-06 ENCOUNTER — Other Ambulatory Visit (HOSPITAL_COMMUNITY): Payer: Self-pay | Admitting: Nurse Practitioner

## 2019-04-16 ENCOUNTER — Other Ambulatory Visit (HOSPITAL_COMMUNITY): Payer: Self-pay | Admitting: Nurse Practitioner

## 2019-05-07 ENCOUNTER — Other Ambulatory Visit (HOSPITAL_COMMUNITY): Payer: Self-pay | Admitting: Nurse Practitioner

## 2019-07-02 DIAGNOSIS — M25512 Pain in left shoulder: Secondary | ICD-10-CM | POA: Insufficient documentation

## 2019-08-02 DIAGNOSIS — M7061 Trochanteric bursitis, right hip: Secondary | ICD-10-CM | POA: Insufficient documentation

## 2019-08-06 ENCOUNTER — Other Ambulatory Visit: Payer: Self-pay

## 2019-08-06 ENCOUNTER — Encounter: Payer: Self-pay | Admitting: Podiatry

## 2019-08-06 ENCOUNTER — Ambulatory Visit (INDEPENDENT_AMBULATORY_CARE_PROVIDER_SITE_OTHER): Payer: Managed Care, Other (non HMO) | Admitting: Podiatry

## 2019-08-06 VITALS — Temp 97.9°F

## 2019-08-06 DIAGNOSIS — M79674 Pain in right toe(s): Secondary | ICD-10-CM

## 2019-08-06 DIAGNOSIS — L600 Ingrowing nail: Secondary | ICD-10-CM | POA: Diagnosis not present

## 2019-08-06 MED ORDER — DOXYCYCLINE HYCLATE 100 MG PO TABS: 100.0000 mg | ORAL_TABLET | Freq: Two times a day (BID) | ORAL | 0 refills | Status: DC

## 2019-08-06 NOTE — Patient Instructions (Signed)

## 2019-08-10 ENCOUNTER — Encounter (HOSPITAL_COMMUNITY): Payer: Self-pay | Admitting: Nurse Practitioner

## 2019-08-10 ENCOUNTER — Other Ambulatory Visit: Payer: Self-pay

## 2019-08-10 ENCOUNTER — Ambulatory Visit (HOSPITAL_COMMUNITY)
Admission: RE | Admit: 2019-08-10 | Discharge: 2019-08-10 | Disposition: A | Discharge status: Home / Self Care | Payer: Managed Care, Other (non HMO) | Source: Ambulatory Visit | Attending: Nurse Practitioner | Admitting: Nurse Practitioner

## 2019-08-10 VITALS — BP 146/96 | HR 62 | Ht 74.0 in | Wt 297.2 lb

## 2019-08-10 DIAGNOSIS — Z888 Allergy status to other drugs, medicaments and biological substances status: Secondary | ICD-10-CM | POA: Diagnosis not present

## 2019-08-10 DIAGNOSIS — N182 Chronic kidney disease, stage 2 (mild): Secondary | ICD-10-CM | POA: Insufficient documentation

## 2019-08-10 DIAGNOSIS — Z79899 Other long term (current) drug therapy: Secondary | ICD-10-CM | POA: Insufficient documentation

## 2019-08-10 DIAGNOSIS — I10 Essential (primary) hypertension: Secondary | ICD-10-CM | POA: Insufficient documentation

## 2019-08-10 DIAGNOSIS — I129 Hypertensive chronic kidney disease with stage 1 through stage 4 chronic kidney disease, or unspecified chronic kidney disease: Secondary | ICD-10-CM | POA: Insufficient documentation

## 2019-08-10 DIAGNOSIS — E7800 Pure hypercholesterolemia, unspecified: Secondary | ICD-10-CM | POA: Diagnosis not present

## 2019-08-10 DIAGNOSIS — Z8249 Family history of ischemic heart disease and other diseases of the circulatory system: Secondary | ICD-10-CM | POA: Diagnosis not present

## 2019-08-10 DIAGNOSIS — D6869 Other thrombophilia: Secondary | ICD-10-CM | POA: Diagnosis not present

## 2019-08-10 DIAGNOSIS — I480 Paroxysmal atrial fibrillation: Secondary | ICD-10-CM | POA: Diagnosis not present

## 2019-08-10 DIAGNOSIS — Z880 Allergy status to penicillin: Secondary | ICD-10-CM | POA: Insufficient documentation

## 2019-08-10 DIAGNOSIS — I4819 Other persistent atrial fibrillation: Secondary | ICD-10-CM | POA: Insufficient documentation

## 2019-08-10 DIAGNOSIS — Z7901 Long term (current) use of anticoagulants: Secondary | ICD-10-CM | POA: Insufficient documentation

## 2019-08-10 DIAGNOSIS — G4733 Obstructive sleep apnea (adult) (pediatric): Secondary | ICD-10-CM | POA: Insufficient documentation

## 2019-08-10 LAB — BASIC METABOLIC PANEL
Anion gap: 9 (ref 5–15)
BUN: 24 mg/dL — ABNORMAL HIGH (ref 6–20)
CO2: 25 mmol/L (ref 22–32)
Calcium: 9.1 mg/dL (ref 8.9–10.3)
Chloride: 104 mmol/L (ref 98–111)
Creatinine, Ser: 1.22 mg/dL (ref 0.61–1.24)
GFR calc Af Amer: >60 mL/min (ref >60)
GFR calc non Af Amer: >60 mL/min (ref >60)
Glucose, Bld: 136 mg/dL — ABNORMAL HIGH (ref 70–99)
Potassium: 4.4 mmol/L (ref 3.5–5.1)
Sodium: 138 mmol/L (ref 135–145)

## 2019-08-10 LAB — MAGNESIUM: Magnesium: 2 mg/dL (ref 1.7–2.4)

## 2019-08-10 NOTE — Progress Notes (Signed)
Primary Care Physician: Garrett Cruel, MD Referring Physician: [place] Cardiologist: Garrett Hanson is a 59 y.o. male with a h/o HTN, obesity, recently diagnosed sleep apnea, started CPAP 4 weeks ago. He was found to be in rate controlled afib at time of physical mid summer and was sent to Cardiology. He was set up for cardioversion, 9/24,  and unfortunately would not shock out. He came into  the afib clinic for other options.  He reports that 20 years ago he had a couple of episodes of rapid heart rate and possibly Dr. Caryl Comes, can't for sure remember the name, did cardiac mapping, but no ablation was done and since this was a very infrequent thing, no other treatment was indicated at the time. Now, pt thinks he could have been in afib for a year, as it was not present on his last physical exam. He may have had some intermittent episodes before that. He went to [place] in May and was more short of breath when they were out exploring the area. Echo showed severe enlargement of left and right atrium, 47 mm with volume of 93.  He  was more symptomatic with fatigue, dyspnea, hard to do his usual activities.  F/u in afib clinic 10/26, f/u tikosyn start, he is staying in [place], feels some better, but he has just been in SR x one week and has just been using his CPAP for a few weeks, so he anticipates that he will be feeling better with time. He is very serious that he wants to lose weight and become more active.  F/u in afib clinic 12/7, one month after tikosyn start. He is staying in [place]. Bp has improved on spironolactone and K+ levels improved. He feels well. Being complaint with CPAP and Tikosyn, apixaban.  F/u with afib clinic, 8/8. He went back into afib yesterday. He does not  know of any trigger. Continues on Tikosyn and eliquis, no missed doses. He has noted some low BP readings at home. Feels sluggish in afib.  He is back in afib clinic, 10/15. After visit on 8/8, pt  did spontaneously revert to SR and did not require cardioversion. However, he has been out of rhythm in rate controlled afib  X 2 weeks and will pursue cardioversion. He does not know any definite trigger.  F/u in clinic after cardioversion 11/4 after successful cardioversion.He has not noted any further afib.   F/u in afib clinic, 05/30/18. He reports that he is doing well staying in rhythm, compliant with Tikosyn.   F/u in afib clinic 01/26/19 for return of afib last week. He feels the stressor for afib is that his brother had sudden death in the last few weeks presumed to be from an MI.His mother was then in the hospital for chest pain but no MI. He continues on Tikosyn. He would prefer a cardioversion instead of proceeding to ablation. No missed anticoagulation.   F/u in afib clinic 10/19. He had successful DCCV and is reaming in SR. He still is  pending  an appointment with Dr. Loletha Grayer or his team for further evaluation of possible CAD with brother's recent sudden death thought 2/2 CAD.   F/u in afib clinic, 08/10/19. He remains in SR. No afib to report. He has had his covid shots. Taking dofetilide and eliquis without issues. CHA2DS2VASc score of 1.   Today, he denies symptoms of palpitations, chest pain,   orthopnea, PND, lower extremity edema,  dizziness, presyncope, syncope, or neurologic sequela.  The patient is tolerating medications without difficulties and is otherwise without complaint today.   Past Medical History:  Diagnosis Date  . Arthritis    "left knee; probably in the shoulders; left hip" (02/05/2017)  . Chronic bronchitis (HCC)    "get it ~ q other year" (02/05/2017)  . Chronic urticaria   . CKD (chronic kidney disease), stage II    pt denies this hx on 02/05/2017  . Complication of anesthesia    pt states awaken during colonscopy   . Dysrhythmia    a fib  . Essential hypertension   . History of blood transfusion 1961  . History of common duodenal ulcer 1980  . History of  kidney stones   . Lymphadenopathy    a. evaluated by heme-onc 04/2016, under surveillance.  . OSA on CPAP dx'd ~ 09/2016  . Persistent atrial fibrillation (HCC)   . Pneumonia    "X2" (02/05/2017)  . Pure hypercholesterolemia    Past Surgical History:  Procedure Laterality Date  . ANTERIOR CERVICAL DECOMP/DISCECTOMY FUSION  04/2005   "used piece from my hip"  . CARDIOVERSION N/A 01/14/2017   Procedure: CARDIOVERSION;  Surgeon: Garrett Bathe, MD;  Location: [facility] ENDOSCOPY;  Service: Cardiovascular;  Laterality: N/A;  . CARDIOVERSION N/A 02/07/2017   Procedure: CARDIOVERSION;  Surgeon: Garrett Si, MD;  Location: [facility] ENDOSCOPY;  Service: Cardiovascular;  Laterality: N/A;  . CARDIOVERSION N/A 02/10/2018   Procedure: CARDIOVERSION;  Surgeon: Garrett Fair, MD;  Location: [facility] ENDOSCOPY;  Service: Cardiovascular;  Laterality: N/A;  . CARDIOVERSION N/A 02/02/2019   Procedure: CARDIOVERSION;  Surgeon: Garrett Masson, MD;  Location: [facility] ENDOSCOPY;  Service: Cardiovascular;  Laterality: N/A;  . COLONOSCOPY W/ BIOPSIES AND POLYPECTOMY  ~ 2012   "benign"  . ELBOW SURGERY Right    "Tommy John surgery"  . EXTRACORPOREAL SHOCK WAVE LITHOTRIPSY  1990s  . INGUINAL HERNIA REPAIR Right 1980  . JOINT REPLACEMENT     left knee  . KNEE ARTHROSCOPY Right ~ 1990  . SHOULDER ARTHROSCOPY W/ ROTATOR CUFF REPAIR Right 2000s X 2  . TONSILLECTOMY     "I think I did"  . TOTAL HIP ARTHROPLASTY Left 06/04/2017   Procedure: LEFT TOTAL HIP ARTHROPLASTY ANTERIOR APPROACH;  Surgeon: Garrett Romans, MD;  Location: [facility] ORS;  Service: Orthopedics;  Laterality: Left;  . TOTAL KNEE ARTHROPLASTY Left 02/07/2015   Procedure: LEFT TOTAL KNEE ARTHROPLASTY;  Surgeon: Garrett Romans, MD;  Location: [facility] ORS;  Service: Orthopedics;  Laterality: Left;    Current Outpatient Medications  Medication Sig Dispense Refill  . albuterol (PROVENTIL HFA;VENTOLIN HFA) 108 (90 Base) MCG/ACT inhaler Inhale 1-2 puffs into the lungs as needed  for wheezing or shortness of breath.     Marland Kitchen atorvastatin (LIPITOR) 40 MG tablet Take 40 mg by mouth every evening.    . clindamycin (CLEOCIN) 300 MG capsule clindamycin HCl 300 mg capsule  TAKE 3 CAPSULES BY MOUTH 1 HOUR BEFORE PROCEDURE    . dofetilide (TIKOSYN) 500 MCG capsule TAKE 1 CAPSULE BY MOUTH TWICE A DAY 60 capsule 6  . doxycycline (VIBRA-TABS) 100 MG tablet Take 1 tablet (100 mg total) by mouth 2 (two) times daily. 14 tablet 0  . DUEXIS 800-26.6 MG TABS Take 1 tablet by mouth as needed.     Marland Kitchen ELIQUIS 5 MG TABS tablet TAKE 1 TABLET TWICE A DAY 180 tablet 2  . EPIPEN 2-PAK 0.3 MG/0.3ML SOAJ injection Inject 0.3 mg into the muscle daily as  needed for anaphylaxis.  3  . famotidine (PEPCID) 40 MG tablet Take 40 mg by mouth 2 (two) times daily.    . fluticasone (FLONASE) 50 MCG/ACT nasal spray Place 1 spray into both nostrils daily as needed for allergies.    Marland Kitchen KLOR-CON M20 20 MEQ tablet TAKE 1 TABLET TWICE A DAY 180 tablet 3  . losartan (COZAAR) 100 MG tablet Take 100 mg by mouth daily.     . methocarbamol (ROBAXIN) 500 MG tablet Take 1 tablet (500 mg total) by mouth every 6 (six) hours as needed for muscle spasms. (Patient taking differently: Take 500 mg by mouth as needed for muscle spasms. ) 40 tablet 0  . metoprolol tartrate (LOPRESSOR) 25 MG tablet Taking 1 tablet by mouth in the am and 2 tablet at night    . nystatin (MYCOSTATIN/NYSTOP) powder Apply 1 Bottle topically as needed (irritation).     Marland Kitchen spironolactone (ALDACTONE) 25 MG tablet Take 1 tablet (25 mg total) by mouth daily. 90 tablet 3  . tadalafil (ADCIRCA/CIALIS) 20 MG tablet Take 40-100 mg by mouth daily as needed for erectile dysfunction.     No current facility-administered medications for this encounter.    Allergies  Allergen Reactions  . Contrast Media [Iodinated Diagnostic Agents] Shortness Of Breath and Palpitations    Broke out in sweats.  . Penicillins Anaphylaxis    Has patient had a PCN reaction causing  immediate rash, facial/tongue/throat swelling, SOB or lightheadedness with hypotension: Yes Has patient had a PCN reaction causing severe rash involving mucus membranes or skin necrosis: No Has patient had a PCN reaction that required hospitalization:Yes Has patient had a PCN reaction occurring within the last 10 years: No If all of the above answers are "NO", then may proceed with Cephalosporin use.   Marland Kitchen Singulair [Montelukast Sodium] Swelling    Lips, mouth, and hand swelling     Social History   Socioeconomic History  . Marital status: Married    Spouse name: Not on file  . Number of children: Not on file  . Years of education: Not on file  . Highest education level: Not on file  Occupational History  . Not on file  Tobacco Use  . Smoking status: Never Smoker  . Smokeless tobacco: Never Used  Substance and Sexual Activity  . Alcohol use: Yes    Comment: 02/05/2017 "2-4 glasses of wine/month"  . Drug use: No  . Sexual activity: Yes  Other Topics Concern  . Not on file  Social History Narrative   Pt lives in [place].  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   Social Determinants of Health   Financial Resource Strain:   . Difficulty of Paying Living Expenses:   Food Insecurity:   . Worried About Programme researcher, broadcasting/film/video in the Last Year:   . Barista in the Last Year:   Transportation Needs:   . Freight forwarder (Medical):   Marland Kitchen Lack of Transportation (Non-Medical):   Physical Activity:   . Days of Exercise per Week:   . Minutes of Exercise per Session:   Stress:   . Feeling of Stress :   Social Connections:   . Frequency of Communication with Friends and Family:   . Frequency of Social Gatherings with Friends and Family:   . Attends Religious Services:   . Active Member of Clubs or Organizations:   . Attends Banker Meetings:   Marland Kitchen Marital Status:  Intimate Partner Violence:   . Fear of Current or Ex-Partner:   .  Emotionally Abused:   Marland Kitchen Physically Abused:   . Sexually Abused:     Family History  Problem Relation Age of Onset  . Hypertension Mother   . Hyperlipidemia Mother   . Breast cancer Mother   . Uterine cancer Mother   . Sleep apnea Mother   . Hypertension Brother   . Hyperlipidemia Brother   . Cancer Brother     ROS- All systems are reviewed and negative except as per the HPI above  Physical Exam: Vitals:   08/10/19 1536  BP: (!) 146/96  Pulse: 62  Weight: 134.8 kg  Height: 6\' 2"  (1.88 m)   Wt Readings from Last 3 Encounters:  08/10/19 134.8 kg  03/02/19 133.4 kg  02/25/19 134.3 kg    Labs: Lab Results  Component Value Date   NA 140 01/26/2019   K 5.0 01/26/2019   CL 105 01/26/2019   CO2 26 01/26/2019   GLUCOSE 116 (H) 01/26/2019   BUN 15 01/26/2019   CREATININE 1.25 (H) 01/26/2019   CALCIUM 9.1 01/26/2019   MG 2.2 01/26/2019   Lab Results  Component Value Date   INR 1.1 12/31/2016   No results found for: CHOL, HDL, LDLCALC, TRIG   GEN- The patient is well appearing, alert and oriented x 3 today.   Head- normocephalic, atraumatic Eyes-  Sclera clear, conjunctiva pink Ears- hearing intact Oropharynx- clear Neck- supple, no JVP Lymph- no cervical lymphadenopathy Lungs- Clear to ausculation bilaterally, normal work of breathing Heart- regular rate and rhythm, no murmurs, rubs or gallops, PMI not laterally displaced GI- soft, NT, ND, + BS Extremities- no clubbing, cyanosis, or edema MS- no significant deformity or atrophy Skin- no rash or lesion Psych- euthymic mood, full affect Neuro- strength and sensation are intact  EKG- Sinus brady at 62 bpm, PR int 152 ms, qrs int 108 ms, qtc 477 ms Epic records reviewed Echo-Study Conclusions  - Left ventricle: The cavity size was normal. There was mild   concentric hypertrophy. Systolic function was normal. The   estimated ejection fraction was in the range of 60% to 65%. Wall   motion was normal; there  were no regional wall motion   abnormalities. - Aortic valve: Transvalvular velocity was within the normal range.   There was no stenosis. There was no regurgitation. - Aorta: Ascending aortic diameter: 38 mm (S). - Ascending aorta: The ascending aorta was mildly dilated. - Mitral valve: Transvalvular velocity was within the normal range.   There was no evidence for stenosis. There was trivial   regurgitation. - Left atrium: The atrium was severely dilated. - Right ventricle: The cavity size was normal. Wall thickness was   normal. Systolic function was normal. - Right atrium: The atrium was severely dilated. - Tricuspid valve: There was trivial regurgitation.   Assessment and Plan: 1. Persistent symptomatic afib   Had maintained  SR on dofetilide 500 mcg bid with qt stable    He remains in SR   Continue CPAP Continue  Metoprolol  at current dose  Continue eliquis 5 mg bid  for chadsvasc score of 1     2. HTN Stable  Continue  spironolactone 25 mg qd, losartan 100 mg qd    F/u here in afib clinic in 6 months for Tikosyn surveillance   03/02/2017 C. Lupita Leash [facility] [address] [place], [place] [phone]

## 2019-08-10 NOTE — Progress Notes (Signed)
Subjective: 59 year old male presents the office over concerns of ingrown toe of the right big toe, lateral aspect.  He thinks there may be some nail left in the corner, he tried cutting himself and he also had a pedicure making it may be a piece of nail into the skin.  Area is very sore he had some mild redness but no drainage or pus. Denies any systemic complaints such as fevers, chills, nausea, vomiting. No acute changes since last appointment, and no other complaints at this time.   Objective: AAO x3, NAD DP/PT pulses palpable bilaterally, CRT less than 3 seconds Incurvation present to lateral aspect the right hallux toenail with localized edema and erythema.  There is no drainage or pus.  There is no ascending cellulitis. No open lesions or pre-ulcerative lesions.  No pain with calf compression, swelling, warmth, erythema  Assessment: Ingrown toenail right lateral hallux  Plan:  -All treatment options discussed with the patient including all alternatives, risks, complications.  -At this time, the patient is requesting partial nail removal with chemical matricectomy to the symptomatic portion of the nail. Risks and complications were discussed with the patient for which they understand and written consent was obtained. Under sterile conditions a total of 3 mL of a mixture of 2% lidocaine plain and 0.5% Marcaine plain was infiltrated in a hallux block fashion. Once anesthetized, the skin was prepped in sterile fashion. A tourniquet was then applied. Next the lateral aspect of hallux nail border was then sharply excised making sure to remove the entire offending nail border. Once the nails were ensured to be removed area was debrided and the underlying skin was intact. There is no purulence identified in the procedure. Next phenol was then applied under standard conditions and copiously irrigated. Silvadene was applied. A dry sterile dressing was applied. After application of the dressing the  tourniquet was removed and there is found to be an immediate capillary refill time to the digit. The patient tolerated the procedure well any complications. Post procedure instructions were discussed the patient for which he verbally understood. Follow-up in one week for nail check or sooner if any problems are to arise. Discussed signs/symptoms of infection and directed to call the office immediately should any occur or go directly to the emergency room. In the meantime, encouraged to call the office with any questions, concerns, changes symptoms. -Doxycyline  -Patient encouraged to call the office with any questions, concerns, change in symptoms.   Return in about 1 week (around 08/13/2019) for nail check .  Vivi Barrack DPM

## 2019-08-13 ENCOUNTER — Encounter: Payer: Self-pay | Admitting: Podiatry

## 2019-08-13 ENCOUNTER — Other Ambulatory Visit: Payer: Self-pay

## 2019-08-13 ENCOUNTER — Ambulatory Visit (INDEPENDENT_AMBULATORY_CARE_PROVIDER_SITE_OTHER): Payer: Managed Care, Other (non HMO) | Admitting: Podiatry

## 2019-08-13 DIAGNOSIS — L600 Ingrowing nail: Secondary | ICD-10-CM | POA: Insufficient documentation

## 2019-08-13 NOTE — Patient Instructions (Signed)

## 2019-08-13 NOTE — Progress Notes (Signed)
Subjective: Garrett Hanson is a 59 y.o.  male returns to office today for follow up evaluation after having right Hallux parital nail avulsion performed. Patient has been soaking using epsom salts and applying topical antibiotic covered with bandaid daily.  Noticed that it was somewhat red yesterday but this may be because the bandages too tight.  Not noticing this today.  Patient denies fevers, chills, nausea, vomiting. Denies any calf pain, chest pain, SOB.   Objective:  Vitals: Reviewed  General: Well developed, nourished, in no acute distress, alert and oriented x3   Dermatology: Skin is warm, dry and supple bilateral. RIGHT hallux nail border appears to be clean, dry, with mild granular tissue and surrounding scab. There is no surrounding erythema, edema, drainage/purulence. The remaining nails appear unremarkable at this time. There are no other lesions or other signs of infection present.  Neurovascular status: Intact. No lower extremity swelling; No pain with calf compression bilateral.  Musculoskeletal: Decreased tenderness to palpation of the syptomatic hallux nail fold. Muscular strength within normal limits bilateral.   Assesement and Plan: S/p partial nail avulsion, doing well.   -Continue soaking in epsom salts twice a day followed by antibiotic ointment and a band-aid. Can leave uncovered at night. Continue this until completely healed.  -If the area has not healed in 2 weeks, call the office for follow-up appointment, or sooner if any problems arise.  -Monitor for any signs/symptoms of infection. Call the office immediately if any occur or go directly to the emergency room. Call with any questions/concerns.  Ovid Curd, DPM

## 2019-08-20 ENCOUNTER — Other Ambulatory Visit: Payer: Self-pay

## 2019-08-20 ENCOUNTER — Ambulatory Visit (HOSPITAL_COMMUNITY)
Admission: RE | Admit: 2019-08-20 | Discharge: 2019-08-20 | Disposition: A | Discharge status: Home / Self Care | Payer: Managed Care, Other (non HMO) | Source: Ambulatory Visit | Attending: Nurse Practitioner | Admitting: Nurse Practitioner

## 2019-08-20 ENCOUNTER — Encounter (HOSPITAL_COMMUNITY): Payer: Self-pay | Admitting: Nurse Practitioner

## 2019-08-20 VITALS — BP 94/60 | HR 82 | Ht 74.0 in | Wt 296.4 lb

## 2019-08-20 DIAGNOSIS — E669 Obesity, unspecified: Secondary | ICD-10-CM | POA: Diagnosis not present

## 2019-08-20 DIAGNOSIS — I129 Hypertensive chronic kidney disease with stage 1 through stage 4 chronic kidney disease, or unspecified chronic kidney disease: Secondary | ICD-10-CM | POA: Insufficient documentation

## 2019-08-20 DIAGNOSIS — I4819 Other persistent atrial fibrillation: Secondary | ICD-10-CM | POA: Insufficient documentation

## 2019-08-20 DIAGNOSIS — N182 Chronic kidney disease, stage 2 (mild): Secondary | ICD-10-CM | POA: Diagnosis not present

## 2019-08-20 DIAGNOSIS — Z91041 Radiographic dye allergy status: Secondary | ICD-10-CM | POA: Diagnosis not present

## 2019-08-20 DIAGNOSIS — Z888 Allergy status to other drugs, medicaments and biological substances status: Secondary | ICD-10-CM | POA: Insufficient documentation

## 2019-08-20 DIAGNOSIS — Z79899 Other long term (current) drug therapy: Secondary | ICD-10-CM | POA: Insufficient documentation

## 2019-08-20 DIAGNOSIS — I480 Paroxysmal atrial fibrillation: Secondary | ICD-10-CM | POA: Diagnosis not present

## 2019-08-20 DIAGNOSIS — Z8249 Family history of ischemic heart disease and other diseases of the circulatory system: Secondary | ICD-10-CM | POA: Insufficient documentation

## 2019-08-20 DIAGNOSIS — Z7901 Long term (current) use of anticoagulants: Secondary | ICD-10-CM | POA: Insufficient documentation

## 2019-08-20 DIAGNOSIS — D6869 Other thrombophilia: Secondary | ICD-10-CM | POA: Diagnosis not present

## 2019-08-20 DIAGNOSIS — G4733 Obstructive sleep apnea (adult) (pediatric): Secondary | ICD-10-CM | POA: Insufficient documentation

## 2019-08-20 DIAGNOSIS — Z880 Allergy status to penicillin: Secondary | ICD-10-CM | POA: Diagnosis not present

## 2019-08-20 LAB — CBC
HCT: 49.8 % (ref 39.0–52.0)
Hemoglobin: 15.9 g/dL (ref 13.0–17.0)
MCH: 29.1 pg (ref 26.0–34.0)
MCHC: 31.9 g/dL (ref 30.0–36.0)
MCV: 91 fL (ref 80.0–100.0)
Platelets: 223 10*3/uL (ref 150–400)
RBC: 5.47 MIL/uL (ref 4.22–5.81)
RDW: 14.2 % (ref 11.5–15.5)
WBC: 9.6 10*3/uL (ref 4.0–10.5)
nRBC: 0 % (ref 0.0–0.2)

## 2019-08-20 LAB — BASIC METABOLIC PANEL WITH GFR
Anion gap: 8 (ref 5–15)
BUN: 23 mg/dL — ABNORMAL HIGH (ref 6–20)
CO2: 25 mmol/L (ref 22–32)
Calcium: 8.8 mg/dL — ABNORMAL LOW (ref 8.9–10.3)
Chloride: 104 mmol/L (ref 98–111)
Creatinine, Ser: 1.44 mg/dL — ABNORMAL HIGH (ref 0.61–1.24)
GFR calc Af Amer: >60 mL/min
GFR calc non Af Amer: 52 mL/min — ABNORMAL LOW
Glucose, Bld: 104 mg/dL — ABNORMAL HIGH (ref 70–99)
Potassium: 5 mmol/L (ref 3.5–5.1)
Sodium: 137 mmol/L (ref 135–145)

## 2019-08-20 NOTE — Progress Notes (Addendum)
Primary Care Physician: Lawerance Cruel, MD Referring Physician: [place] Cardiologist: Dr. Sabra Heck is a 58 y.o. male with a h/o HTN, obesity, recently diagnosed sleep apnea, started CPAP 4 weeks ago. He was found to be in rate controlled afib at time of physical mid summer and was sent to Cardiology. He was set up for cardioversion, 9/24,  and unfortunately would not shock out. He came into  the afib clinic for other options.  He reports that 20 years ago he had a couple of episodes of rapid heart rate and possibly Dr. Caryl Comes, can't for sure remember the name, did cardiac mapping, but no ablation was done and since this was a very infrequent thing, no other treatment was indicated at the time. Now, pt thinks he could have been in afib for a year, as it was not present on his last physical exam. He may have had some intermittent episodes before that. He went to [place] in May and was more short of breath when they were out exploring the area. Echo showed severe enlargement of left and right atrium, 47 mm with volume of 93.  He  was more symptomatic with fatigue, dyspnea, hard to do his usual activities.  F/u in afib clinic 10/26, f/u tikosyn start, he is staying in [place], feels some better, but he has just been in SR x one week and has just been using his CPAP for a few weeks, so he anticipates that he will be feeling better with time. He is very serious that he wants to lose weight and become more active.  F/u in afib clinic 12/7, one month after tikosyn start. He is staying in [place]. Bp has improved on spironolactone and K+ levels improved. He feels well. Being complaint with CPAP and Tikosyn, apixaban.  F/u with afib clinic, 8/8. He went back into afib yesterday. He does not  know of any trigger. Continues on Tikosyn and eliquis, no missed doses. He has noted some low BP readings at home. Feels sluggish in afib.  He is back in afib clinic, 10/15. After visit on 8/8, pt  did spontaneously revert to SR and did not require cardioversion. However, he has been out of rhythm in rate controlled afib  X 2 weeks and will pursue cardioversion. He does not know any definite trigger.  F/u in clinic after cardioversion 11/4 after successful cardioversion.He has not noted any further afib.   F/u in afib clinic, 05/30/18. He reports that he is doing well staying in rhythm, compliant with Tikosyn.   F/u in afib clinic 01/26/19 for return of afib last week. He feels the stressor for afib is that his brother had sudden death in the last few weeks presumed to be from an MI.His mother was then in the hospital for chest pain but no MI. He continues on Tikosyn. He would prefer a cardioversion instead of proceeding to ablation. No missed anticoagulation.   F/u in afib clinic 10/19. He had successful DCCV and is reaming in SR. He still is  pending  an appointment with Dr. Loletha Grayer or his team for further evaluation of possible CAD with brother's recent sudden death thought 2/2 CAD.   F/u in afib clinic, 08/10/19. He remains in SR. No afib to report. He has had his covid shots. Taking dofetilide and eliquis without issues. CHA2DS2VASc score of  F/u in afib clinic, 4/ 29/21, he unfortunately went back into afib that pm after being seen  here and was in SR. His BB was increased, which has resulted with  him going back  into rhythm in the past, however this time, he remains in rate controlled afib. No missed anticoagulation. He has had both covid shots.  Today, he denies symptoms of palpitations, chest pain,   orthopnea, PND, lower extremity edema, dizziness, presyncope, syncope, or neurologic sequela.  The patient is tolerating medications without difficulties and is otherwise without complaint today.   Past Medical History:  Diagnosis Date  . Arthritis    "left knee; probably in the shoulders; left hip" (02/05/2017)  . Chronic bronchitis (HCC)    "get it ~ q other year" (02/05/2017)  . Chronic  urticaria   . CKD (chronic kidney disease), stage II    pt denies this hx on 02/05/2017  . Complication of anesthesia    pt states awaken during colonscopy   . Dysrhythmia    a fib  . Essential hypertension   . History of blood transfusion 1961  . History of common duodenal ulcer 1980  . History of kidney stones   . Lymphadenopathy    a. evaluated by heme-onc 04/2016, under surveillance.  . OSA on CPAP dx'd ~ 09/2016  . Persistent atrial fibrillation (HCC)   . Pneumonia    "X2" (02/05/2017)  . Pure hypercholesterolemia    Past Surgical History:  Procedure Laterality Date  . ANTERIOR CERVICAL DECOMP/DISCECTOMY FUSION  04/2005   "used piece from my hip"  . CARDIOVERSION N/A 01/14/2017   Procedure: CARDIOVERSION;  Surgeon: Jake BatheSkains, Tayo C, MD;  [facility];  Service: Cardiovascular;  Laterality: N/A;  . CARDIOVERSION N/A 02/07/2017   Procedure: CARDIOVERSION;  Surgeon: Chilton Siandolph, Tiffany, MD;  [facility];  Service: Cardiovascular;  Laterality: N/A;  . CARDIOVERSION N/A 02/10/2018   Procedure: CARDIOVERSION;  Surgeon: Thurmon Fairroitoru, Mihai, MD;  Location: [facility] ENDOSCOPY;  Service: Cardiovascular;  Laterality: N/A;  . CARDIOVERSION N/A 02/02/2019   Procedure: CARDIOVERSION;  Surgeon: Lars MassonNelson, Katarina H, MD;  [facility];  Service: Cardiovascular;  Laterality: N/A;  . COLONOSCOPY W/ BIOPSIES AND POLYPECTOMY  ~ 2012   "benign"  . ELBOW SURGERY Right    "Tommy John surgery"  . EXTRACORPOREAL SHOCK WAVE LITHOTRIPSY  1990s  . INGUINAL HERNIA REPAIR Right 1980  . JOINT REPLACEMENT     left knee  . KNEE ARTHROSCOPY Right ~ 1990  . SHOULDER ARTHROSCOPY W/ ROTATOR CUFF REPAIR Right 2000s X 2  . TONSILLECTOMY     "I think I did"  . TOTAL HIP ARTHROPLASTY Left 06/04/2017   Procedure: LEFT TOTAL HIP ARTHROPLASTY ANTERIOR APPROACH;  Surgeon: Durene Romanslin, Matthew, MD;  Location: [facility] ORS;  Service: Orthopedics;  Laterality: Left;  . TOTAL KNEE ARTHROPLASTY Left 02/07/2015    Procedure: LEFT TOTAL KNEE ARTHROPLASTY;  Surgeon: Durene RomansMatthew Olin, MD;  Location: [facility] ORS;  Service: Orthopedics;  Laterality: Left;    Current Outpatient Medications  Medication Sig Dispense Refill  . albuterol (PROVENTIL HFA;VENTOLIN HFA) 108 (90 Base) MCG/ACT inhaler Inhale 1-2 puffs into the lungs as needed for wheezing or shortness of breath.     Marland Kitchen. atorvastatin (LIPITOR) 40 MG tablet Take 40 mg by mouth every evening.    . clindamycin (CLEOCIN) 300 MG capsule clindamycin HCl 300 mg capsule  TAKE 3 CAPSULES BY MOUTH 1 HOUR BEFORE PROCEDURE    . dofetilide (TIKOSYN) 500 MCG capsule TAKE 1 CAPSULE BY MOUTH TWICE A DAY 60 capsule 6  . DUEXIS 800-26.6 MG TABS Take 1 tablet by mouth as needed.     .Marland Kitchen  ELIQUIS 5 MG TABS tablet TAKE 1 TABLET TWICE A DAY 180 tablet 2  . EPIPEN 2-PAK 0.3 MG/0.3ML SOAJ injection Inject 0.3 mg into the muscle daily as needed for anaphylaxis.  3  . famotidine (PEPCID) 40 MG tablet Take 40 mg by mouth 2 (two) times daily.    . fluticasone (FLONASE) 50 MCG/ACT nasal spray Place 1 spray into both nostrils daily as needed for allergies.    Marland Kitchen KLOR-CON M20 20 MEQ tablet TAKE 1 TABLET TWICE A DAY 180 tablet 3  . losartan (COZAAR) 100 MG tablet Take 100 mg by mouth daily.     . methocarbamol (ROBAXIN) 500 MG tablet Take 1 tablet (500 mg total) by mouth every 6 (six) hours as needed for muscle spasms. (Patient taking differently: Take 500 mg by mouth as needed for muscle spasms. ) 40 tablet 0  . metoprolol tartrate (LOPRESSOR) 25 MG tablet Taking 2 tablets by mouth in the am and 2 tablet at night    . nystatin (MYCOSTATIN/NYSTOP) powder Apply 1 Bottle topically as needed (irritation).     Marland Kitchen spironolactone (ALDACTONE) 25 MG tablet Take 1 tablet (25 mg total) by mouth daily. 90 tablet 3  . tadalafil (ADCIRCA/CIALIS) 20 MG tablet Take 40-100 mg by mouth daily as needed for erectile dysfunction.     No current facility-administered medications for this encounter.    Allergies    Allergen Reactions  . Contrast Media [Iodinated Diagnostic Agents] Shortness Of Breath and Palpitations    Broke out in sweats.  . Penicillins Anaphylaxis    Has patient had a PCN reaction causing immediate rash, facial/tongue/throat swelling, SOB or lightheadedness with hypotension: Yes Has patient had a PCN reaction causing severe rash involving mucus membranes or skin necrosis: No Has patient had a PCN reaction that required hospitalization:Yes Has patient had a PCN reaction occurring within the last 10 years: No If all of the above answers are "NO", then may proceed with Cephalosporin use.   Marland Kitchen Singulair [Montelukast Sodium] Swelling    Lips, mouth, and hand swelling     Social History   Socioeconomic History  . Marital status: Married    Spouse name: Not on file  . Number of children: Not on file  . Years of education: Not on file  . Highest education level: Not on file  Occupational History  . Not on file  Tobacco Use  . Smoking status: Never Smoker  . Smokeless tobacco: Never Used  Substance and Sexual Activity  . Alcohol use: Yes    Comment: 02/05/2017 "2-4 glasses of wine/month"  . Drug use: No  . Sexual activity: Yes  Other Topics Concern  . Not on file  Social History Narrative   Pt lives in [place].  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   Social Determinants of Health   Financial Resource Strain:   . Difficulty of Paying Living Expenses:   Food Insecurity:   . Worried About Programme researcher, broadcasting/film/video in the Last Year:   . Barista in the Last Year:   Transportation Needs:   . Freight forwarder (Medical):   Marland Kitchen Lack of Transportation (Non-Medical):   Physical Activity:   . Days of Exercise per Week:   . Minutes of Exercise per Session:   Stress:   . Feeling of Stress :   Social Connections:   . Frequency of Communication with Friends and Family:   . Frequency of Social Gatherings with  Friends and Family:   . Attends  Religious Services:   . Active Member of Clubs or Organizations:   . Attends Banker Meetings:   Marland Kitchen Marital Status:   Intimate Partner Violence:   . Fear of Current or Ex-Partner:   . Emotionally Abused:   Marland Kitchen Physically Abused:   . Sexually Abused:     Family History  Problem Relation Age of Onset  . Hypertension Mother   . Hyperlipidemia Mother   . Breast cancer Mother   . Uterine cancer Mother   . Sleep apnea Mother   . Hypertension Brother   . Hyperlipidemia Brother   . Cancer Brother     ROS- All systems are reviewed and negative except as per the HPI above  Physical Exam: Vitals:   08/20/19 1541  BP: 94/60  Pulse: 82  Weight: 134.4 kg  Height: 6\' 2"  (1.88 m)   Wt Readings from Last 3 Encounters:  08/20/19 134.4 kg  08/10/19 134.8 kg  03/02/19 133.4 kg    Labs: Lab Results  Component Value Date   NA 138 08/10/2019   K 4.4 08/10/2019   CL 104 08/10/2019   CO2 25 08/10/2019   GLUCOSE 136 (H) 08/10/2019   BUN 24 (H) 08/10/2019   CREATININE 1.22 08/10/2019   CALCIUM 9.1 08/10/2019   MG 2.0 08/10/2019   Lab Results  Component Value Date   INR 1.1 12/31/2016   No results found for: CHOL, HDL, LDLCALC, TRIG   GEN- The patient is well appearing, alert and oriented x 3 today.   Head- normocephalic, atraumatic Eyes-  Sclera clear, conjunctiva pink Ears- hearing intact Oropharynx- clear Neck- supple, no JVP Lymph- no cervical lymphadenopathy Lungs- Clear to ausculation bilaterally, normal work of breathing Heart- irregular rate and rhythm, no murmurs, rubs or gallops, PMI not laterally displaced GI- soft, NT, ND, + BS Extremities- no clubbing, cyanosis, or edema MS- no significant deformity or atrophy Skin- no rash or lesion Psych- euthymic mood, full affect Neuro- strength and sensation are intact  EKG-  afib at 82 bpm, qrs int 98 ms, qtc 467 ms Epic records reviewed Echo-Study Conclusions  - Left ventricle: The cavity size was  normal. There was mild   concentric hypertrophy. Systolic function was normal. The   estimated ejection fraction was in the range of 60% to 65%. Wall   motion was normal; there were no regional wall motion   abnormalities. - Aortic valve: Transvalvular velocity was within the normal range.   There was no stenosis. There was no regurgitation. - Aorta: Ascending aortic diameter: 38 mm (S). - Ascending aorta: The ascending aorta was mildly dilated. - Mitral valve: Transvalvular velocity was within the normal range.   There was no evidence for stenosis. There was trivial   regurgitation. - Left atrium: The atrium was severely dilated. - Right ventricle: The cavity size was normal. Wall thickness was   normal. Systolic function was normal. - Right atrium: The atrium was severely dilated. - Tricuspid valve: There was trivial regurgitation.   Assessment and Plan: 1. Persistent symptomatic afib   Went into afib without any know triggers last week, last cardioversion was in the fall of 2020 Will schedule cardioversion  Continue increase dose of BB but return to previous dose day of cardioversion  Hold losartan while  on increased BB dose 2/2 sof BP's Continue CPAP Continue eliquis 5 mg bid  for chadsvasc score of 1,states no missed doses Cbc/bmet today  2. HTN Soft now on higher BB dose Hold losartan while on higher dose, resume when goes back to previous lower  BB dose at time of cardioversion  If afib returns in a short period of time, then we should repeat  echo to see if ablation candidate    Lupita Leash C. Matthew [facility] [address] [place], [place] [address] [phone]

## 2019-08-20 NOTE — H&P (View-Only) (Signed)
Primary Care Physician: Lawerance Cruel, MD Referring Physician: [place] Cardiologist: Dr. Sabra Heck is a 58 y.o. male with a h/o HTN, obesity, recently diagnosed sleep apnea, started CPAP 4 weeks ago. He was found to be in rate controlled afib at time of physical mid summer and was sent to Cardiology. He was set up for cardioversion, 9/24,  and unfortunately would not shock out. He came into  the afib clinic for other options.  He reports that 20 years ago he had a couple of episodes of rapid heart rate and possibly Dr. Caryl Comes, can't for sure remember the name, did cardiac mapping, but no ablation was done and since this was a very infrequent thing, no other treatment was indicated at the time. Now, pt thinks he could have been in afib for a year, as it was not present on his last physical exam. He may have had some intermittent episodes before that. He went to [place] in May and was more short of breath when they were out exploring the area. Echo showed severe enlargement of left and right atrium, 47 mm with volume of 93.  He  was more symptomatic with fatigue, dyspnea, hard to do his usual activities.  F/u in afib clinic 10/26, f/u tikosyn start, he is staying in [place], feels some better, but he has just been in SR x one week and has just been using his CPAP for a few weeks, so he anticipates that he will be feeling better with time. He is very serious that he wants to lose weight and become more active.  F/u in afib clinic 12/7, one month after tikosyn start. He is staying in [place]. Bp has improved on spironolactone and K+ levels improved. He feels well. Being complaint with CPAP and Tikosyn, apixaban.  F/u with afib clinic, 8/8. He went back into afib yesterday. He does not  know of any trigger. Continues on Tikosyn and eliquis, no missed doses. He has noted some low BP readings at home. Feels sluggish in afib.  He is back in afib clinic, 10/15. After visit on 8/8, pt  did spontaneously revert to SR and did not require cardioversion. However, he has been out of rhythm in rate controlled afib  X 2 weeks and will pursue cardioversion. He does not know any definite trigger.  F/u in clinic after cardioversion 11/4 after successful cardioversion.He has not noted any further afib.   F/u in afib clinic, 05/30/18. He reports that he is doing well staying in rhythm, compliant with Tikosyn.   F/u in afib clinic 01/26/19 for return of afib last week. He feels the stressor for afib is that his brother had sudden death in the last few weeks presumed to be from an MI.His mother was then in the hospital for chest pain but no MI. He continues on Tikosyn. He would prefer a cardioversion instead of proceeding to ablation. No missed anticoagulation.   F/u in afib clinic 10/19. He had successful DCCV and is reaming in SR. He still is  pending  an appointment with Dr. Loletha Grayer or his team for further evaluation of possible CAD with brother's recent sudden death thought 2/2 CAD.   F/u in afib clinic, 08/10/19. He remains in SR. No afib to report. He has had his covid shots. Taking dofetilide and eliquis without issues. CHA2DS2VASc score of  F/u in afib clinic, 4/ 29/21, he unfortunately went back into afib that pm after being seen  here and was in SR. His BB was increased, which has resulted with  him going back  into rhythm in the past, however this time, he remains in rate controlled afib. No missed anticoagulation. He has had both covid shots.  Today, he denies symptoms of palpitations, chest pain,   orthopnea, PND, lower extremity edema, dizziness, presyncope, syncope, or neurologic sequela.  The patient is tolerating medications without difficulties and is otherwise without complaint today.   Past Medical History:  Diagnosis Date  . Arthritis    "left knee; probably in the shoulders; left hip" (02/05/2017)  . Chronic bronchitis (HCC)    "get it ~ q other year" (02/05/2017)  . Chronic  urticaria   . CKD (chronic kidney disease), stage II    pt denies this hx on 02/05/2017  . Complication of anesthesia    pt states awaken during colonscopy   . Dysrhythmia    a fib  . Essential hypertension   . History of blood transfusion 1961  . History of common duodenal ulcer 1980  . History of kidney stones   . Lymphadenopathy    a. evaluated by heme-onc 04/2016, under surveillance.  . OSA on CPAP dx'd ~ 09/2016  . Persistent atrial fibrillation (HCC)   . Pneumonia    "X2" (02/05/2017)  . Pure hypercholesterolemia    Past Surgical History:  Procedure Laterality Date  . ANTERIOR CERVICAL DECOMP/DISCECTOMY FUSION  04/2005   "used piece from my hip"  . CARDIOVERSION N/A 01/14/2017   Procedure: CARDIOVERSION;  Surgeon: Skains, Rutledge C, MD;  Location: [facility];  Service: Cardiovascular;  Laterality: N/A;  . CARDIOVERSION N/A 02/07/2017   Procedure: CARDIOVERSION;  Surgeon: Fairfield, Tiffany, MD;  Location: [facility];  Service: Cardiovascular;  Laterality: N/A;  . CARDIOVERSION N/A 02/10/2018   Procedure: CARDIOVERSION;  Surgeon: Croitoru, Mihai, MD;  Location: [facility];  Service: Cardiovascular;  Laterality: N/A;  . CARDIOVERSION N/A 02/02/2019   Procedure: CARDIOVERSION;  Surgeon: Nelson, Katarina H, MD;  Location: [facility];  Service: Cardiovascular;  Laterality: N/A;  . COLONOSCOPY W/ BIOPSIES AND POLYPECTOMY  ~ 2012   "benign"  . ELBOW SURGERY Right    "Tommy John surgery"  . EXTRACORPOREAL SHOCK WAVE LITHOTRIPSY  1990s  . INGUINAL HERNIA REPAIR Right 1980  . JOINT REPLACEMENT     left knee  . KNEE ARTHROSCOPY Right ~ 1990  . SHOULDER ARTHROSCOPY W/ ROTATOR CUFF REPAIR Right 2000s X 2  . TONSILLECTOMY     "I think I did"  . TOTAL HIP ARTHROPLASTY Left 06/04/2017   Procedure: LEFT TOTAL HIP ARTHROPLASTY ANTERIOR APPROACH;  Surgeon: Olin, Matthew, MD;  Location: [facility];  Service: Orthopedics;  Laterality: Left;  . TOTAL KNEE ARTHROPLASTY Left 02/07/2015    Procedure: LEFT TOTAL KNEE ARTHROPLASTY;  Surgeon: Matthew Olin, MD;  Location: [facility];  Service: Orthopedics;  Laterality: Left;    Current Outpatient Medications  Medication Sig Dispense Refill  . albuterol (PROVENTIL HFA;VENTOLIN HFA) 108 (90 Base) MCG/ACT inhaler Inhale 1-2 puffs into the lungs as needed for wheezing or shortness of breath.     . atorvastatin (LIPITOR) 40 MG tablet Take 40 mg by mouth every evening.    . clindamycin (CLEOCIN) 300 MG capsule clindamycin HCl 300 mg capsule  TAKE 3 CAPSULES BY MOUTH 1 HOUR BEFORE PROCEDURE    . dofetilide (TIKOSYN) 500 MCG capsule TAKE 1 CAPSULE BY MOUTH TWICE A DAY 60 capsule 6  . DUEXIS 800-26.6 MG TABS Take 1 tablet by mouth as needed.     .   ELIQUIS 5 MG TABS tablet TAKE 1 TABLET TWICE A DAY 180 tablet 2  . EPIPEN 2-PAK 0.3 MG/0.3ML SOAJ injection Inject 0.3 mg into the muscle daily as needed for anaphylaxis.  3  . famotidine (PEPCID) 40 MG tablet Take 40 mg by mouth 2 (two) times daily.    . fluticasone (FLONASE) 50 MCG/ACT nasal spray Place 1 spray into both nostrils daily as needed for allergies.    Marland Kitchen KLOR-CON M20 20 MEQ tablet TAKE 1 TABLET TWICE A DAY 180 tablet 3  . losartan (COZAAR) 100 MG tablet Take 100 mg by mouth daily.     . methocarbamol (ROBAXIN) 500 MG tablet Take 1 tablet (500 mg total) by mouth every 6 (six) hours as needed for muscle spasms. (Patient taking differently: Take 500 mg by mouth as needed for muscle spasms. ) 40 tablet 0  . metoprolol tartrate (LOPRESSOR) 25 MG tablet Taking 2 tablets by mouth in the am and 2 tablet at night    . nystatin (MYCOSTATIN/NYSTOP) powder Apply 1 Bottle topically as needed (irritation).     Marland Kitchen spironolactone (ALDACTONE) 25 MG tablet Take 1 tablet (25 mg total) by mouth daily. 90 tablet 3  . tadalafil (ADCIRCA/CIALIS) 20 MG tablet Take 40-100 mg by mouth daily as needed for erectile dysfunction.     No current facility-administered medications for this encounter.    Allergies    Allergen Reactions  . Contrast Media [Iodinated Diagnostic Agents] Shortness Of Breath and Palpitations    Broke out in sweats.  . Penicillins Anaphylaxis    Has patient had a PCN reaction causing immediate rash, facial/tongue/throat swelling, SOB or lightheadedness with hypotension: Yes Has patient had a PCN reaction causing severe rash involving mucus membranes or skin necrosis: No Has patient had a PCN reaction that required hospitalization:Yes Has patient had a PCN reaction occurring within the last 10 years: No If all of the above answers are "NO", then may proceed with Cephalosporin use.   Marland Kitchen Singulair [Montelukast Sodium] Swelling    Lips, mouth, and hand swelling     Social History   Socioeconomic History  . Marital status: Married    Spouse name: Not on file  . Number of children: Not on file  . Years of education: Not on file  . Highest education level: Not on file  Occupational History  . Not on file  Tobacco Use  . Smoking status: Never Smoker  . Smokeless tobacco: Never Used  Substance and Sexual Activity  . Alcohol use: Yes    Comment: 02/05/2017 "2-4 glasses of wine/month"  . Drug use: No  . Sexual activity: Yes  Other Topics Concern  . Not on file  Social History Narrative   Pt lives in [place].  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   Social Determinants of Health   Financial Resource Strain:   . Difficulty of Paying Living Expenses:   Food Insecurity:   . Worried About Programme researcher, broadcasting/film/video in the Last Year:   . Barista in the Last Year:   Transportation Needs:   . Freight forwarder (Medical):   Marland Kitchen Lack of Transportation (Non-Medical):   Physical Activity:   . Days of Exercise per Week:   . Minutes of Exercise per Session:   Stress:   . Feeling of Stress :   Social Connections:   . Frequency of Communication with Friends and Family:   . Frequency of Social Gatherings with  Friends and Family:   . Attends  Religious Services:   . Active Member of Clubs or Organizations:   . Attends Banker Meetings:   Marland Kitchen Marital Status:   Intimate Partner Violence:   . Fear of Current or Ex-Partner:   . Emotionally Abused:   Marland Kitchen Physically Abused:   . Sexually Abused:     Family History  Problem Relation Age of Onset  . Hypertension Mother   . Hyperlipidemia Mother   . Breast cancer Mother   . Uterine cancer Mother   . Sleep apnea Mother   . Hypertension Brother   . Hyperlipidemia Brother   . Cancer Brother     ROS- All systems are reviewed and negative except as per the HPI above  Physical Exam: Vitals:   08/20/19 1541  BP: 94/60  Pulse: 82  Weight: 134.4 kg  Height: 6\' 2"  (1.88 m)   Wt Readings from Last 3 Encounters:  08/20/19 134.4 kg  08/10/19 134.8 kg  03/02/19 133.4 kg    Labs: Lab Results  Component Value Date   NA 138 08/10/2019   K 4.4 08/10/2019   CL 104 08/10/2019   CO2 25 08/10/2019   GLUCOSE 136 (H) 08/10/2019   BUN 24 (H) 08/10/2019   CREATININE 1.22 08/10/2019   CALCIUM 9.1 08/10/2019   MG 2.0 08/10/2019   Lab Results  Component Value Date   INR 1.1 12/31/2016   No results found for: CHOL, HDL, LDLCALC, TRIG   GEN- The patient is well appearing, alert and oriented x 3 today.   Head- normocephalic, atraumatic Eyes-  Sclera clear, conjunctiva pink Ears- hearing intact Oropharynx- clear Neck- supple, no JVP Lymph- no cervical lymphadenopathy Lungs- Clear to ausculation bilaterally, normal work of breathing Heart- irregular rate and rhythm, no murmurs, rubs or gallops, PMI not laterally displaced GI- soft, NT, ND, + BS Extremities- no clubbing, cyanosis, or edema MS- no significant deformity or atrophy Skin- no rash or lesion Psych- euthymic mood, full affect Neuro- strength and sensation are intact  EKG-  afib at 82 bpm, qrs int 98 ms, qtc 467 ms Epic records reviewed Echo-Study Conclusions  - Left ventricle: The cavity size was  normal. There was mild   concentric hypertrophy. Systolic function was normal. The   estimated ejection fraction was in the range of 60% to 65%. Wall   motion was normal; there were no regional wall motion   abnormalities. - Aortic valve: Transvalvular velocity was within the normal range.   There was no stenosis. There was no regurgitation. - Aorta: Ascending aortic diameter: 38 mm (S). - Ascending aorta: The ascending aorta was mildly dilated. - Mitral valve: Transvalvular velocity was within the normal range.   There was no evidence for stenosis. There was trivial   regurgitation. - Left atrium: The atrium was severely dilated. - Right ventricle: The cavity size was normal. Wall thickness was   normal. Systolic function was normal. - Right atrium: The atrium was severely dilated. - Tricuspid valve: There was trivial regurgitation.   Assessment and Plan: 1. Persistent symptomatic afib   Went into afib without any know triggers last week, last cardioversion was in the fall of 2020 Will schedule cardioversion  Continue increase dose of BB but return to previous dose day of cardioversion  Hold losartan while  on increased BB dose 2/2 sof BP's Continue CPAP Continue eliquis 5 mg bid  for chadsvasc score of 1,states no missed doses Cbc/bmet today  2. HTN Soft now on higher BB dose Hold losartan while on higher dose, resume when goes back to previous lower  BB dose at time of cardioversion  If afib returns in a short period of time, then we should repeat  echo to see if ablation candidate    Lupita Leash C. [facility] [address] [place], [place] [address] [phone]

## 2019-08-20 NOTE — Patient Instructions (Signed)
Cardioversion scheduled for Wednesday, May 5th  - Arrive at the [facility] and go to admitting at [facility] not eat or drink anything after midnight the night prior to your procedure.  - Take all your morning medication with a sip of water prior to arrival.  - You will not be able to drive home after your procedure.   Hold losartan while on higher dose of metoprolol.  Day of cardioversion return to normal dosing of metoprolol

## 2019-08-22 ENCOUNTER — Other Ambulatory Visit (HOSPITAL_COMMUNITY): Payer: Managed Care, Other (non HMO)

## 2019-08-22 ENCOUNTER — Other Ambulatory Visit (HOSPITAL_COMMUNITY)
Admission: RE | Admit: 2019-08-22 | Discharge: 2019-08-22 | Disposition: A | Discharge status: Home / Self Care | Payer: Managed Care, Other (non HMO) | Source: Ambulatory Visit | Attending: Cardiovascular Disease | Admitting: Cardiovascular Disease

## 2019-08-22 DIAGNOSIS — Z01812 Encounter for preprocedural laboratory examination: Secondary | ICD-10-CM | POA: Insufficient documentation

## 2019-08-22 DIAGNOSIS — Z20822 Contact with and (suspected) exposure to covid-19: Secondary | ICD-10-CM | POA: Diagnosis not present

## 2019-08-22 LAB — SARS CORONAVIRUS 2 (TAT 6-24 HRS): SARS Coronavirus 2: NEGATIVE

## 2019-08-25 NOTE — Anesthesia Preprocedure Evaluation (Addendum)
Anesthesia Evaluation  Patient identified by MRN, date of birth, ID band Patient awake    Reviewed: Allergy & Precautions, NPO status , Patient's Chart, lab work & pertinent test results  Airway Mallampati: II  TM Distance: >3 FB     Dental   Pulmonary pneumonia,    breath sounds clear to auscultation       Cardiovascular hypertension, + dysrhythmias  Rhythm:Regular Rate:Normal     Neuro/Psych    GI/Hepatic negative GI ROS, Neg liver ROS,   Endo/Other    Renal/GU Renal disease     Musculoskeletal   Abdominal   Peds  Hematology   Anesthesia Other Findings   Reproductive/Obstetrics                            Anesthesia Physical Anesthesia Plan  ASA: III  Anesthesia Plan: General   Post-op Pain Management:    Induction: Intravenous  PONV Risk Score and Plan: 2 and Propofol infusion  Airway Management Planned: Nasal Cannula and Simple Face Mask  Additional Equipment:   Intra-op Plan:   Post-operative Plan:   Informed Consent: I have reviewed the patients History and Physical, chart, labs and discussed the procedure including the risks, benefits and alternatives for the proposed anesthesia with the patient or authorized representative who has indicated his/her understanding and acceptance.     Dental advisory given  Plan Discussed with: Anesthesiologist and CRNA  Anesthesia Plan Comments:         Anesthesia Quick Evaluation

## 2019-08-26 ENCOUNTER — Encounter (HOSPITAL_COMMUNITY)
Admission: RE | Disposition: A | Discharge status: Home / Self Care | Payer: Self-pay | Source: Home / Self Care | Attending: Cardiovascular Disease

## 2019-08-26 ENCOUNTER — Ambulatory Visit (HOSPITAL_COMMUNITY)
Admission: RE | Admit: 2019-08-26 | Discharge: 2019-08-26 | Disposition: A | Discharge status: Home / Self Care | Payer: Managed Care, Other (non HMO) | Attending: Cardiovascular Disease | Admitting: Cardiovascular Disease

## 2019-08-26 ENCOUNTER — Ambulatory Visit (HOSPITAL_COMMUNITY): Payer: Managed Care, Other (non HMO) | Admitting: Certified Registered Nurse Anesthetist

## 2019-08-26 ENCOUNTER — Other Ambulatory Visit: Payer: Self-pay

## 2019-08-26 DIAGNOSIS — Z96642 Presence of left artificial hip joint: Secondary | ICD-10-CM | POA: Diagnosis not present

## 2019-08-26 DIAGNOSIS — Z8249 Family history of ischemic heart disease and other diseases of the circulatory system: Secondary | ICD-10-CM | POA: Diagnosis not present

## 2019-08-26 DIAGNOSIS — Z7901 Long term (current) use of anticoagulants: Secondary | ICD-10-CM | POA: Insufficient documentation

## 2019-08-26 DIAGNOSIS — E669 Obesity, unspecified: Secondary | ICD-10-CM | POA: Diagnosis not present

## 2019-08-26 DIAGNOSIS — N182 Chronic kidney disease, stage 2 (mild): Secondary | ICD-10-CM | POA: Insufficient documentation

## 2019-08-26 DIAGNOSIS — Z880 Allergy status to penicillin: Secondary | ICD-10-CM | POA: Insufficient documentation

## 2019-08-26 DIAGNOSIS — Z91041 Radiographic dye allergy status: Secondary | ICD-10-CM | POA: Diagnosis not present

## 2019-08-26 DIAGNOSIS — Z6837 Body mass index (BMI) 37.0-37.9, adult: Secondary | ICD-10-CM | POA: Insufficient documentation

## 2019-08-26 DIAGNOSIS — I4819 Other persistent atrial fibrillation: Secondary | ICD-10-CM

## 2019-08-26 DIAGNOSIS — Z888 Allergy status to other drugs, medicaments and biological substances status: Secondary | ICD-10-CM | POA: Diagnosis not present

## 2019-08-26 DIAGNOSIS — E7800 Pure hypercholesterolemia, unspecified: Secondary | ICD-10-CM | POA: Diagnosis not present

## 2019-08-26 DIAGNOSIS — M1712 Unilateral primary osteoarthritis, left knee: Secondary | ICD-10-CM | POA: Insufficient documentation

## 2019-08-26 DIAGNOSIS — G4733 Obstructive sleep apnea (adult) (pediatric): Secondary | ICD-10-CM | POA: Diagnosis not present

## 2019-08-26 DIAGNOSIS — I129 Hypertensive chronic kidney disease with stage 1 through stage 4 chronic kidney disease, or unspecified chronic kidney disease: Secondary | ICD-10-CM | POA: Diagnosis not present

## 2019-08-26 DIAGNOSIS — Z96652 Presence of left artificial knee joint: Secondary | ICD-10-CM | POA: Insufficient documentation

## 2019-08-26 DIAGNOSIS — Z79899 Other long term (current) drug therapy: Secondary | ICD-10-CM | POA: Diagnosis not present

## 2019-08-26 HISTORY — PX: CARDIOVERSION: SHX1299

## 2019-08-26 SURGERY — CARDIOVERSION
Anesthesia: General

## 2019-08-26 MED ORDER — PROPOFOL 10 MG/ML IV BOLUS
INTRAVENOUS | Status: DC | PRN
  Administered 2019-08-26: 30 mg via INTRAVENOUS
  Administered 2019-08-26: 80 mg via INTRAVENOUS
  Administered 2019-08-26: 20 mg via INTRAVENOUS

## 2019-08-26 MED ORDER — LIDOCAINE 2% (20 MG/ML) 5 ML SYRINGE
INTRAMUSCULAR | Status: DC | PRN
  Administered 2019-08-26: 40 mg via INTRAVENOUS

## 2019-08-26 MED ORDER — SODIUM CHLORIDE 0.9 % IV SOLN: INTRAVENOUS | Status: DC | PRN

## 2019-08-26 NOTE — Anesthesia Procedure Notes (Signed)
Procedure Name: General with mask airway Date/Time: 08/26/2019 9:07 AM Performed by: Audie Pinto, CRNA Pre-anesthesia Checklist: Patient identified, Emergency Drugs available, Suction available and Patient being monitored Patient Re-evaluated:Patient Re-evaluated prior to induction Oxygen Delivery Method: Ambu bag Placement Confirmation: positive ETCO2 Dental Injury: Teeth and Oropharynx as per pre-operative assessment

## 2019-08-26 NOTE — Anesthesia Postprocedure Evaluation (Signed)
Anesthesia Post Note  Patient: Garrett Hanson  Procedure(s) Performed: CARDIOVERSION (N/A )     Anesthesia Post Evaluation  Last Vitals:  Vitals:   08/26/19 0922 08/26/19 0932  BP: 117/83 120/84  Pulse: (!) 58 (!) 58  Resp: (!) 21 14  Temp: 36.6 C   SpO2: 100% 97%    Last Pain:  Vitals:   08/26/19 0932  TempSrc:   PainSc: 0-No pain                 Yardley Beltran

## 2019-08-26 NOTE — CV Procedure (Signed)
    Cardioversion Note  JACOBIE STAMEY [id number] [birth year]  Procedure: DC Cardioversion Indications: Atrial fib   Procedure Details Consent: Obtained Time Out: Verified patient identification, verified procedure, site/side was marked, verified correct patient position, special equipment/implants available, Radiology Safety Procedures followed,  medications/allergies/relevent history reviewed, required imaging and test results available.  Performed  The patient has been on adequate anticoagulation.  The patient received Lidocaine 40 mg IV followed by  IV  Propofol 130 mg  for sedation.  Synchronous cardioversion was performed at 200, 200 ( with anterior chest pressure)  joules.  The cardioversion was successful     Complications: No apparent complications Patient did tolerate procedure well.   Vesta Mixer, Montez Hageman., MD, [facility] 08/26/2019, 9:24 AM

## 2019-08-26 NOTE — Transfer of Care (Signed)
Immediate Anesthesia Transfer of Care Note  Patient: Garrett Hanson  Procedure(s) Performed: CARDIOVERSION (N/A )  Patient Location: Endoscopy Unit  Anesthesia Type:General  Level of Consciousness: drowsy  Airway & Oxygen Therapy: Patient Spontanous Breathing and Patient connected to nasal cannula oxygen  Post-op Assessment: Report given to RN and Post -op Vital signs reviewed and stable  Post vital signs: Reviewed  Last Vitals:  Vitals Value Taken Time  BP    Temp    Pulse    Resp    SpO2      Last Pain:  Vitals:   08/26/19 0828  TempSrc: Oral  PainSc: 0-No pain         Complications: No apparent anesthesia complications

## 2019-08-26 NOTE — Interval H&P Note (Signed)
History and Physical Interval Note:  08/26/2019 8:56 AM  Garrett Hanson  has presented today for surgery, with the diagnosis of AFIB.  The various methods of treatment have been discussed with the patient and family. After consideration of risks, benefits and other options for treatment, the patient has consented to  Procedure(s): CARDIOVERSION (N/A) as a surgical intervention.  The patient's history has been reviewed, patient examined, no change in status, stable for surgery.  I have reviewed the patient's chart and labs.  Questions were answered to the patient's satisfaction.     [name]

## 2019-09-01 DIAGNOSIS — M19019 Primary osteoarthritis, unspecified shoulder: Secondary | ICD-10-CM | POA: Insufficient documentation

## 2019-09-10 ENCOUNTER — Other Ambulatory Visit: Payer: Self-pay | Admitting: Nurse Practitioner

## 2019-11-20 ENCOUNTER — Telehealth (HOSPITAL_COMMUNITY): Payer: Self-pay | Admitting: Physician Assistant

## 2019-11-20 NOTE — Telephone Encounter (Signed)
Patient called stating he has been in AF since Tuesday (11/17/19).  Pt states he increased his metoprolol and is taking 2 in the AM and 2 in the PM.  Pt also stopped his losartan which is what he says he has done in the past when this happens and he increases metoprolol.  Spoke with Jorja Loa, PA and he said for pt to continue with this and appt was made for pt for 11/24/19.

## 2019-11-24 ENCOUNTER — Other Ambulatory Visit: Payer: Self-pay

## 2019-11-24 ENCOUNTER — Ambulatory Visit (HOSPITAL_COMMUNITY)
Admission: RE | Admit: 2019-11-24 | Discharge: 2019-11-24 | Disposition: A | Discharge status: Home / Self Care | Payer: Managed Care, Other (non HMO) | Source: Ambulatory Visit | Attending: Physician Assistant | Admitting: Physician Assistant

## 2019-11-24 VITALS — BP 110/80 | HR 86 | Ht 74.0 in | Wt 298.4 lb

## 2019-11-24 DIAGNOSIS — N182 Chronic kidney disease, stage 2 (mild): Secondary | ICD-10-CM | POA: Diagnosis not present

## 2019-11-24 DIAGNOSIS — Z6838 Body mass index (BMI) 38.0-38.9, adult: Secondary | ICD-10-CM | POA: Insufficient documentation

## 2019-11-24 DIAGNOSIS — Z7901 Long term (current) use of anticoagulants: Secondary | ICD-10-CM | POA: Diagnosis not present

## 2019-11-24 DIAGNOSIS — G4733 Obstructive sleep apnea (adult) (pediatric): Secondary | ICD-10-CM | POA: Insufficient documentation

## 2019-11-24 DIAGNOSIS — I129 Hypertensive chronic kidney disease with stage 1 through stage 4 chronic kidney disease, or unspecified chronic kidney disease: Secondary | ICD-10-CM | POA: Diagnosis not present

## 2019-11-24 DIAGNOSIS — Z79899 Other long term (current) drug therapy: Secondary | ICD-10-CM | POA: Diagnosis not present

## 2019-11-24 DIAGNOSIS — E669 Obesity, unspecified: Secondary | ICD-10-CM | POA: Diagnosis not present

## 2019-11-24 DIAGNOSIS — I4819 Other persistent atrial fibrillation: Secondary | ICD-10-CM | POA: Diagnosis not present

## 2019-11-24 LAB — BASIC METABOLIC PANEL
Anion gap: 9 (ref 5–15)
BUN: 21 mg/dL — ABNORMAL HIGH (ref 6–20)
CO2: 24 mmol/L (ref 22–32)
Calcium: 9.2 mg/dL (ref 8.9–10.3)
Chloride: 103 mmol/L (ref 98–111)
Creatinine, Ser: 1.2 mg/dL (ref 0.61–1.24)
GFR calc Af Amer: >60 mL/min (ref >60)
GFR calc non Af Amer: >60 mL/min (ref >60)
Glucose, Bld: 95 mg/dL (ref 70–99)
Potassium: 4.9 mmol/L (ref 3.5–5.1)
Sodium: 136 mmol/L (ref 135–145)

## 2019-11-24 LAB — MAGNESIUM: Magnesium: 2.1 mg/dL (ref 1.7–2.4)

## 2019-11-24 NOTE — Progress Notes (Signed)
Thanks for the followup!  

## 2019-11-24 NOTE — Progress Notes (Signed)
Primary Care Physician: Ross, Charles Alan, MD Referring [facility] Cardiologist: Dr. Royann Shivers   EP: Dr Bobetta Lime is a 59 y.o. male with a h/o HTN, obesity, recently diagnosed sleep apnea, started CPAP 4 weeks ago. He was found to be in rate controlled afib at time of physical mid summer and was sent to Cardiology. He was set up for cardioversion, 9/24,  and unfortunately would not shock out. He came into  the afib clinic for other options.  He reports that 20 years ago he had a couple of episodes of rapid heart rate and possibly Dr. Graciela Husbands, can't for sure remember the name, did cardiac mapping, but no ablation was done and since this was a very infrequent thing, no other treatment was indicated at the time. Now, pt thinks he could have been in afib for a year, as it was not present on his last physical exam. He may have had some intermittent episodes before that. He went to [place] in May and was more short of breath when they were out exploring the area. Echo showed severe enlargement of left and right atrium, 47 mm with volume of 93.  He  was more symptomatic with fatigue, dyspnea, hard to do his usual activities.  F/u in afib clinic 10/26, f/u tikosyn start, he is staying in SR, feels some better, but he has just been in SR x one week and has just been using his CPAP for a few weeks, so he anticipates that he will be feeling better with time. He is very serious that he wants to lose weight and become more active.  F/u in afib clinic 12/7, one month after tikosyn start. He is staying in SR. Bp has improved on spironolactone and K+ levels improved. He feels well. Being complaint with CPAP and Tikosyn, apixaban.  F/u with afib clinic, 8/8. He went back into afib yesterday. He does not  know of any trigger. Continues on Tikosyn and eliquis, no missed doses. He has noted some low BP readings at home. Feels sluggish in afib.  He is back in afib clinic, 10/15. After  visit on 8/8, pt did spontaneously revert to SR and did not require cardioversion. However, he has been out of rhythm in rate controlled afib  X 2 weeks and will pursue cardioversion. He does not know any definite trigger.  F/u in clinic after cardioversion 11/4 after successful cardioversion.He has not noted any further afib.   F/u in afib clinic, 05/30/18. He reports that he is doing well staying in rhythm, compliant with Tikosyn.   F/u in afib clinic 01/26/19 for return of afib last week. He feels the stressor for afib is that his brother had sudden death in the last few weeks presumed to be from an MI.His mother was then in the hospital for chest pain but no MI. He continues on Tikosyn. He would prefer a cardioversion instead of proceeding to ablation. No missed anticoagulation.   F/u in afib clinic 10/19. He had successful DCCV and is reaming in SR. He still is  pending  an appointment with Dr. Salena Saner or his team for further evaluation of possible CAD with brother's recent sudden death thought 2/2 CAD.   F/u in afib clinic, 08/10/19. He remains in SR. No afib to report. He has had his covid shots. Taking dofetilide and eliquis without issues. CHA2DS2VASc score of  F/u in afib clinic, 4/ 29/21, he unfortunately went back into afib that pm  after being seen here and was in SR. His BB was increased, which has resulted with  him going back  into rhythm in the past, however this time, he remains in rate controlled afib. No missed anticoagulation. He has had both covid shots.  Follow up in the AF clinic 11/24/19. Patient felt he was back in afib on 11/17/19 with symptoms of fatigue and palpitations. Confirmed by Lourena SimmondsKardia. He increased his BB with better control of his heart rates. There were no specific triggers that he could identify.   Today, he denies symptoms of chest pain, orthopnea, PND, lower extremity edema, dizziness, presyncope, syncope, or neurologic sequela.  The patient is tolerating medications without  difficulties and is otherwise without complaint today.   Past Medical History:  Diagnosis Date   Arthritis    "left knee; probably in the shoulders; left hip" (02/05/2017)   Chronic bronchitis (HCC)    "get it ~ q other year" (02/05/2017)   Chronic urticaria    CKD (chronic kidney disease), stage II    pt denies this hx on 02/05/2017   Complication of anesthesia    pt states awaken during colonscopy    Dysrhythmia    a fib   Essential hypertension    History of blood transfusion 1961   History of common duodenal ulcer 1980   History of kidney stones    Lymphadenopathy    a. evaluated by heme-onc 04/2016, under surveillance.   OSA on CPAP dx'd ~ 09/2016   Persistent atrial fibrillation (HCC)    Pneumonia    "X2" (02/05/2017)   Pure hypercholesterolemia    Past Surgical History:  Procedure Laterality Date   ANTERIOR CERVICAL DECOMP/DISCECTOMY FUSION  04/2005   "used piece from my hip"   CARDIOVERSION N/A 01/14/2017   Procedure: CARDIOVERSION;  Surgeon: Jake BatheSkains, Jernard C, MD;  [facility];  Service: Cardiovascular;  Laterality: N/A;   CARDIOVERSION N/A 02/07/2017   Procedure: CARDIOVERSION;  Surgeon: Chilton Siandolph, Tiffany, MD;  [facility];  Service: Cardiovascular;  Laterality: N/A;   CARDIOVERSION N/A 02/10/2018   Procedure: CARDIOVERSION;  Surgeon: Thurmon Fairroitoru, Mihai, MD;  Location: [facility] ENDOSCOPY;  Service: Cardiovascular;  Laterality: N/A;   CARDIOVERSION N/A 02/02/2019   Procedure: CARDIOVERSION;  Surgeon: Lars MassonNelson, Katarina H, MD;  [facility];  Service: Cardiovascular;  Laterality: N/A;   CARDIOVERSION N/A 08/26/2019   Procedure: CARDIOVERSION;  Surgeon: Nahser, Deloris PingPhilip J, MD;  [facility];  Service: Cardiovascular;  Laterality: N/A;   COLONOSCOPY W/ BIOPSIES AND POLYPECTOMY  ~ 2012   "benign"   ELBOW SURGERY Right    "Tommy John surgery"   EXTRACORPOREAL SHOCK WAVE LITHOTRIPSY  1990s   INGUINAL HERNIA REPAIR Right  1980   JOINT REPLACEMENT     left knee   KNEE ARTHROSCOPY Right ~ 1990   SHOULDER ARTHROSCOPY W/ ROTATOR CUFF REPAIR Right 2000s X 2   TONSILLECTOMY     "I think I did"   TOTAL HIP ARTHROPLASTY Left 06/04/2017   Procedure: LEFT TOTAL HIP ARTHROPLASTY ANTERIOR APPROACH;  Surgeon: Durene Romanslin, Matthew, MD;  Location: [facility] ORS;  Service: Orthopedics;  Laterality: Left;   TOTAL KNEE ARTHROPLASTY Left 02/07/2015   Procedure: LEFT TOTAL KNEE ARTHROPLASTY;  Surgeon: Durene RomansMatthew Olin, MD;  Location: [facility] ORS;  Service: Orthopedics;  Laterality: Left;    Current Outpatient Medications  Medication Sig Dispense Refill   albuterol (PROVENTIL HFA;VENTOLIN HFA) 108 (90 Base) MCG/ACT inhaler Inhale 1-2 puffs into the lungs as needed for wheezing or shortness of breath.  atorvastatin (LIPITOR) 40 MG tablet Take 40 mg by mouth every evening.     clindamycin (CLEOCIN) 300 MG capsule Take 900 mg by mouth See admin instructions. Take 3 capsules (900 mg) by mouth 1 hour prior to procedures.     dofetilide (TIKOSYN) 500 MCG capsule TAKE 1 CAPSULE BY MOUTH TWICE A DAY 60 capsule 6   DUEXIS 800-26.6 MG TABS Take 1 tablet by mouth as needed (pain.).      ELIQUIS 5 MG TABS tablet TAKE 1 TABLET TWICE A DAY (Patient taking differently: Take 5 mg by mouth in the morning and at bedtime. ) 180 tablet 2   EPIPEN 2-PAK 0.3 MG/0.3ML SOAJ injection Inject 0.3 mg into the muscle as needed for anaphylaxis.   3   famotidine (PEPCID) 40 MG tablet Take 40 mg by mouth 2 (two) times daily.     fluticasone (FLONASE) 50 MCG/ACT nasal spray Place 1 spray into both nostrils daily as needed for allergies.     KLOR-CON M20 20 MEQ tablet TAKE 1 TABLET TWICE A DAY (Patient taking differently: Take 20 mEq by mouth in the morning and at bedtime. ) 180 tablet 3   losartan (COZAAR) 100 MG tablet Take 100 mg by mouth daily.      methocarbamol (ROBAXIN) 500 MG tablet Take 1 tablet (500 mg total) by mouth every 6 (six) hours as needed for  muscle spasms. (Patient taking differently: Take 500 mg by mouth as needed for muscle spasms. ) 40 tablet 0   metoprolol tartrate (LOPRESSOR) 25 MG tablet Take 50 mg by mouth in the morning and at bedtime.      nystatin (MYCOSTATIN/NYSTOP) powder Apply 1 Bottle topically 2 (two) times daily as needed (skin irritation.).      spironolactone (ALDACTONE) 25 MG tablet Take 1 tablet (25 mg total) by mouth daily. 90 tablet 3   tadalafil (ADCIRCA/CIALIS) 20 MG tablet Take 40-100 mg by mouth daily as needed for erectile dysfunction.     No current facility-administered medications for this encounter.    Allergies  Allergen Reactions   Contrast Media [Iodinated Diagnostic Agents] Shortness Of Breath and Palpitations    Broke out in sweats.   Penicillins Anaphylaxis    Has patient had a PCN reaction causing immediate rash, facial/tongue/throat swelling, SOB or lightheadedness with hypotension: Yes Has patient had a PCN reaction causing severe rash involving mucus membranes or skin necrosis: No Has patient had a PCN reaction that required hospitalization:Yes Has patient had a PCN reaction occurring within the last 10 years: No If all of the above answers are "NO", then may proceed with Cephalosporin use.    Singulair [Montelukast Sodium] Swelling    Lips, mouth, and hand swelling     Social History   Socioeconomic History   Marital status: Married    Spouse name: Not on file   Number of children: Not on file   Years of education: Not on file   Highest education level: Not on file  Occupational History   Not on file  Tobacco Use   Smoking status: Never Smoker   Smokeless tobacco: Never Used  Vaping Use   Vaping Use: Never used  Substance and Sexual Activity   Alcohol use: Yes    Comment: 02/05/2017 "2-4 glasses of wine/month"   Drug use: No   Sexual activity: Yes  Other Topics Concern   Not on file  Social History Narrative   Pt lives in [place].  Works as a  Tax adviser for  [employer].  Married with 4 children.   Social Determinants of Health   Financial Resource Strain:    Difficulty of Paying Living Expenses:   Food Insecurity:    Worried About Programme researcher, broadcasting/film/video in the Last Year:    Barista in the Last Year:   Transportation Needs:    Freight forwarder (Medical):    Lack of Transportation (Non-Medical):   Physical Activity:    Days of Exercise per Week:    Minutes of Exercise per Session:   Stress:    Feeling of Stress :   Social Connections:    Frequency of Communication with Friends and Family:    Frequency of Social Gatherings with Friends and Family:    Attends Religious Services:    Active Member of Clubs or Organizations:    Attends Engineer, structural:    Marital Status:   Intimate Partner Violence:    Fear of Current or Ex-Partner:    Emotionally Abused:    Physically Abused:    Sexually Abused:     Family History  Problem Relation Age of Onset   Hypertension Mother    Hyperlipidemia Mother    Breast cancer Mother    Uterine cancer Mother    Sleep apnea Mother    Hypertension Brother    Hyperlipidemia Brother    Cancer Brother     ROS- All systems are reviewed and negative except as per the HPI above  Physical Exam: Vitals:   11/24/19 1432  BP: 110/80  Pulse: 86  Weight: 135.4 kg  Height: 6\' 2"  (1.88 m)   Wt Readings from Last 3 Encounters:  11/24/19 135.4 kg  08/26/19 132.5 kg  08/20/19 134.4 kg    Labs: Lab Results  Component Value Date   NA 136 11/24/2019   K 4.9 11/24/2019   CL 103 11/24/2019   CO2 24 11/24/2019   GLUCOSE 95 11/24/2019   BUN 21 (H) 11/24/2019   CREATININE 1.20 11/24/2019   CALCIUM 9.2 11/24/2019   MG 2.1 11/24/2019   Lab Results  Component Value Date   INR 1.1 12/31/2016   No results found for: CHOL, HDL, LDLCALC, TRIG   GEN- The patient is well appearing obese male, alert and oriented x 3 today.   HEENT-head  normocephalic, atraumatic, sclera clear, conjunctiva pink, hearing intact, trachea midline. Lungs- Clear to ausculation bilaterally, normal work of breathing Heart- irregular rate and rhythm, no murmurs, rubs or gallops  GI- soft, NT, ND, + BS Extremities- no clubbing, cyanosis, or edema MS- no significant deformity or atrophy Skin- no rash or lesion Psych- euthymic mood, full affect Neuro- strength and sensation are intact   EKG-  Afib HR 86, PVC, QRS 102, QTc 478  Epic records reviewed  Echo-Study Conclusions  - Left ventricle: The cavity size was normal. There was mild   concentric hypertrophy. Systolic function was normal. The   estimated ejection fraction was in the range of 60% to 65%. Wall   motion was normal; there were no regional wall motion   abnormalities. - Aortic valve: Transvalvular velocity was within the normal range.   There was no stenosis. There was no regurgitation. - Aorta: Ascending aortic diameter: 38 mm (S). - Ascending aorta: The ascending aorta was mildly dilated. - Mitral valve: Transvalvular velocity was within the normal range.   There was no evidence for stenosis. There was trivial   regurgitation. - Left atrium: The atrium was severely dilated. - Right ventricle:  The cavity size was normal. Wall thickness was   normal. Systolic function was normal. - Right atrium: The atrium was severely dilated. - Tricuspid valve: There was trivial regurgitation.   Assessment and Plan: 1. Persistent symptomatic afib   Patient back in rate controlled afib. Had DCCV on 08/26/19. He appears to be failing flecainide.  We discussed therapeutic options today including ablation. Patient agreeable to referral to consider ablation. Will check echocardiogram to evaluate LA size.  Continue eliquis 5 mg bid  for chadsvasc score of 1 Continue metoprolol 50 mg BID Continue dofetilide 500 mcg BID. QT stable. Check bmet/mag today.     2. HTN Stable, no changes  today. Continue to hold flecainide with increased rate control for now.  3. OSA Patient reports compliance with CPAP therapy.  4. Obesity Body mass index is 38.31 kg/m. Lifestyle modification was discussed and encouraged including regular physical activity and weight reduction.    Follow up with Dr Johney Frame to consider ablation.    Jorja Loa PA-C Afib Clinic [facility] [address] [place], [place] [address] [phone]

## 2019-11-30 ENCOUNTER — Ambulatory Visit (HOSPITAL_COMMUNITY)
Admission: RE | Admit: 2019-11-30 | Discharge: 2019-11-30 | Disposition: A | Discharge status: Home / Self Care | Payer: Managed Care, Other (non HMO) | Source: Ambulatory Visit | Attending: Family Medicine | Admitting: Family Medicine

## 2019-11-30 ENCOUNTER — Other Ambulatory Visit: Payer: Self-pay

## 2019-11-30 DIAGNOSIS — I4819 Other persistent atrial fibrillation: Secondary | ICD-10-CM | POA: Diagnosis present

## 2019-11-30 DIAGNOSIS — I071 Rheumatic tricuspid insufficiency: Secondary | ICD-10-CM | POA: Insufficient documentation

## 2019-11-30 DIAGNOSIS — G4730 Sleep apnea, unspecified: Secondary | ICD-10-CM | POA: Insufficient documentation

## 2019-11-30 DIAGNOSIS — N189 Chronic kidney disease, unspecified: Secondary | ICD-10-CM | POA: Diagnosis not present

## 2019-11-30 DIAGNOSIS — I129 Hypertensive chronic kidney disease with stage 1 through stage 4 chronic kidney disease, or unspecified chronic kidney disease: Secondary | ICD-10-CM | POA: Diagnosis not present

## 2019-11-30 DIAGNOSIS — E785 Hyperlipidemia, unspecified: Secondary | ICD-10-CM | POA: Diagnosis not present

## 2019-11-30 NOTE — Progress Notes (Signed)
  Echocardiogram 2D Echocardiogram has been performed.  Garrett Hanson 11/30/2019, 9:55 AM

## 2019-12-01 ENCOUNTER — Encounter: Payer: Self-pay | Admitting: Internal Medicine

## 2019-12-01 ENCOUNTER — Ambulatory Visit (INDEPENDENT_AMBULATORY_CARE_PROVIDER_SITE_OTHER): Payer: Managed Care, Other (non HMO) | Admitting: Internal Medicine

## 2019-12-01 VITALS — BP 120/90 | HR 87 | Ht 74.0 in | Wt 298.0 lb

## 2019-12-01 DIAGNOSIS — I10 Essential (primary) hypertension: Secondary | ICD-10-CM | POA: Diagnosis not present

## 2019-12-01 DIAGNOSIS — G4733 Obstructive sleep apnea (adult) (pediatric): Secondary | ICD-10-CM | POA: Diagnosis not present

## 2019-12-01 DIAGNOSIS — I4891 Unspecified atrial fibrillation: Secondary | ICD-10-CM | POA: Diagnosis not present

## 2019-12-01 DIAGNOSIS — I4819 Other persistent atrial fibrillation: Secondary | ICD-10-CM | POA: Diagnosis not present

## 2019-12-01 LAB — ECHOCARDIOGRAM COMPLETE
Area-P 1/2: 2.72 cm2
S' Lateral: 3 cm

## 2019-12-01 MED ORDER — PREDNISONE 50 MG PO TABS
ORAL_TABLET | ORAL | 0 refills | Status: DC
Start: 2019-12-01 — End: 2019-12-22

## 2019-12-01 NOTE — H&P (View-Only) (Signed)
PCP: Daisy Floro, MD Primary Cardiologist: Dr Royann Shivers Primary EP: Dr Bobetta Lime is a 59 y.o. male who presents today for routine electrophysiology followup.  Since last being seen in our clinic, the patient reports doing reasonably well.  I saw him last in 2018 when he had tikosyn load.  He has been followed by Dr Royann Shivers and the AF clinic since that time.  He has returned to persistent afib.  He has symptoms of fatigue and decreased exercise tolerance.  Today, he denies symptoms of  chest pain, shortness of breath,  lower extremity edema, dizziness, presyncope, or syncope.  The patient is otherwise without complaint today.   Past Medical History:  Diagnosis Date  . Arthritis    "left knee; probably in the shoulders; left hip" (02/05/2017)  . Chronic bronchitis (HCC)    "get it ~ q other year" (02/05/2017)  . Chronic urticaria   . CKD (chronic kidney disease), stage II    pt denies this hx on 02/05/2017  . Complication of anesthesia    pt states awaken during colonscopy   . Dysrhythmia    a fib  . Essential hypertension   . History of blood transfusion 1961  . History of common duodenal ulcer 1980  . History of kidney stones   . Lymphadenopathy    a. evaluated by heme-onc 04/2016, under surveillance.  . OSA on CPAP dx'd ~ 09/2016  . Persistent atrial fibrillation (HCC)   . Pneumonia    "X2" (02/05/2017)  . Pure hypercholesterolemia    Past Surgical History:  Procedure Laterality Date  . ANTERIOR CERVICAL DECOMP/DISCECTOMY FUSION  04/2005   "used piece from my hip"  . CARDIOVERSION N/A 01/14/2017   Procedure: CARDIOVERSION;  Surgeon: Jake Bathe, MD;  [facility] ENDOSCOPY;  Service: Cardiovascular;  Laterality: N/A;  . CARDIOVERSION N/A 02/07/2017   Procedure: CARDIOVERSION;  Surgeon: Chilton Si, MD;  Location: [facility] ENDOSCOPY;  Service: Cardiovascular;  Laterality: N/A;  . CARDIOVERSION N/A 02/10/2018   Procedure: CARDIOVERSION;  Surgeon:  Thurmon Fair, MD;  Location: [facility] ENDOSCOPY;  Service: Cardiovascular;  Laterality: N/A;  . CARDIOVERSION N/A 02/02/2019   Procedure: CARDIOVERSION;  Surgeon: Lars Masson, MD;  [facility];  Service: Cardiovascular;  Laterality: N/A;  . CARDIOVERSION N/A 08/26/2019   Procedure: CARDIOVERSION;  Surgeon: Elease Hashimoto Deloris Ping, MD;  Location: [facility] ENDOSCOPY;  Service: Cardiovascular;  Laterality: N/A;  . COLONOSCOPY W/ BIOPSIES AND POLYPECTOMY  ~ 2012   "benign"  . ELBOW SURGERY Right    "Tommy John surgery"  . EXTRACORPOREAL SHOCK WAVE LITHOTRIPSY  1990s  . INGUINAL HERNIA REPAIR Right 1980  . JOINT REPLACEMENT     left knee  . KNEE ARTHROSCOPY Right ~ 1990  . SHOULDER ARTHROSCOPY W/ ROTATOR CUFF REPAIR Right 2000s X 2  . TONSILLECTOMY     "I think I did"  . TOTAL HIP ARTHROPLASTY Left 06/04/2017   Procedure: LEFT TOTAL HIP ARTHROPLASTY ANTERIOR APPROACH;  Surgeon: Durene Romans, MD;  Location: [facility] ORS;  Service: Orthopedics;  Laterality: Left;  . TOTAL KNEE ARTHROPLASTY Left 02/07/2015   Procedure: LEFT TOTAL KNEE ARTHROPLASTY;  Surgeon: Durene Romans, MD;  Location: [facility] ORS;  Service: Orthopedics;  Laterality: Left;    ROS- all systems are reviewed and negatives except as per HPI above  Current Outpatient Medications  Medication Sig Dispense Refill  . albuterol (PROVENTIL HFA;VENTOLIN HFA) 108 (90 Base) MCG/ACT inhaler Inhale 1-2 puffs into the lungs as needed for wheezing or shortness  of breath.     Marland Kitchen atorvastatin (LIPITOR) 40 MG tablet Take 40 mg by mouth every evening.    . clindamycin (CLEOCIN) 300 MG capsule Take 900 mg by mouth See admin instructions. Take 3 capsules (900 mg) by mouth 1 hour prior to procedures.    . dofetilide (TIKOSYN) 500 MCG capsule TAKE 1 CAPSULE BY MOUTH TWICE A DAY 60 capsule 6  . DUEXIS 800-26.6 MG TABS Take 1 tablet by mouth as needed (pain.).     Marland Kitchen ELIQUIS 5 MG TABS tablet TAKE 1 TABLET TWICE A DAY (Patient taking differently: Take 5 mg by mouth  in the morning and at bedtime. ) 180 tablet 2  . EPIPEN 2-PAK 0.3 MG/0.3ML SOAJ injection Inject 0.3 mg into the muscle as needed for anaphylaxis.   3  . famotidine (PEPCID) 40 MG tablet Take 40 mg by mouth 2 (two) times daily.    . fluticasone (FLONASE) 50 MCG/ACT nasal spray Place 1 spray into both nostrils daily as needed for allergies.    Marland Kitchen KLOR-CON M20 20 MEQ tablet TAKE 1 TABLET TWICE A DAY (Patient taking differently: Take 20 mEq by mouth in the morning and at bedtime. ) 180 tablet 3  . losartan (COZAAR) 100 MG tablet Take 100 mg by mouth daily.     . methocarbamol (ROBAXIN) 500 MG tablet Take 1 tablet (500 mg total) by mouth every 6 (six) hours as needed for muscle spasms. (Patient taking differently: Take 500 mg by mouth as needed for muscle spasms. ) 40 tablet 0  . metoprolol tartrate (LOPRESSOR) 25 MG tablet Take 50 mg by mouth in the morning and at bedtime.     Marland Kitchen nystatin (MYCOSTATIN/NYSTOP) powder Apply 1 Bottle topically 2 (two) times daily as needed (skin irritation.).     Marland Kitchen spironolactone (ALDACTONE) 25 MG tablet Take 1 tablet (25 mg total) by mouth daily. 90 tablet 3  . tadalafil (ADCIRCA/CIALIS) 20 MG tablet Take 40-100 mg by mouth daily as needed for erectile dysfunction.     No current facility-administered medications for this visit.    Physical Exam: Vitals:   12/01/19 1521  BP: 120/90  Pulse: 87  SpO2: 98%  Weight: 298 lb (135.2 kg)  Height: 6\' 2"  (1.88 m)    GEN- The patient is overweight appearing, alert and oriented x 3 today.   Head- normocephalic, atraumatic Eyes-  Sclera clear, conjunctiva pink Ears- hearing intact Oropharynx- clear Lungs-  normal work of breathing Heart- irregular rate and rhythm  GI- soft  Extremities- no clubbing, cyanosis, or edema  Wt Readings from Last 3 Encounters:  12/01/19 298 lb (135.2 kg)  11/24/19 298 lb 6.4 oz (135.4 kg)  08/26/19 292 lb (132.5 kg)   Echo reveals EF 60%, moderate LA enlargement  EKG tracing ordered  today is personally reviewed and shows afib  Assessment and Plan:  1. Persistent atrial fibrillation The patient has symptomatic, recurrent persistent atrial fibrillation. he has failed medical therapy with tikosyn. Chads2vasc score is 1.  he is anticoagulated with eliquis . Therapeutic strategies for afib including medicine and ablation were discussed in detail with the patient today. Risk, benefits, and alternatives to EP study and radiofrequency ablation for afib were also discussed in detail today. These risks include but are not limited to stroke, bleeding, vascular damage, tamponade, perforation, damage to the esophagus, lungs, and other structures, pulmonary vein stenosis, worsening renal function, and death. The patient understands these risk and wishes to proceed.  We will therefore proceed with  catheter ablation at the next available time.  Carto, ICE, anesthesia are requested for the procedure.  Will also obtain cardiac CT prior to the procedure to exclude LAA thrombus and further evaluate atrial anatomy.  2. Morbid obesity Body mass index is 38.26 kg/m. I have reviewed the patients BMI and decreased success rates with ablation at length today.  Weight loss is strongly advised.  Per Guijian et al (PACE 2013; 36: 387-564), patients with BMI 25-29.9 (obese) have a 27% increase in AF recurrence post ablation.  Patients with BMI >30 have a 31% increase in AF recurrence post ablation when compared to those with BMI <25.  3. HTN Stable No change required today  4. OSA Compliance with cPAP advised  Risks, benefits and potential toxicities for medications prescribed and/or refilled reviewed with patient today.   Hillis Range MD, [facility] 12/01/2019 3:43 PM

## 2019-12-01 NOTE — Patient Instructions (Addendum)
Medication Instructions:  Your physician recommends that you continue on your current medications as directed. Please refer to the Current Medication list given to you today.  Labwork: You will get lab work today:  CBC  Testing/Procedures: None ordered.  Follow-Up:  SEE INSTRUCTION LETTER  Any Other Special Instructions Will Be Listed Below (If Applicable).  If you need a refill on your cardiac medications before your next appointment, please call your pharmacy.    Cardiac Ablation Cardiac ablation is a procedure to disable (ablate) a small amount of heart tissue in very specific places. The heart has many electrical connections. Sometimes these connections are abnormal and can cause the heart to beat very fast or irregularly. Ablating some of the problem areas can improve the heart rhythm or return it to normal. Ablation may be done for people who:  Have Wolff-Parkinson-White syndrome.  Have fast heart rhythms (tachycardia).  Have taken medicines for an abnormal heart rhythm (arrhythmia) that were not effective or caused side effects.  Have a high-risk heartbeat that may be life-threatening. During the procedure, a small incision is made in the neck or the groin, and a long, thin, flexible tube (catheter) is inserted into the incision and moved to the heart. Small devices (electrodes) on the tip of the catheter will send out electrical currents. A type of X-ray (fluoroscopy) will be used to help guide the catheter and to provide images of the heart. Tell a health care provider about:  Any allergies you have.  All medicines you are taking, including vitamins, herbs, eye drops, creams, and over-the-counter medicines.  Any problems you or family members have had with anesthetic medicines.  Any blood disorders you have.  Any surgeries you have had.  Any medical conditions you have, such as kidney failure.  Whether you are pregnant or may be pregnant. What are the  risks? Generally, this is a safe procedure. However, problems may occur, including:  Infection.  Bruising and bleeding at the catheter insertion site.  Bleeding into the chest, especially into the sac that surrounds the heart. This is a serious complication.  Stroke or blood clots.  Damage to other structures or organs.  Allergic reaction to medicines or dyes.  Need for a permanent pacemaker if the normal electrical system is damaged. A pacemaker is a small computer that sends electrical signals to the heart and helps your heart beat normally.  The procedure not being fully effective. This may not be recognized until months later. Repeat ablation procedures are sometimes required. What happens before the procedure?  Follow instructions from your health care provider about eating or drinking restrictions.  Ask your health care provider about: ? Changing or stopping your regular medicines. This is especially important if you are taking diabetes medicines or blood thinners. ? Taking medicines such as aspirin and ibuprofen. These medicines can thin your blood. Do not take these medicines before your procedure if your health care provider instructs you not to.  Plan to have someone take you home from the hospital or clinic.  If you will be going home right after the procedure, plan to have someone with you for 24 hours. What happens during the procedure?  To lower your risk of infection: ? Your health care team will wash or sanitize their hands. ? Your skin will be washed with soap. ? Hair may be removed from the incision area.  An IV tube will be inserted into one of your veins.  You will be given a medicine to  help you relax (sedative).  The skin on your neck or groin will be numbed.  An incision will be made in your neck or your groin.  A needle will be inserted through the incision and into a large vein in your neck or groin.  A catheter will be inserted into the needle  and moved to your heart.  Dye may be injected through the catheter to help your surgeon see the area of the heart that needs treatment.  Electrical currents will be sent from the catheter to ablate heart tissue in desired areas. There are three types of energy that may be used to ablate heart tissue: ? Heat (radiofrequency energy). ? Laser energy. ? Extreme cold (cryoablation).  When the necessary tissue has been ablated, the catheter will be removed.  Pressure will be held on the catheter insertion area to prevent excessive bleeding.  A bandage (dressing) will be placed over the catheter insertion area. The procedure may vary among health care providers and hospitals. What happens after the procedure?  Your blood pressure, heart rate, breathing rate, and blood oxygen level will be monitored until the medicines you were given have worn off.  Your catheter insertion area will be monitored for bleeding. You will need to lie still for a few hours to ensure that you do not bleed from the catheter insertion area.  Do not drive for 24 hours or as long as directed by your health care provider. Summary  Cardiac ablation is a procedure to disable (ablate) a small amount of heart tissue in very specific places. Ablating some of the problem areas can improve the heart rhythm or return it to normal.  During the procedure, electrical currents will be sent from the catheter to ablate heart tissue in desired areas. This information is not intended to replace advice given to you by your health care provider. Make sure you discuss any questions you have with your health care provider. Document Revised: 09/30/2017 Document Reviewed: 02/27/2016 Elsevier Patient Education  2020 [employer].

## 2019-12-01 NOTE — Progress Notes (Signed)
 PCP: Ross, Charles Alan, MD Primary Cardiologist: Dr Croitoru Primary EP: Dr Shameek Nyquist  Garrett Hanson is a 59 y.o. male who presents today for routine electrophysiology followup.  Since last being seen in our clinic, the patient reports doing reasonably well.  I saw him last in 2018 when he had tikosyn load.  He has been followed by Dr Croitoru and the AF clinic since that time.  He has returned to persistent afib.  He has symptoms of fatigue and decreased exercise tolerance.  Today, he denies symptoms of  chest pain, shortness of breath,  lower extremity edema, dizziness, presyncope, or syncope.  The patient is otherwise without complaint today.   Past Medical History:  Diagnosis Date  . Arthritis    "left knee; probably in the shoulders; left hip" (02/05/2017)  . Chronic bronchitis (HCC)    "get it ~ q other year" (02/05/2017)  . Chronic urticaria   . CKD (chronic kidney disease), stage II    pt denies this hx on 02/05/2017  . Complication of anesthesia    pt states awaken during colonscopy   . Dysrhythmia    a fib  . Essential hypertension   . History of blood transfusion 1961  . History of common duodenal ulcer 1980  . History of kidney stones   . Lymphadenopathy    a. evaluated by heme-onc 04/2016, under surveillance.  . OSA on CPAP dx'd ~ 09/2016  . Persistent atrial fibrillation (HCC)   . Pneumonia    "X2" (02/05/2017)  . Pure hypercholesterolemia    Past Surgical History:  Procedure Laterality Date  . ANTERIOR CERVICAL DECOMP/DISCECTOMY FUSION  04/2005   "used piece from my hip"  . CARDIOVERSION N/A 01/14/2017   Procedure: CARDIOVERSION;  Surgeon: Skains, Corbin C, MD;  Location: [facility];  Service: Cardiovascular;  Laterality: N/A;  . CARDIOVERSION N/A 02/07/2017   Procedure: CARDIOVERSION;  Surgeon: Kwethluk, Tiffany, MD;  Location: [facility];  Service: Cardiovascular;  Laterality: N/A;  . CARDIOVERSION N/A 02/10/2018   Procedure: CARDIOVERSION;  Surgeon:  Croitoru, Mihai, MD;  Location: [facility];  Service: Cardiovascular;  Laterality: N/A;  . CARDIOVERSION N/A 02/02/2019   Procedure: CARDIOVERSION;  Surgeon: Nelson, Katarina H, MD;  Location: [facility];  Service: Cardiovascular;  Laterality: N/A;  . CARDIOVERSION N/A 08/26/2019   Procedure: CARDIOVERSION;  Surgeon: Nahser, Philip J, MD;  Location: [facility];  Service: Cardiovascular;  Laterality: N/A;  . COLONOSCOPY W/ BIOPSIES AND POLYPECTOMY  ~ 2012   "benign"  . ELBOW SURGERY Right    "Tommy John surgery"  . EXTRACORPOREAL SHOCK WAVE LITHOTRIPSY  1990s  . INGUINAL HERNIA REPAIR Right 1980  . JOINT REPLACEMENT     left knee  . KNEE ARTHROSCOPY Right ~ 1990  . SHOULDER ARTHROSCOPY W/ ROTATOR CUFF REPAIR Right 2000s X 2  . TONSILLECTOMY     "I think I did"  . TOTAL HIP ARTHROPLASTY Left 06/04/2017   Procedure: LEFT TOTAL HIP ARTHROPLASTY ANTERIOR APPROACH;  Surgeon: Olin, Matthew, MD;  Location: [facility];  Service: Orthopedics;  Laterality: Left;  . TOTAL KNEE ARTHROPLASTY Left 02/07/2015   Procedure: LEFT TOTAL KNEE ARTHROPLASTY;  Surgeon: Matthew Olin, MD;  Location: [facility];  Service: Orthopedics;  Laterality: Left;    ROS- all systems are reviewed and negatives except as per HPI above  Current Outpatient Medications  Medication Sig Dispense Refill  . albuterol (PROVENTIL HFA;VENTOLIN HFA) 108 (90 Base) MCG/ACT inhaler Inhale 1-2 puffs into the lungs as needed for wheezing or shortness   of breath.     Marland Kitchen atorvastatin (LIPITOR) 40 MG tablet Take 40 mg by mouth every evening.    . clindamycin (CLEOCIN) 300 MG capsule Take 900 mg by mouth See admin instructions. Take 3 capsules (900 mg) by mouth 1 hour prior to procedures.    . dofetilide (TIKOSYN) 500 MCG capsule TAKE 1 CAPSULE BY MOUTH TWICE A DAY 60 capsule 6  . DUEXIS 800-26.6 MG TABS Take 1 tablet by mouth as needed (pain.).     Marland Kitchen ELIQUIS 5 MG TABS tablet TAKE 1 TABLET TWICE A DAY (Patient taking differently: Take 5 mg by mouth  in the morning and at bedtime. ) 180 tablet 2  . EPIPEN 2-PAK 0.3 MG/0.3ML SOAJ injection Inject 0.3 mg into the muscle as needed for anaphylaxis.   3  . famotidine (PEPCID) 40 MG tablet Take 40 mg by mouth 2 (two) times daily.    . fluticasone (FLONASE) 50 MCG/ACT nasal spray Place 1 spray into both nostrils daily as needed for allergies.    Marland Kitchen KLOR-CON M20 20 MEQ tablet TAKE 1 TABLET TWICE A DAY (Patient taking differently: Take 20 mEq by mouth in the morning and at bedtime. ) 180 tablet 3  . losartan (COZAAR) 100 MG tablet Take 100 mg by mouth daily.     . methocarbamol (ROBAXIN) 500 MG tablet Take 1 tablet (500 mg total) by mouth every 6 (six) hours as needed for muscle spasms. (Patient taking differently: Take 500 mg by mouth as needed for muscle spasms. ) 40 tablet 0  . metoprolol tartrate (LOPRESSOR) 25 MG tablet Take 50 mg by mouth in the morning and at bedtime.     Marland Kitchen nystatin (MYCOSTATIN/NYSTOP) powder Apply 1 Bottle topically 2 (two) times daily as needed (skin irritation.).     Marland Kitchen spironolactone (ALDACTONE) 25 MG tablet Take 1 tablet (25 mg total) by mouth daily. 90 tablet 3  . tadalafil (ADCIRCA/CIALIS) 20 MG tablet Take 40-100 mg by mouth daily as needed for erectile dysfunction.     No current facility-administered medications for this visit.    Physical Exam: Vitals:   12/01/19 1521  BP: 120/90  Pulse: 87  SpO2: 98%  Weight: 298 lb (135.2 kg)  Height: 6\' 2"  (1.88 m)    GEN- The patient is overweight appearing, alert and oriented x 3 today.   Head- normocephalic, atraumatic Eyes-  Sclera clear, conjunctiva pink Ears- hearing intact Oropharynx- clear Lungs-  normal work of breathing Heart- irregular rate and rhythm  GI- soft  Extremities- no clubbing, cyanosis, or edema  Wt Readings from Last 3 Encounters:  12/01/19 298 lb (135.2 kg)  11/24/19 298 lb 6.4 oz (135.4 kg)  08/26/19 292 lb (132.5 kg)   Echo reveals EF 60%, moderate LA enlargement  EKG tracing ordered  today is personally reviewed and shows afib  Assessment and Plan:  1. Persistent atrial fibrillation The patient has symptomatic, recurrent persistent atrial fibrillation. he has failed medical therapy with tikosyn. Chads2vasc score is 1.  he is anticoagulated with eliquis . Therapeutic strategies for afib including medicine and ablation were discussed in detail with the patient today. Risk, benefits, and alternatives to EP study and radiofrequency ablation for afib were also discussed in detail today. These risks include but are not limited to stroke, bleeding, vascular damage, tamponade, perforation, damage to the esophagus, lungs, and other structures, pulmonary vein stenosis, worsening renal function, and death. The patient understands these risk and wishes to proceed.  We will therefore proceed with  catheter ablation at the next available time.  Carto, ICE, anesthesia are requested for the procedure.  Will also obtain cardiac CT prior to the procedure to exclude LAA thrombus and further evaluate atrial anatomy.  2. Morbid obesity Body mass index is 38.26 kg/m. I have reviewed the patients BMI and decreased success rates with ablation at length today.  Weight loss is strongly advised.  Per Guijian et al (PACE 2013; 36: 387-564), patients with BMI 25-29.9 (obese) have a 27% increase in AF recurrence post ablation.  Patients with BMI >30 have a 31% increase in AF recurrence post ablation when compared to those with BMI <25.  3. HTN Stable No change required today  4. OSA Compliance with cPAP advised  Risks, benefits and potential toxicities for medications prescribed and/or refilled reviewed with patient today.   Hillis Range MD, [facility] 12/01/2019 3:43 PM

## 2019-12-02 ENCOUNTER — Telehealth: Payer: Self-pay | Admitting: Internal Medicine

## 2019-12-02 LAB — CBC WITH DIFFERENTIAL/PLATELET
Basophils Absolute: 0.1 10*3/uL (ref 0.0–0.2)
Basos: 1 %
EOS (ABSOLUTE): 0.5 10*3/uL — ABNORMAL HIGH (ref 0.0–0.4)
Eos: 6 %
Hematocrit: 48.3 % (ref 37.5–51.0)
Hemoglobin: 16 g/dL (ref 13.0–17.7)
Immature Grans (Abs): 0 10*3/uL (ref 0.0–0.1)
Immature Granulocytes: 1 %
Lymphocytes Absolute: 2.3 10*3/uL (ref 0.7–3.1)
Lymphs: 27 %
MCH: 29.9 pg (ref 26.6–33.0)
MCHC: 33.1 g/dL (ref 31.5–35.7)
MCV: 90 fL (ref 79–97)
Monocytes Absolute: 0.8 10*3/uL (ref 0.1–0.9)
Monocytes: 10 %
Neutrophils Absolute: 4.7 10*3/uL (ref 1.4–7.0)
Neutrophils: 55 %
Platelets: 224 10*3/uL (ref 150–450)
RBC: 5.35 x10E6/uL (ref 4.14–5.80)
RDW: 13.1 % (ref 11.6–15.4)
WBC: 8.4 10*3/uL (ref 3.4–10.8)

## 2019-12-02 NOTE — Telephone Encounter (Signed)
New message   Called pt to schedule ablation f/u, pt said he has some questions he wants answered before moving forward with the procedure or scheduling.

## 2019-12-03 NOTE — Telephone Encounter (Signed)
Answered all questions about the up coming procedure and patient verbalized understanding.  Will have [facility] call patient to schedule his appts at his request.

## 2019-12-14 ENCOUNTER — Telehealth (HOSPITAL_COMMUNITY): Payer: Self-pay | Admitting: Emergency Medicine

## 2019-12-14 NOTE — Telephone Encounter (Signed)
Attempted to call patient regarding upcoming cardiac CT appointment. °Left message on voicemail with name and callback number °Jenetta Wease RN Navigator Cardiac Imaging °[facility] °[phone] Office °[phone] Cell ° °

## 2019-12-15 ENCOUNTER — Ambulatory Visit (HOSPITAL_COMMUNITY)
Admission: RE | Admit: 2019-12-15 | Discharge: 2019-12-15 | Disposition: A | Discharge status: Home / Self Care | Payer: Managed Care, Other (non HMO) | Source: Ambulatory Visit | Attending: Internal Medicine | Admitting: Internal Medicine

## 2019-12-15 ENCOUNTER — Other Ambulatory Visit: Payer: Self-pay

## 2019-12-15 DIAGNOSIS — I4891 Unspecified atrial fibrillation: Secondary | ICD-10-CM | POA: Diagnosis not present

## 2019-12-15 MED ORDER — IOHEXOL 350 MG/ML SOLN
80.0000 mL | Freq: Once | INTRAVENOUS | Status: AC | PRN
  Administered 2019-12-15: 80 mL via INTRAVENOUS

## 2019-12-19 ENCOUNTER — Other Ambulatory Visit (HOSPITAL_COMMUNITY)
Admission: RE | Admit: 2019-12-19 | Discharge: 2019-12-19 | Disposition: A | Discharge status: Home / Self Care | Payer: Managed Care, Other (non HMO) | Source: Ambulatory Visit | Attending: Internal Medicine | Admitting: Internal Medicine

## 2019-12-19 DIAGNOSIS — Z01812 Encounter for preprocedural laboratory examination: Secondary | ICD-10-CM | POA: Insufficient documentation

## 2019-12-19 DIAGNOSIS — Z20822 Contact with and (suspected) exposure to covid-19: Secondary | ICD-10-CM | POA: Diagnosis not present

## 2019-12-19 LAB — SARS CORONAVIRUS 2 (TAT 6-24 HRS): SARS Coronavirus 2: NEGATIVE

## 2019-12-21 NOTE — Progress Notes (Signed)
Instructed patient on the following items: Arrival time 0730 Nothing to eat or drink after midnight No meds AM of procedure Responsible person to drive you home and stay with you for 24 hrs  Have you missed any doses of anti-coagulant Eliquis, no doses missed

## 2019-12-22 ENCOUNTER — Ambulatory Visit (HOSPITAL_COMMUNITY): Payer: Managed Care, Other (non HMO) | Admitting: Certified Registered Nurse Anesthetist

## 2019-12-22 ENCOUNTER — Other Ambulatory Visit: Payer: Self-pay

## 2019-12-22 ENCOUNTER — Ambulatory Visit (HOSPITAL_COMMUNITY)
Admission: RE | Disposition: A | Discharge status: Home / Self Care | Payer: Self-pay | Source: Home / Self Care | Attending: Internal Medicine

## 2019-12-22 ENCOUNTER — Ambulatory Visit (HOSPITAL_COMMUNITY)
Admission: RE | Admit: 2019-12-22 | Discharge: 2019-12-22 | Disposition: A | Discharge status: Home / Self Care | Payer: Managed Care, Other (non HMO) | Attending: Internal Medicine | Admitting: Internal Medicine

## 2019-12-22 DIAGNOSIS — E7800 Pure hypercholesterolemia, unspecified: Secondary | ICD-10-CM | POA: Diagnosis not present

## 2019-12-22 DIAGNOSIS — Z7901 Long term (current) use of anticoagulants: Secondary | ICD-10-CM | POA: Insufficient documentation

## 2019-12-22 DIAGNOSIS — I4819 Other persistent atrial fibrillation: Secondary | ICD-10-CM | POA: Insufficient documentation

## 2019-12-22 DIAGNOSIS — Z6838 Body mass index (BMI) 38.0-38.9, adult: Secondary | ICD-10-CM | POA: Diagnosis not present

## 2019-12-22 DIAGNOSIS — N182 Chronic kidney disease, stage 2 (mild): Secondary | ICD-10-CM | POA: Insufficient documentation

## 2019-12-22 DIAGNOSIS — G4733 Obstructive sleep apnea (adult) (pediatric): Secondary | ICD-10-CM | POA: Diagnosis not present

## 2019-12-22 DIAGNOSIS — I129 Hypertensive chronic kidney disease with stage 1 through stage 4 chronic kidney disease, or unspecified chronic kidney disease: Secondary | ICD-10-CM | POA: Insufficient documentation

## 2019-12-22 DIAGNOSIS — Z79899 Other long term (current) drug therapy: Secondary | ICD-10-CM | POA: Insufficient documentation

## 2019-12-22 HISTORY — PX: ATRIAL FIBRILLATION ABLATION: EP1191

## 2019-12-22 SURGERY — ATRIAL FIBRILLATION ABLATION
Anesthesia: General

## 2019-12-22 MED ORDER — HEPARIN SODIUM (PORCINE) 1000 UNIT/ML IJ SOLN
INTRAMUSCULAR | Status: AC
  Filled 2019-12-22: qty 1

## 2019-12-22 MED ORDER — HEPARIN SODIUM (PORCINE) 1000 UNIT/ML IJ SOLN
INTRAMUSCULAR | Status: DC | PRN
  Administered 2019-12-22: 14000 [IU] via INTRAVENOUS

## 2019-12-22 MED ORDER — SODIUM CHLORIDE 0.9% FLUSH: 3.0000 mL | Freq: Two times a day (BID) | INTRAVENOUS | Status: DC

## 2019-12-22 MED ORDER — MIDAZOLAM HCL 2 MG/2ML IJ SOLN
INTRAMUSCULAR | Status: DC | PRN
  Administered 2019-12-22: 2 mg via INTRAVENOUS

## 2019-12-22 MED ORDER — SUGAMMADEX SODIUM 200 MG/2ML IV SOLN
INTRAVENOUS | Status: DC | PRN
  Administered 2019-12-22: 300 mg via INTRAVENOUS

## 2019-12-22 MED ORDER — ONDANSETRON HCL 4 MG/2ML IJ SOLN: 4.0000 mg | Freq: Four times a day (QID) | INTRAMUSCULAR | Status: DC | PRN

## 2019-12-22 MED ORDER — DEXMEDETOMIDINE (PRECEDEX) IN NS 20 MCG/5ML (4 MCG/ML) IV SYRINGE
PREFILLED_SYRINGE | INTRAVENOUS | Status: DC | PRN
  Administered 2019-12-22: 4 ug via INTRAVENOUS
  Administered 2019-12-22: 8 ug via INTRAVENOUS

## 2019-12-22 MED ORDER — ACETAMINOPHEN 325 MG PO TABS: 650.0000 mg | ORAL_TABLET | ORAL | Status: DC | PRN

## 2019-12-22 MED ORDER — PROTAMINE SULFATE 10 MG/ML IV SOLN
INTRAVENOUS | Status: DC | PRN
  Administered 2019-12-22: 40 mg via INTRAVENOUS

## 2019-12-22 MED ORDER — SODIUM CHLORIDE 0.9% FLUSH: 3.0000 mL | INTRAVENOUS | Status: DC | PRN

## 2019-12-22 MED ORDER — ROCURONIUM BROMIDE 10 MG/ML (PF) SYRINGE
PREFILLED_SYRINGE | INTRAVENOUS | Status: DC | PRN
  Administered 2019-12-22: 100 mg via INTRAVENOUS
  Administered 2019-12-22: 20 mg via INTRAVENOUS
  Administered 2019-12-22: 10 mg via INTRAVENOUS

## 2019-12-22 MED ORDER — HEPARIN (PORCINE) IN NACL 1000-0.9 UT/500ML-% IV SOLN
INTRAVENOUS | Status: AC
  Filled 2019-12-22: qty 500

## 2019-12-22 MED ORDER — PANTOPRAZOLE SODIUM 40 MG PO TBEC: 40.0000 mg | DELAYED_RELEASE_TABLET | Freq: Every day | ORAL | 0 refills | Status: DC

## 2019-12-22 MED ORDER — PROPOFOL 10 MG/ML IV BOLUS
INTRAVENOUS | Status: DC | PRN
  Administered 2019-12-22: 130 mg via INTRAVENOUS

## 2019-12-22 MED ORDER — LIDOCAINE 2% (20 MG/ML) 5 ML SYRINGE
INTRAMUSCULAR | Status: DC | PRN
  Administered 2019-12-22: 100 mg via INTRAVENOUS

## 2019-12-22 MED ORDER — PHENYLEPHRINE HCL-NACL 10-0.9 MG/250ML-% IV SOLN
INTRAVENOUS | Status: DC | PRN
  Administered 2019-12-22: 25 ug/min via INTRAVENOUS

## 2019-12-22 MED ORDER — FENTANYL CITRATE (PF) 250 MCG/5ML IJ SOLN
INTRAMUSCULAR | Status: DC | PRN
Start: 2019-12-22 — End: 2019-12-22
  Administered 2019-12-22 (×2): 50 ug via INTRAVENOUS

## 2019-12-22 MED ORDER — HEPARIN SODIUM (PORCINE) 1000 UNIT/ML IJ SOLN
INTRAMUSCULAR | Status: DC | PRN
  Administered 2019-12-22: 2000 [IU] via INTRAVENOUS
  Administered 2019-12-22 (×2): 3000 [IU] via INTRAVENOUS

## 2019-12-22 MED ORDER — DEXAMETHASONE SODIUM PHOSPHATE 10 MG/ML IJ SOLN
INTRAMUSCULAR | Status: DC | PRN
  Administered 2019-12-22: 10 mg via INTRAVENOUS

## 2019-12-22 MED ORDER — HYDROCODONE-ACETAMINOPHEN 5-325 MG PO TABS: 1.0000 | ORAL_TABLET | ORAL | Status: DC | PRN

## 2019-12-22 MED ORDER — HEPARIN (PORCINE) IN NACL 2000-0.9 UNIT/L-% IV SOLN
INTRAVENOUS | Status: DC | PRN
  Administered 2019-12-22: 1000 mL

## 2019-12-22 MED ORDER — ONDANSETRON HCL 4 MG/2ML IJ SOLN
INTRAMUSCULAR | Status: DC | PRN
  Administered 2019-12-22: 4 mg via INTRAVENOUS

## 2019-12-22 MED ORDER — ALBUTEROL SULFATE HFA 108 (90 BASE) MCG/ACT IN AERS
INHALATION_SPRAY | RESPIRATORY_TRACT | Status: DC | PRN
  Administered 2019-12-22: 2 via RESPIRATORY_TRACT

## 2019-12-22 MED ORDER — SODIUM CHLORIDE 0.9 % IV SOLN: INTRAVENOUS | Status: DC

## 2019-12-22 MED ORDER — SODIUM CHLORIDE 0.9 % IV SOLN: 250.0000 mL | INTRAVENOUS | Status: DC | PRN

## 2019-12-22 SURGICAL SUPPLY — 20 items
BLANKET WARM UNDERBOD FULL ACC (MISCELLANEOUS) ×2 IMPLANT
CATH MAPPNG PENTARAY F 2-6-2MM (CATHETERS) ×1 IMPLANT
CATH S CIRCA THERM PROBE 10F (CATHETERS) ×2 IMPLANT
CATH SMTCH THERMOCOOL SF DF (CATHETERS) ×2 IMPLANT
CATH SOUNDSTAR ECO 8FR (CATHETERS) ×2 IMPLANT
CATH WEBSTER BI DIR CS D-F CRV (CATHETERS) ×2 IMPLANT
COVER SWIFTLINK CONNECTOR (BAG) ×2 IMPLANT
DEVICE CLOSURE PERCLS PRGLD 6F (VASCULAR PRODUCTS) ×3 IMPLANT
PACK EP LATEX FREE (CUSTOM PROCEDURE TRAY) ×2
PACK EP LF (CUSTOM PROCEDURE TRAY) ×1 IMPLANT
PAD PRO RADIOLUCENT 2001M-C (PAD) ×2 IMPLANT
PATCH CARTO3 (PAD) ×2 IMPLANT
PENTARAY F 2-6-2MM (CATHETERS) ×2
PERCLOSE PROGLIDE 6F (VASCULAR PRODUCTS) ×6
SHEATH BAYLIS TRANSSEPTAL 98CM (NEEDLE) ×2 IMPLANT
SHEATH CARTO VIZIGO SM CVD (SHEATH) ×2 IMPLANT
SHEATH PINNACLE 7F 10CM (SHEATH) ×2 IMPLANT
SHEATH PINNACLE 8F 10CM (SHEATH) ×2 IMPLANT
SHEATH PINNACLE 9F 10CM (SHEATH) ×4 IMPLANT
TUBING SMART ABLATE COOLFLOW (TUBING) ×2 IMPLANT

## 2019-12-22 NOTE — Discharge Instructions (Signed)
Post procedure care instructions No driving for 4 days. No lifting over 5 lbs for 1 week. No vigorous or sexual activity for 1 week. You may return to work/your usual activities on 12/29/2019. Keep procedure site clean & dry. If you notice increased pain, swelling, bleeding or pus, call/return!  You may shower, but no soaking baths/hot tubs/pools for 1 week.     Cardiac Ablation, Care After  This sheet gives you information about how to care for yourself after your procedure. Your health care provider may also give you more specific instructions. If you have problems or questions, contact your health care provider. What can I expect after the procedure? After the procedure, it is common to have:  Bruising around your puncture site.  Tenderness around your puncture site.  Skipped heartbeats.  Tiredness (fatigue).  Follow these instructions at home: Puncture site care   Follow instructions from your health care provider about how to take care of your puncture site. Make sure you: ? If present, leave stitches (sutures), skin glue, or adhesive strips in place. These skin closures may need to stay in place for up to 2 weeks. If adhesive strip edges start to loosen and curl up, you may trim the loose edges. Do not remove adhesive strips completely unless your health care provider tells you to do that.  Check your puncture site every day for signs of infection. Check for: ? Redness, swelling, or pain. ? Fluid or blood. If your puncture site starts to bleed, lie down on your back, apply firm pressure to the area, and contact your health care provider. ? Warmth. ? Pus or a bad smell. Driving  Do not drive for at least 4 days after your procedure or however long your health care provider recommends. (Do not resume driving if you have previously been instructed not to drive for other health reasons.)  Do not drive or use heavy machinery while taking prescription pain medicine. Activity  Avoid  activities that take a lot of effort for at least 7 days after your procedure.  Do not lift anything that is heavier than 5 lb (4.5 kg) for one week.   No sexual activity for 1 week.   Return to your normal activities as told by your health care provider. Ask your health care provider what activities are safe for you. General instructions  Take over-the-counter and prescription medicines only as told by your health care provider.  Do not use any products that contain nicotine or tobacco, such as cigarettes and e-cigarettes. If you need help quitting, ask your health care provider.  You may shower after 24 hours, but Do not take baths, swim, or use a hot tub for 1 week.   Do not drink alcohol for 24 hours after your procedure.  Keep all follow-up visits as told by your health care provider. This is important. Contact a health care provider if:  You have redness, mild swelling, or pain around your puncture site.  You have fluid or blood coming from your puncture site that stops after applying firm pressure to the area.  Your puncture site feels warm to the touch.  You have pus or a bad smell coming from your puncture site.  You have a fever.  You have chest pain or discomfort that spreads to your neck, jaw, or arm.  You are sweating a lot.  You feel nauseous.  You have a fast or irregular heartbeat.  You have shortness of breath.  You are dizzy  or light-headed and feel the need to lie down.  You have pain or numbness in the arm or leg closest to your puncture site. Get help right away if:  Your puncture site suddenly swells.  Your puncture site is bleeding and the bleeding does not stop after applying firm pressure to the area. These symptoms may represent a serious problem that is an emergency. Do not wait to see if the symptoms will go away. Get medical help right away. Call your local emergency services (911 in the [place] Do not drive yourself to the  hospital. Summary  After the procedure, it is normal to have bruising and tenderness at the puncture site in your groin, neck, or forearm.  Check your puncture site every day for signs of infection.  Get help right away if your puncture site is bleeding and the bleeding does not stop after applying firm pressure to the area. This is a medical emergency. This information is not intended to replace advice given to you by your health care provider. Make sure you discuss any questions you have with your health care provider.

## 2019-12-22 NOTE — Anesthesia Preprocedure Evaluation (Signed)
Anesthesia Evaluation  Patient identified by MRN, date of birth, ID band Patient awake    Reviewed: Allergy & Precautions, NPO status , Patient's Chart, lab work & pertinent test results  Airway Mallampati: I  TM Distance: >3 FB Neck ROM: Full    Dental   Pulmonary sleep apnea ,    Pulmonary exam normal        Cardiovascular hypertension, Pt. on medications Normal cardiovascular exam+ dysrhythmias Atrial Fibrillation      Neuro/Psych    GI/Hepatic   Endo/Other    Renal/GU      Musculoskeletal   Abdominal   Peds  Hematology   Anesthesia Other Findings   Reproductive/Obstetrics                             Anesthesia Physical Anesthesia Plan  ASA: III  Anesthesia Plan: General   Post-op Pain Management:    Induction: Intravenous  PONV Risk Score and Plan: 2 and Treatment may vary due to age or medical condition and Ondansetron  Airway Management Planned: LMA  Additional Equipment:   Intra-op Plan:   Post-operative Plan: Extubation in OR  Informed Consent: I have reviewed the patients History and Physical, chart, labs and discussed the procedure including the risks, benefits and alternatives for the proposed anesthesia with the patient or authorized representative who has indicated his/her understanding and acceptance.       Plan Discussed with: CRNA and Surgeon  Anesthesia Plan Comments:         Anesthesia Quick Evaluation

## 2019-12-22 NOTE — Anesthesia Postprocedure Evaluation (Signed)
Anesthesia Post Note  Patient: Garrett Hanson  Procedure(s) Performed: ATRIAL FIBRILLATION ABLATION (N/A )     Patient location during evaluation: Cath Lab Anesthesia Type: General Level of consciousness: awake Pain management: pain level controlled Vital Signs Assessment: post-procedure vital signs reviewed and stable Respiratory status: spontaneous breathing, nonlabored ventilation, respiratory function stable and patient connected to nasal cannula oxygen Cardiovascular status: blood pressure returned to baseline and stable Postop Assessment: no apparent nausea or vomiting Anesthetic complications: no   No complications documented.  Last Vitals:  Vitals:   12/22/19 1456 12/22/19 1457  BP: 129/82 129/82  Pulse: 74 75  Resp: 18 18  Temp:    SpO2: 92% 94%    Last Pain:  Vitals:   12/22/19 1344  TempSrc:   PainSc: 0-No pain                 Kyle Luppino P Kadra Kohan

## 2019-12-22 NOTE — Transfer of Care (Signed)
Immediate Anesthesia Transfer of Care Note  Patient: Garrett Hanson  Procedure(s) Performed: ATRIAL FIBRILLATION ABLATION (N/A )  Patient Location: Cath Lab  Anesthesia Type:General  Level of Consciousness: awake, alert  and oriented  Airway & Oxygen Therapy: Patient Spontanous Breathing and Patient connected to nasal cannula oxygen  Post-op Assessment: Report given to RN and Post -op Vital signs reviewed and stable  Post vital signs: Reviewed and stable  Last Vitals:  Vitals Value Taken Time  BP 132/77 12/22/19 1244  Temp 36.5 C 12/22/19 1244  Pulse 73 12/22/19 1245  Resp 17 12/22/19 1245  SpO2 95 % 12/22/19 1245  Vitals shown include unvalidated device data.  Last Pain:  Vitals:   12/22/19 1244  TempSrc: Temporal  PainSc: 0-No pain         Complications: No complications documented.

## 2019-12-22 NOTE — Interval H&P Note (Signed)
History and Physical Interval Note:  12/22/2019 9:06 AM  Roseanna Rainbow Hamm  has presented today for surgery, with the diagnosis of afib.  The various methods of treatment have been discussed with the patient and family. After consideration of risks, benefits and other options for treatment, the patient has consented to  Procedure(s): ATRIAL FIBRILLATION ABLATION (N/A) as a surgical intervention.  The patient's history has been reviewed, patient examined, no change in status, stable for surgery.  I have reviewed the patient's chart and labs.  Questions were answered to the patient's satisfaction.    Risk, benefits, and alternatives to EP study and radiofrequency ablation for afib were also discussed in detail today. These risks include but are not limited to stroke, bleeding, vascular damage, tamponade, perforation, damage to the esophagus, lungs, and other structures, pulmonary vein stenosis, worsening renal function, and death. The patient understands these risk and wishes to proceed.    He reports compliance with eliquis without interruption. Cardiac CT reviewed with him at length today   Hillis Range

## 2019-12-22 NOTE — Anesthesia Procedure Notes (Signed)
Procedure Name: Intubation Performed by: Valda Favia, CRNA Pre-anesthesia Checklist: Patient identified, Emergency Drugs available, Suction available and Patient being monitored Patient Re-evaluated:Patient Re-evaluated prior to induction Oxygen Delivery Method: Circle System Utilized Preoxygenation: Pre-oxygenation with 100% oxygen Induction Type: IV induction Ventilation: Mask ventilation without difficulty and Oral airway inserted - appropriate to patient size Laryngoscope Size: Mac and 4 Grade View: Grade II Tube type: Oral Tube size: 7.5 mm Number of attempts: 1 Airway Equipment and Method: Stylet and Oral airway Placement Confirmation: ETT inserted through vocal cords under direct vision,  positive ETCO2 and breath sounds checked- equal and bilateral Secured at: 24 cm Tube secured with: Tape Dental Injury: Teeth and Oropharynx as per pre-operative assessment

## 2019-12-23 ENCOUNTER — Encounter (HOSPITAL_COMMUNITY): Payer: Self-pay | Admitting: Internal Medicine

## 2019-12-23 LAB — POCT ACTIVATED CLOTTING TIME
Activated Clotting Time: 257 seconds
Activated Clotting Time: 279 seconds
Activated Clotting Time: 329 seconds

## 2019-12-23 MED FILL — Heparin Sod (Porcine)-NaCl IV Soln 1000 Unit/500ML-0.9%: INTRAVENOUS | Qty: 1000 | Status: AC

## 2019-12-25 ENCOUNTER — Telehealth (HOSPITAL_COMMUNITY): Payer: Self-pay | Admitting: *Deleted

## 2019-12-25 ENCOUNTER — Telehealth: Payer: Self-pay

## 2019-12-25 NOTE — Telephone Encounter (Signed)
**Note De-Identified Garrett Hanson Obfuscation** I started a Dofetilide PA through covermymeds: Key: [id number]

## 2019-12-25 NOTE — Telephone Encounter (Signed)
Patient called in with constant nausea since coming home from ablation. Subsides with eating/drinking but quickly returns. Gassy and abdominal bloating as well. Had BM this morning no vomiting or fever. Throat is very sore from intubation but able to eat/drink without pain. Discussed with Dr. Johney Frame recommends holding protonix over weekend report update next week. Pt is on famotidine BID.

## 2019-12-31 NOTE — Telephone Encounter (Signed)
[facility] received the PA for Dofetilide but needed additional information. The form was completed and signed by Dr Johney Frame and has been faxed back.

## 2020-01-01 NOTE — Telephone Encounter (Signed)
Coverage approved 12/31/19-12/30/20

## 2020-01-04 ENCOUNTER — Other Ambulatory Visit (HOSPITAL_COMMUNITY): Payer: Self-pay | Admitting: Nurse Practitioner

## 2020-01-13 ENCOUNTER — Other Ambulatory Visit: Payer: Self-pay | Admitting: Internal Medicine

## 2020-01-19 ENCOUNTER — Ambulatory Visit (HOSPITAL_COMMUNITY)
Admission: RE | Admit: 2020-01-19 | Discharge: 2020-01-19 | Disposition: A | Discharge status: Home / Self Care | Payer: Managed Care, Other (non HMO) | Source: Ambulatory Visit | Attending: Nurse Practitioner | Admitting: Nurse Practitioner

## 2020-01-19 ENCOUNTER — Other Ambulatory Visit: Payer: Self-pay

## 2020-01-19 ENCOUNTER — Encounter (HOSPITAL_COMMUNITY): Payer: Self-pay | Admitting: Nurse Practitioner

## 2020-01-19 VITALS — BP 116/82 | HR 59 | Ht 74.0 in | Wt 297.2 lb

## 2020-01-19 DIAGNOSIS — Z79899 Other long term (current) drug therapy: Secondary | ICD-10-CM | POA: Diagnosis not present

## 2020-01-19 DIAGNOSIS — I4819 Other persistent atrial fibrillation: Secondary | ICD-10-CM | POA: Insufficient documentation

## 2020-01-19 DIAGNOSIS — Z8249 Family history of ischemic heart disease and other diseases of the circulatory system: Secondary | ICD-10-CM | POA: Insufficient documentation

## 2020-01-19 DIAGNOSIS — I480 Paroxysmal atrial fibrillation: Secondary | ICD-10-CM

## 2020-01-19 DIAGNOSIS — G4733 Obstructive sleep apnea (adult) (pediatric): Secondary | ICD-10-CM | POA: Insufficient documentation

## 2020-01-19 DIAGNOSIS — I4891 Unspecified atrial fibrillation: Secondary | ICD-10-CM | POA: Diagnosis not present

## 2020-01-19 DIAGNOSIS — D6869 Other thrombophilia: Secondary | ICD-10-CM | POA: Diagnosis not present

## 2020-01-19 DIAGNOSIS — Z7901 Long term (current) use of anticoagulants: Secondary | ICD-10-CM | POA: Diagnosis not present

## 2020-01-19 DIAGNOSIS — I129 Hypertensive chronic kidney disease with stage 1 through stage 4 chronic kidney disease, or unspecified chronic kidney disease: Secondary | ICD-10-CM | POA: Diagnosis not present

## 2020-01-19 DIAGNOSIS — N182 Chronic kidney disease, stage 2 (mild): Secondary | ICD-10-CM | POA: Insufficient documentation

## 2020-01-19 NOTE — Progress Notes (Signed)
Primary Care Physician: Lawerance Cruel, MD Referring Physician: [place] Cardiologist: Dr. Sabra Heck is a 58 y.o. male with a h/o HTN, obesity, recently diagnosed sleep apnea, started CPAP 4 weeks ago. He was found to be in rate controlled afib at time of physical mid summer and was sent to Cardiology. He was set up for cardioversion, 9/24,  and unfortunately would not shock out. He came into  the afib clinic for other options.  He reports that 20 years ago he had a couple of episodes of rapid heart rate and possibly Dr. Caryl Comes, can't for sure remember the name, did cardiac mapping, but no ablation was done and since this was a very infrequent thing, no other treatment was indicated at the time. Now, pt thinks he could have been in afib for a year, as it was not present on his last physical exam. He may have had some intermittent episodes before that. He went to [place] in May and was more short of breath when they were out exploring the area. Echo showed severe enlargement of left and right atrium, 47 mm with volume of 93.  He  was more symptomatic with fatigue, dyspnea, hard to do his usual activities.  F/u in afib clinic 10/26, f/u tikosyn start, he is staying in [place], feels some better, but he has just been in SR x one week and has just been using his CPAP for a few weeks, so he anticipates that he will be feeling better with time. He is very serious that he wants to lose weight and become more active.  F/u in afib clinic 12/7, one month after tikosyn start. He is staying in [place]. Bp has improved on spironolactone and K+ levels improved. He feels well. Being complaint with CPAP and Tikosyn, apixaban.  F/u with afib clinic, 8/8. He went back into afib yesterday. He does not  know of any trigger. Continues on Tikosyn and eliquis, no missed doses. He has noted some low BP readings at home. Feels sluggish in afib.  He is back in afib clinic, 10/15. After visit on 8/8, pt  did spontaneously revert to SR and did not require cardioversion. However, he has been out of rhythm in rate controlled afib  X 2 weeks and will pursue cardioversion. He does not know any definite trigger.  F/u in clinic after cardioversion 11/4 after successful cardioversion.He has not noted any further afib.   F/u in afib clinic, 05/30/18. He reports that he is doing well staying in rhythm, compliant with Tikosyn.   F/u in afib clinic 01/26/19 for return of afib last week. He feels the stressor for afib is that his brother had sudden death in the last few weeks presumed to be from an MI.His mother was then in the hospital for chest pain but no MI. He continues on Tikosyn. He would prefer a cardioversion instead of proceeding to ablation. No missed anticoagulation.   F/u in afib clinic 10/19. He had successful DCCV and is reaming in SR. He still is  pending  an appointment with Dr. Loletha Grayer or his team for further evaluation of possible CAD with brother's recent sudden death thought 2/2 CAD.   F/u in afib clinic, 08/10/19. He remains in SR. No afib to report. He has had his covid shots. Taking dofetilide and eliquis without issues. CHA2DS2VASc score of  F/u in afib clinic, 4/ 29/21, he unfortunately went back into afib that pm after being seen  here and was in SR. His BB was increased, which has resulted with  him going back  into rhythm in the past, however this time, he remains in rate controlled afib. No missed anticoagulation. He has had both covid shots.  F/u in afib clinic, 01/20/20. He is in one month s/p ablation. He has not noted any afib since the procedure. He continues on dofetilide. He did have nausea for the first few days after the procedure. PPI was stopped ant the nausea resolved. No swallowing or groin issues.   Today, he denies symptoms of palpitations, chest pain,   orthopnea, PND, lower extremity edema, dizziness, presyncope, syncope, or neurologic sequela.  The patient is tolerating  medications without difficulties and is otherwise without complaint today.   Past Medical History:  Diagnosis Date  . Arthritis    "left knee; probably in the shoulders; left hip" (02/05/2017)  . Chronic bronchitis (HCC)    "get it ~ q other year" (02/05/2017)  . Chronic urticaria   . CKD (chronic kidney disease), stage II    pt denies this hx on 02/05/2017  . Complication of anesthesia    pt states awaken during colonscopy   . Dysrhythmia    a fib  . Essential hypertension   . History of blood transfusion 1961  . History of common duodenal ulcer 1980  . History of kidney stones   . Lymphadenopathy    a. evaluated by heme-onc 04/2016, under surveillance.  . OSA on CPAP dx'd ~ 09/2016  . Persistent atrial fibrillation (HCC)   . Pneumonia    "X2" (02/05/2017)  . Pure hypercholesterolemia    Past Surgical History:  Procedure Laterality Date  . ANTERIOR CERVICAL DECOMP/DISCECTOMY FUSION  04/2005   "used piece from my hip"  . ATRIAL FIBRILLATION ABLATION N/A 12/22/2019   Procedure: ATRIAL FIBRILLATION ABLATION;  Surgeon: Hillis Range, MD;  Location: [facility] INVASIVE CV LAB;  Service: Cardiovascular;  Laterality: N/A;  . CARDIOVERSION N/A 01/14/2017   Procedure: CARDIOVERSION;  Surgeon: Jake Bathe, MD;  Location: [facility] ENDOSCOPY;  Service: Cardiovascular;  Laterality: N/A;  . CARDIOVERSION N/A 02/07/2017   Procedure: CARDIOVERSION;  Surgeon: Chilton Si, MD;  Location: [facility] ENDOSCOPY;  Service: Cardiovascular;  Laterality: N/A;  . CARDIOVERSION N/A 02/10/2018   Procedure: CARDIOVERSION;  Surgeon: Thurmon Fair, MD;  Location: [facility] ENDOSCOPY;  Service: Cardiovascular;  Laterality: N/A;  . CARDIOVERSION N/A 02/02/2019   Procedure: CARDIOVERSION;  Surgeon: Lars Masson, MD;  Location: [facility] ENDOSCOPY;  Service: Cardiovascular;  Laterality: N/A;  . CARDIOVERSION N/A 08/26/2019   Procedure: CARDIOVERSION;  Surgeon: Elease Hashimoto Deloris Ping, MD;  Location: [facility] ENDOSCOPY;  Service: Cardiovascular;   Laterality: N/A;  . COLONOSCOPY W/ BIOPSIES AND POLYPECTOMY  ~ 2012   "benign"  . ELBOW SURGERY Right    "Tommy John surgery"  . EXTRACORPOREAL SHOCK WAVE LITHOTRIPSY  1990s  . INGUINAL HERNIA REPAIR Right 1980  . JOINT REPLACEMENT     left knee  . KNEE ARTHROSCOPY Right ~ 1990  . SHOULDER ARTHROSCOPY W/ ROTATOR CUFF REPAIR Right 2000s X 2  . TONSILLECTOMY     "I think I did"  . TOTAL HIP ARTHROPLASTY Left 06/04/2017   Procedure: LEFT TOTAL HIP ARTHROPLASTY ANTERIOR APPROACH;  Surgeon: Durene Romans, MD;  Location: [facility] ORS;  Service: Orthopedics;  Laterality: Left;  . TOTAL KNEE ARTHROPLASTY Left 02/07/2015   Procedure: LEFT TOTAL KNEE ARTHROPLASTY;  Surgeon: Durene Romans, MD;  Location: [facility] ORS;  Service: Orthopedics;  Laterality: Left;    Current  Outpatient Medications  Medication Sig Dispense Refill  . albuterol (PROVENTIL HFA;VENTOLIN HFA) 108 (90 Base) MCG/ACT inhaler Inhale 1-2 puffs into the lungs as needed for wheezing or shortness of breath.     Marland Kitchen apixaban (ELIQUIS) 5 MG TABS tablet Take 1 tablet (5 mg total) by mouth 2 (two) times daily. 180 tablet 2  . atorvastatin (LIPITOR) 40 MG tablet Take 40 mg by mouth every evening.    . betamethasone dipropionate 0.05 % cream Apply topically.    . clindamycin (CLEOCIN) 300 MG capsule Take 900 mg by mouth See admin instructions. Take 3 capsules (900 mg) by mouth 1 hour prior to dental work    . dofetilide (TIKOSYN) 500 MCG capsule TAKE 1 CAPSULE BY MOUTH TWICE A DAY 60 capsule 6  . DUEXIS 800-26.6 MG TABS Take 1 tablet by mouth daily as needed (pain.).     Marland Kitchen EPIPEN 2-PAK 0.3 MG/0.3ML SOAJ injection Inject 0.3 mg into the muscle as needed for anaphylaxis.   3  . famotidine (PEPCID) 40 MG tablet Take 40 mg by mouth 2 (two) times daily.    . fluticasone (FLONASE) 50 MCG/ACT nasal spray Place 1 spray into both nostrils daily as needed for allergies.    Marland Kitchen KLOR-CON M20 20 MEQ tablet TAKE 1 TABLET TWICE A DAY 180 tablet 3  . losartan (COZAAR)  100 MG tablet Take 100 mg by mouth daily.     . metoprolol tartrate (LOPRESSOR) 25 MG tablet Take 25-50 mg by mouth See admin instructions. Take 25 mg in the morning and 50 mg at night    . nystatin (MYCOSTATIN/NYSTOP) powder Apply 1 application topically 2 (two) times daily as needed (skin irritation.).     Marland Kitchen pantoprazole (PROTONIX) 40 MG tablet TAKE 1 TABLET BY MOUTH EVERY DAY 30 tablet 0  . Probiotic CAPS Take 1 capsule by mouth daily.    Marland Kitchen spironolactone (ALDACTONE) 25 MG tablet Take 1 tablet (25 mg total) by mouth daily. 90 tablet 3  . tadalafil (ADCIRCA/CIALIS) 20 MG tablet Take 20 mg by mouth daily as needed for erectile dysfunction.      No current facility-administered medications for this encounter.    Allergies  Allergen Reactions  . Contrast Media [Iodinated Diagnostic Agents] Shortness Of Breath and Palpitations    Broke out in sweats, tolerates if premedicated   . Penicillins Anaphylaxis    Has patient had a PCN reaction causing immediate rash, facial/tongue/throat swelling, SOB or lightheadedness with hypotension: Yes Has patient had a PCN reaction causing severe rash involving mucus membranes or skin necrosis: No Has patient had a PCN reaction that required hospitalization:Yes Has patient had a PCN reaction occurring within the last 10 years: No If all of the above answers are "NO", then may proceed with Cephalosporin use.   Marland Kitchen Singulair [Montelukast Sodium] Swelling    Lips, mouth, and hand swelling     Social History   Socioeconomic History  . Marital status: Married    Spouse name: Not on file  . Number of children: Not on file  . Years of education: Not on file  . Highest education level: Not on file  Occupational History  . Not on file  Tobacco Use  . Smoking status: Never Smoker  . Smokeless tobacco: Never Used  Vaping Use  . Vaping Use: Never used  Substance and Sexual Activity  . Alcohol use: Yes    Comment: 02/05/2017 "2-4 glasses of wine/month"  .  Drug use: No  . Sexual activity:  Yes  Other Topics Concern  . Not on file  Social History Narrative   [place]  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   Social Determinants of Health   Financial Resource Strain:   . Difficulty of Paying Living Expenses: Not on file  Food Insecurity:   . Worried About Programme researcher, broadcasting/film/videounning Out of Food in the Last Year: Not on file  . Ran Out of Food in the Last Year: Not on file  Transportation Needs:   . Lack of Transportation (Medical): Not on file  . Lack of Transportation (Non-Medical): Not on file  Physical Activity:   . Days of Exercise per Week: Not on file  . Minutes of Exercise per Session: Not on file  Stress:   . Feeling of Stress : Not on file  Social Connections:   . Frequency of Communication with Friends and Family: Not on file  . Frequency of Social Gatherings with Friends and Family: Not on file  . Attends Religious Services: Not on file  . Active Member of Clubs or Organizations: Not on file  . Attends BankerClub or Organization Meetings: Not on file  . Marital Status: Not on file  Intimate Partner Violence:   . Fear of Current or Ex-Partner: Not on file  . Emotionally Abused: Not on file  . Physically Abused: Not on file  . Sexually Abused: Not on file    Family History  Problem Relation Age of Onset  . Hypertension Mother   . Hyperlipidemia Mother   . Breast cancer Mother   . Uterine cancer Mother   . Sleep apnea Mother   . Hypertension Brother   . Hyperlipidemia Brother   . Cancer Brother     ROS- All systems are reviewed and negative except as per the HPI above  Physical Exam: Vitals:   01/19/20 1547  BP: 116/82  Pulse: (!) 59  Weight: 134.8 kg  Height: 6\' 2"  (1.88 m)   Wt Readings from Last 3 Encounters:  01/19/20 134.8 kg  12/22/19 133.8 kg  12/01/19 135.2 kg    Labs: Lab Results  Component Value Date   NA 136 11/24/2019   K 4.9 11/24/2019   CL 103 11/24/2019   CO2 24  11/24/2019   GLUCOSE 95 11/24/2019   BUN 21 (H) 11/24/2019   CREATININE 1.20 11/24/2019   CALCIUM 9.2 11/24/2019   MG 2.1 11/24/2019   Lab Results  Component Value Date   INR 1.1 12/31/2016   No results found for: CHOL, HDL, LDLCALC, TRIG   GEN- The patient is well appearing, alert and oriented x 3 today.   Head- normocephalic, atraumatic Eyes-  Sclera clear, conjunctiva pink Ears- hearing intact Oropharynx- clear Neck- supple, no JVP Lymph- no cervical lymphadenopathy Lungs- Clear to ausculation bilaterally, normal work of breathing Heart-  regular rate and rhythm, no murmurs, rubs or gallops, PMI not laterally displaced GI- soft, NT, ND, + BS Extremities- no clubbing, cyanosis, or edema MS- no significant deformity or atrophy Skin- no rash or lesion Psych- euthymic mood, full affect Neuro- strength and sensation are intact  EKG- Sinus brady at 59 bpm, pr int 158 ms, qrs int 108 ms, qtc 459 (stable)  Epic records reviewed Echo-Study Conclusions  - Left ventricle: The cavity size was normal. There was mild   concentric hypertrophy. Systolic function was normal. The   estimated ejection fraction was in the range of 60% to 65%. Wall   motion was  normal; there were no regional wall motion   abnormalities. - Aortic valve: Transvalvular velocity was within the normal range.   There was no stenosis. There was no regurgitation. - Aorta: Ascending aortic diameter: 38 mm (S). - Ascending aorta: The ascending aorta was mildly dilated. - Mitral valve: Transvalvular velocity was within the normal range.   There was no evidence for stenosis. There was trivial   regurgitation. - Left atrium: The atrium was severely dilated. - Right ventricle: The cavity size was normal. Wall thickness was   normal. Systolic function was normal. - Right atrium: The atrium was severely dilated. - Tricuspid valve: There was trivial regurgitation.   Assessment and Plan: 1. Persistent symptomatic  afib   S/p ablation one month ago  Maintaining  SR  Continue dofetilide 500 mg bid  Continue CPAP Continue eliquis 5 mg bid  for chadsvasc score of 1       2. HTN Stable   F/u with Dr. Johney Frame 11/29   Elvina Sidle. [facility] [address] [place], [place] [address] [phone]

## 2020-02-12 ENCOUNTER — Other Ambulatory Visit: Payer: Self-pay | Admitting: Internal Medicine

## 2020-02-16 ENCOUNTER — Other Ambulatory Visit: Payer: Self-pay | Admitting: *Deleted

## 2020-02-16 DIAGNOSIS — I83893 Varicose veins of bilateral lower extremities with other complications: Secondary | ICD-10-CM

## 2020-02-22 ENCOUNTER — Other Ambulatory Visit (HOSPITAL_COMMUNITY): Payer: Self-pay | Admitting: Nurse Practitioner

## 2020-02-24 ENCOUNTER — Ambulatory Visit (INDEPENDENT_AMBULATORY_CARE_PROVIDER_SITE_OTHER): Payer: Managed Care, Other (non HMO) | Admitting: Physician Assistant

## 2020-02-24 ENCOUNTER — Encounter: Payer: Self-pay | Admitting: Physician Assistant

## 2020-02-24 ENCOUNTER — Other Ambulatory Visit: Payer: Self-pay

## 2020-02-24 ENCOUNTER — Ambulatory Visit (HOSPITAL_COMMUNITY)
Admission: RE | Admit: 2020-02-24 | Discharge: 2020-02-24 | Disposition: A | Discharge status: Home / Self Care | Payer: Managed Care, Other (non HMO) | Source: Ambulatory Visit | Attending: Physician Assistant | Admitting: Physician Assistant

## 2020-02-24 VITALS — BP 127/86 | HR 68 | Temp 98.7°F | Resp 20 | Ht 74.0 in | Wt 298.4 lb

## 2020-02-24 DIAGNOSIS — I83893 Varicose veins of bilateral lower extremities with other complications: Secondary | ICD-10-CM | POA: Diagnosis present

## 2020-02-24 DIAGNOSIS — I872 Venous insufficiency (chronic) (peripheral): Secondary | ICD-10-CM | POA: Diagnosis not present

## 2020-02-24 NOTE — Progress Notes (Signed)
[facility] [place]   Reason for referral: Swollen bilateral legs with multiple reticular, telangiectasias veins in the lower legs and surrounding both ankles.  History of Present Illness  Garrett Hanson is a 59 y.o. male who presents with chief complaint: swollen leg.  Patient notes, onset of swelling for years, associated with prolonged sitting and standing.  The patient has had no history of DVT, positive history of varicose vein, no history of venous stasis ulcers, no history of  Lymphedema and positive history of skin changes in lower legs.  There is a family history of venous disorders.  The patient has  Used 20-30 mm hg knee high compression stockings in the past.  Past Medical History:  Diagnosis Date  . Arthritis    "left knee; probably in the shoulders; left hip" (02/05/2017)  . Chronic bronchitis (HCC)    "get it ~ q other year" (02/05/2017)  . Chronic urticaria   . CKD (chronic kidney disease), stage II    pt denies this hx on 02/05/2017  . Complication of anesthesia    pt states awaken during colonscopy   . Dysrhythmia    a fib  . Essential hypertension   . History of blood transfusion 1961  . History of common duodenal ulcer 1980  . History of kidney stones   . Lymphadenopathy    a. evaluated by heme-onc 04/2016, under surveillance.  . OSA on CPAP dx'd ~ 09/2016  . Persistent atrial fibrillation (HCC)   . Pneumonia    "X2" (02/05/2017)  . Pure hypercholesterolemia     Past Surgical History:  Procedure Laterality Date  . ANTERIOR CERVICAL DECOMP/DISCECTOMY FUSION  04/2005   "used piece from my hip"  . ATRIAL FIBRILLATION ABLATION N/A 12/22/2019   Procedure: ATRIAL FIBRILLATION ABLATION;  Surgeon: Hillis RangeAllred, James, MD;  [facility];  Service: Cardiovascular;  Laterality: N/A;  . CARDIOVERSION N/A 01/14/2017   Procedure: CARDIOVERSION;  Surgeon: Jake BatheSkains, Meshulem C, MD;  [facility];  Service: Cardiovascular;  Laterality:  N/A;  . CARDIOVERSION N/A 02/07/2017   Procedure: CARDIOVERSION;  Surgeon: Chilton Siandolph, Tiffany, MD;  [facility];  Service: Cardiovascular;  Laterality: N/A;  . CARDIOVERSION N/A 02/10/2018   Procedure: CARDIOVERSION;  Surgeon: Thurmon Fairroitoru, Mihai, MD;  Location: [facility];  Service: Cardiovascular;  Laterality: N/A;  . CARDIOVERSION N/A 02/02/2019   Procedure: CARDIOVERSION;  Surgeon: Lars MassonNelson, Katarina H, MD;  [facility];  Service: Cardiovascular;  Laterality: N/A;  . CARDIOVERSION N/A 08/26/2019   Procedure: CARDIOVERSION;  Surgeon: Elease HashimotoNahser, Deloris PingPhilip J, MD;  [facility];  Service: Cardiovascular;  Laterality: N/A;  . COLONOSCOPY W/ BIOPSIES AND POLYPECTOMY  ~ 2012   "benign"  . ELBOW SURGERY Right    "Tommy John surgery"  . EXTRACORPOREAL SHOCK WAVE LITHOTRIPSY  1990s  . INGUINAL HERNIA REPAIR Right 1980  . JOINT REPLACEMENT     left knee  . KNEE ARTHROSCOPY Right ~ 1990  . SHOULDER ARTHROSCOPY W/ ROTATOR CUFF REPAIR Right 2000s X 2  . TONSILLECTOMY     "I think I did"  . TOTAL HIP ARTHROPLASTY Left 06/04/2017   Procedure: LEFT TOTAL HIP ARTHROPLASTY ANTERIOR APPROACH;  Surgeon: Durene Romanslin, Matthew, MD;  Location: [facility];  Service: Orthopedics;  Laterality: Left;  . TOTAL KNEE ARTHROPLASTY Left 02/07/2015   Procedure: LEFT TOTAL KNEE ARTHROPLASTY;  Surgeon: Durene RomansMatthew Olin, MD;  Location: [facility];  Service: Orthopedics;  Laterality: Left;    Social History   Socioeconomic History  . Marital  status: Married    Spouse name: Not on file  . Number of children: Not on file  . Years of education: Not on file  . Highest education level: Not on file  Occupational History  . Not on file  Tobacco Use  . Smoking status: Never Smoker  . Smokeless tobacco: Never Used  Vaping Use  . Vaping Use: Never used  Substance and Sexual Activity  . Alcohol use: Yes    Comment: 02/05/2017 "2-4 glasses of wine/month"  . Drug use: No  . Sexual activity: Yes  Other Topics Concern   . Not on file  Social History Narrative   Pt lives in [place].  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   Social Determinants of Health   Financial Resource Strain:   . Difficulty of Paying Living Expenses: Not on file  Food Insecurity:   . Worried About Programme researcher, broadcasting/film/video in the Last Year: Not on file  . Ran Out of Food in the Last Year: Not on file  Transportation Needs:   . Lack of Transportation (Medical): Not on file  . Lack of Transportation (Non-Medical): Not on file  Physical Activity:   . Days of Exercise per Week: Not on file  . Minutes of Exercise per Session: Not on file  Stress:   . Feeling of Stress : Not on file  Social Connections:   . Frequency of Communication with Friends and Family: Not on file  . Frequency of Social Gatherings with Friends and Family: Not on file  . Attends Religious Services: Not on file  . Active Member of Clubs or Organizations: Not on file  . Attends Banker Meetings: Not on file  . Marital Status: Not on file  Intimate Partner Violence:   . Fear of Current or Ex-Partner: Not on file  . Emotionally Abused: Not on file  . Physically Abused: Not on file  . Sexually Abused: Not on file    Family History  Problem Relation Age of Onset  . Hypertension Mother   . Hyperlipidemia Mother   . Breast cancer Mother   . Uterine cancer Mother   . Sleep apnea Mother   . Hypertension Brother   . Hyperlipidemia Brother   . Cancer Brother     Current Outpatient Medications on File Prior to Visit  Medication Sig Dispense Refill  . albuterol (PROVENTIL HFA;VENTOLIN HFA) 108 (90 Base) MCG/ACT inhaler Inhale 1-2 puffs into the lungs as needed for wheezing or shortness of breath.     Marland Kitchen apixaban (ELIQUIS) 5 MG TABS tablet Take 1 tablet (5 mg total) by mouth 2 (two) times daily. 180 tablet 2  . atorvastatin (LIPITOR) 40 MG tablet Take 40 mg by mouth every evening.    . betamethasone dipropionate 0.05 % cream  Apply topically.    . clindamycin (CLEOCIN) 300 MG capsule Take 900 mg by mouth See admin instructions. Take 3 capsules (900 mg) by mouth 1 hour prior to dental work    . dofetilide (TIKOSYN) 500 MCG capsule TAKE 1 CAPSULE BY MOUTH TWICE A DAY 60 capsule 6  . DUEXIS 800-26.6 MG TABS Take 1 tablet by mouth daily as needed (pain.).     Marland Kitchen EPIPEN 2-PAK 0.3 MG/0.3ML SOAJ injection Inject 0.3 mg into the muscle as needed for anaphylaxis.   3  . famotidine (PEPCID) 40 MG tablet Take 40 mg by mouth 2 (two) times daily.    . fluticasone (FLONASE) 50  MCG/ACT nasal spray Place 1 spray into both nostrils daily as needed for allergies.    Marland Kitchen KLOR-CON M20 20 MEQ tablet TAKE 1 TABLET TWICE A DAY 180 tablet 1  . losartan (COZAAR) 100 MG tablet Take 100 mg by mouth daily.     . metoprolol tartrate (LOPRESSOR) 25 MG tablet Take 25-50 mg by mouth See admin instructions. Take 25 mg in the morning and 50 mg at night    . nystatin (MYCOSTATIN/NYSTOP) powder Apply 1 application topically 2 (two) times daily as needed (skin irritation.).     Marland Kitchen pantoprazole (PROTONIX) 40 MG tablet TAKE 1 TABLET BY MOUTH EVERY DAY 30 tablet 0  . Probiotic CAPS Take 1 capsule by mouth daily.    Marland Kitchen spironolactone (ALDACTONE) 25 MG tablet Take 1 tablet (25 mg total) by mouth daily. 90 tablet 3  . tadalafil (ADCIRCA/CIALIS) 20 MG tablet Take 20 mg by mouth daily as needed for erectile dysfunction.      No current facility-administered medications on file prior to visit.    Allergies as of 02/24/2020 - Review Complete 01/19/2020  Allergen Reaction Noted  . Contrast media [iodinated diagnostic agents] Shortness Of Breath and Palpitations 01/21/2015  . Penicillins Anaphylaxis 01/21/2015  . Singulair [montelukast sodium] Swelling 01/21/2015     ROS:   General:  No weight loss, Fever, chills  HEENT: No recent headaches, no nasal bleeding, no visual changes, no sore throat  Neurologic: No dizziness, blackouts, seizures. No recent  symptoms of stroke or mini- stroke. No recent episodes of slurred speech, or temporary blindness.  Cardiac: No recent episodes of chest pain/pressure, no shortness of breath at rest.  No shortness of breath with exertion.  positve history of atrial fibrillation or irregular heartbeat  Vascular: No history of rest pain in feet.  No history of claudication.  No history of non-healing ulcer, No history of DVT   Pulmonary: No home oxygen, no productive cough, no hemoptysis,  No asthma or wheezing  Musculoskeletal:   Arthritis,  Low back pain,   Joint pain  Hematologic:No history of hypercoagulable state.  No history of easy bleeding.  No history of anemia  Gastrointestinal: No hematochezia or melena,  No gastroesophageal reflux, no trouble swallowing  Urinary:  chronic Kidney disease,  on HD -  MWF or  TTHS,  Burning with urination,  Frequent urination,  Difficulty urinating;   Skin: No rashes, anterior legs  excoriations.  Psychological: No history of anxiety,  No history of depression  Physical Examination  Vitals:   02/24/20 1250  BP: 127/86  Pulse: 68  Resp: 20  Temp: 98.7 F (37.1 C)  TempSrc: Temporal  SpO2: 96%  Weight: 298 lb 6.4 oz (135.4 kg)  Height:  (1.88 m)    Body mass index is 38.31 kg/m.  General:  Alert and oriented, no acute distress HEENT: Normal Neck: No bruit or JVD Pulmonary: Clear to auscultation bilaterally Cardiac: Regular Rate and Rhythm without murmur Abdomen: Soft, non-tender, non-distended, no mass, no scars Skin: No rash Extremity Pulses:  2+ radial, brachial, femoral, dorsalis pedis, posterior tibial pulses bilaterally Musculoskeletal: No deformity or edema  Neurologic: Upper and lower extremity motor 5/5 and symmetric  DATA: Venous Reflux Times  +--------------+---------+------+-----------+------------+--------+  RIGHT     Reflux NoRefluxReflux TimeDiameter cmsComments                Yes                   +--------------+---------+------+-----------+------------+--------+  CFV            yes  >1 second             +--------------+---------+------+-----------+------------+--------+  FV prox    no                         +--------------+---------+------+-----------+------------+--------+  FV mid    no                         +--------------+---------+------+-----------+------------+--------+  FV dist    no                         +--------------+---------+------+-----------+------------+--------+  GSV at SFJ        yes  >500 ms   0.767        +--------------+---------+------+-----------+------------+--------+  GSV prox thigh      yes  >500 ms   0.702        +--------------+---------+------+-----------+------------+--------+  GSV mid thigh       yes  >500 ms   0.675        +--------------+---------+------+-----------+------------+--------+  GSV dist thighno               0.615        +--------------+---------+------+-----------+------------+--------+  GSV at knee  no               0.395        +--------------+---------+------+-----------+------------+--------+  GSV prox calf                 0.538        +--------------+---------+------+-----------+------------+--------+  GSV mid calf                 0.532        +--------------+---------+------+-----------+------------+--------+  SSV Pop Fossa                 0.388        +--------------+---------+------+-----------+------------+--------+  SSV prox calf no               0.357        +--------------+---------+------+-----------+------------+--------+  SSV mid  calf       yes  >500 ms   0.341        +--------------+---------+------+-----------+------------+--------+     +--------------+---------+------+-----------+------------+--------+  LEFT     Reflux NoRefluxReflux TimeDiameter cmsComments               Yes                   +--------------+---------+------+-----------+------------+--------+  CFV      no                         +--------------+---------+------+-----------+------------+--------+  FV prox    no                         +--------------+---------+------+-----------+------------+--------+  FV mid    no                         +--------------+---------+------+-----------+------------+--------+  FV dist    no                         +--------------+---------+------+-----------+------------+--------+  Popliteal   no                         +--------------+---------+------+-----------+------------+--------+  GSV at [facility]        yes  >500 ms   0.730        +--------------+---------+------+-----------+------------+--------+  GSV prox thigh      yes  >500 ms   [id number]        +--------------+---------+------+-----------+------------+--------+  GSV mid thigh       yes  >500 ms   0.887        +--------------+---------+------+-----------+------------+--------+  GSV dist thigh      yes  >500 ms   0.713        +--------------+---------+------+-----------+------------+--------+  GSV at knee        yes  >500 ms   0.455        +--------------+---------+------+-----------+------------+--------+  GSV prox calf                 0.552         +--------------+---------+------+-----------+------------+--------+  GSV mid calf                 0.520        +--------------+---------+------+-----------+------------+--------+  SSV Pop Fossa no               0.613        +--------------+---------+------+-----------+------------+--------+  SSV prox calf no               0.566        +--------------+---------+------+-----------+------------+--------+  SSV mid calf       yes  >500 ms   0.436        +--------------+---------+------+-----------+------------+--------+        Summary:  Bilateral:  - No evidence of deep vein thrombosis seen in the lower extremities,  bilaterally, from the common femoral through the popliteal veins.  - No evidence of superficial venous thrombosis in the lower extremities,  bilaterally.    Right:  - Venous reflux is noted in the right common femoral vein.  - Venous reflux is noted in the right sapheno-femoral junction.  - Venous reflux is noted in the right greater saphenous vein in the thigh.  - Venous reflux is noted in the right short saphenous vein.  - There is a large superficial branch of the GSV at the distal thigh that  connect to varicosities.    Left:  - Venous reflux is noted in the left sapheno-femoral junction.  - Venous reflux is noted in the left greater saphenous vein in the thigh.  - Venous reflux is noted in the left short saphenous vein.  - There is a large superficial branch of the GSV at the mid thigh that  connect to varicosities.    Assessment: B LE venous reflux from the SFJ through the GSV with vein size > 0.4 cms in diameter. He denise history of problem such as non healing ulcers, but he does have edema daily and telangiectasias with varicose veins in B LE   Plan:  We are measuring and placing him thigh high compress.  He will continue daily walking exercise and  elevation of his legs when at rest.  He will f/u in 3 months for consideration of laser ablation therapy in our vein clinic.    Mosetta Pigeon PA-C [facility] [place] Office: [phone]  MD in clinic [place]

## 2020-03-21 ENCOUNTER — Ambulatory Visit (INDEPENDENT_AMBULATORY_CARE_PROVIDER_SITE_OTHER): Payer: Managed Care, Other (non HMO) | Admitting: Internal Medicine

## 2020-03-21 ENCOUNTER — Encounter: Payer: Self-pay | Admitting: Internal Medicine

## 2020-03-21 ENCOUNTER — Other Ambulatory Visit: Payer: Self-pay

## 2020-03-21 VITALS — BP 128/80 | HR 58 | Ht 74.0 in | Wt 302.0 lb

## 2020-03-21 DIAGNOSIS — I4891 Unspecified atrial fibrillation: Secondary | ICD-10-CM | POA: Diagnosis not present

## 2020-03-21 DIAGNOSIS — I10 Essential (primary) hypertension: Secondary | ICD-10-CM | POA: Diagnosis not present

## 2020-03-21 DIAGNOSIS — G4733 Obstructive sleep apnea (adult) (pediatric): Secondary | ICD-10-CM | POA: Diagnosis not present

## 2020-03-21 DIAGNOSIS — I480 Paroxysmal atrial fibrillation: Secondary | ICD-10-CM

## 2020-03-21 MED ORDER — METOPROLOL TARTRATE 25 MG PO TABS: 25.0000 mg | ORAL_TABLET | Freq: Two times a day (BID) | ORAL | 3 refills | Status: DC

## 2020-03-21 NOTE — Progress Notes (Signed)
PCP: Daisy Floro, MD    Garrett Hanson is a 59 y.o. male who presents today for routine electrophysiology followup.  Since his recent afib ablation, the patient reports doing very well.  he denies procedure related complications and is pleased with the results of the procedure.  Today, he denies symptoms of palpitations, chest pain, shortness of breath,  lower extremity edema, dizziness, presyncope, or syncope.  The patient is otherwise without complaint today.   Past Medical History:  Diagnosis Date  . Arthritis    "left knee; probably in the shoulders; left hip" (02/05/2017)  . Chronic bronchitis (HCC)    "get it ~ q other year" (02/05/2017)  . Chronic urticaria   . CKD (chronic kidney disease), stage II    pt denies this hx on 02/05/2017  . Complication of anesthesia    pt states awaken during colonscopy   . Dysrhythmia    a fib  . Essential hypertension   . History of blood transfusion 1961  . History of common duodenal ulcer 1980  . History of kidney stones   . Lymphadenopathy    a. evaluated by heme-onc 04/2016, under surveillance.  . OSA on CPAP dx'd ~ 09/2016  . Persistent atrial fibrillation (HCC)   . Pneumonia    "X2" (02/05/2017)  . Pure hypercholesterolemia    Past Surgical History:  Procedure Laterality Date  . ANTERIOR CERVICAL DECOMP/DISCECTOMY FUSION  04/2005   "used piece from my hip"  . ATRIAL FIBRILLATION ABLATION N/A 12/22/2019   Procedure: ATRIAL FIBRILLATION ABLATION;  Surgeon: Hillis Range, MD;  [facility];  Service: Cardiovascular;  Laterality: N/A;  . CARDIOVERSION N/A 01/14/2017   Procedure: CARDIOVERSION;  Surgeon: Jake Bathe, MD;  Location: [facility]COPY;  Service: Cardiovascular;  Laterality: N/A;  . CARDIOVERSION N/A 02/07/2017   Procedure: CARDIOVERSION;  Surgeon: Chilton Si, MD;  Location[facility]COPY;  Service: Cardiovascular;  Laterality: N/A;  . CARDIOVERSION N/A 02/10/2018   Procedure: CARDIOVERSION;   Surgeon: Thurmon Fair, MD;  Location: [facility] ENDOS[facility];  Service: Cardiovascular;  Laterality: N/A;  . CARDIOVERSION N/A 02/02/2019   Procedure: CARDIOVERSION;  Surgeon: Lars Masson, MD;  Location: [facility]COPY;  Service: Cardiovascular;  Laterality: N/A;  . CARDIOVERSION N/A 08/26/2019   Procedure: CARDIOVERSION;  Surgeon: Elease Hashimoto Deloris Ping, MD;  Location: [facility] ENDOSCOPY;  Service: Cardiovascular;  Laterality: N/A;  . COLONOSCOPY W/ BIOPSIES AND POLYPECTOMY  ~ 2012   "benign"  . ELBOW SURGERY Right    "Tommy John surgery"  . EXTRACORPOREAL SHOCK WAVE LITHOTRIPSY  1990s  . INGUINAL HERNIA REPAIR Right 1980  . JOINT REPLACEMENT     left knee  . KNEE ARTHROSCOPY Right ~ 1990  . SHOULDER ARTHROSCOPY W/ ROTATOR CUFF REPAIR Right 2000s X 2  . TONSILLECTOMY     "I think I did"  . TOTAL HIP ARTHROPLASTY Left 06/04/2017   Procedure: LEFT TOTAL HIP ARTHROPLASTY ANTERIOR APPROACH;  Surgeon: Durene Romans, MD;  Location: [facility];  Service: Orthopedics;  Laterality: Left;  . TOTAL KNEE ARTHROPLASTY Left 02/07/2015   Procedure: LEFT TOTAL KNEE ARTHROPLASTY;  Surgeon: Durene Romans, MD;  Location: [facility];  Service: Orthopedics;  Laterality: Left;    ROS- all systems are personally reviewed and negatives except as per HPI above  Current Outpatient Medications  Medication Sig Dispense Refill  . albuterol (PROVENTIL HFA;VENTOLIN HFA) 108 (90 Base) MCG/ACT inhaler Inhale 1-2 puffs into the lungs as needed for wheezing or shortness of breath.     Marland Kitchen  apixaban (ELIQUIS) 5 MG TABS tablet Take 1 tablet (5 mg total) by mouth 2 (two) times daily. 180 tablet 2  . atorvastatin (LIPITOR) 40 MG tablet Take 40 mg by mouth every evening.    . betamethasone dipropionate 0.05 % cream Apply topically.    . clindamycin (CLEOCIN) 300 MG capsule Take 900 mg by mouth See admin instructions. Take 3 capsules (900 mg) by mouth 1 hour prior to dental work    . dofetilide (TIKOSYN) 500 MCG capsule TAKE 1 CAPSULE BY MOUTH TWICE A  DAY 60 capsule 6  . DUEXIS 800-26.6 MG TABS Take 1 tablet by mouth daily as needed (pain.).     Marland Kitchen EPIPEN 2-PAK 0.3 MG/0.3ML SOAJ injection Inject 0.3 mg into the muscle as needed for anaphylaxis.   3  . famotidine (PEPCID) 40 MG tablet Take 40 mg by mouth 2 (two) times daily.    . fluticasone (FLONASE) 50 MCG/ACT nasal spray Place 1 spray into both nostrils daily as needed for allergies.    Marland Kitchen KLOR-CON M20 20 MEQ tablet TAKE 1 TABLET TWICE A DAY 180 tablet 1  . losartan (COZAAR) 100 MG tablet Take 100 mg by mouth daily.     . metoprolol tartrate (LOPRESSOR) 25 MG tablet Take 25-50 mg by mouth See admin instructions. Take 25 mg in the morning and 50 mg at night    . nystatin (MYCOSTATIN/NYSTOP) powder Apply 1 application topically 2 (two) times daily as needed (skin irritation.).     Marland Kitchen Probiotic CAPS Take 1 capsule by mouth daily.    Marland Kitchen spironolactone (ALDACTONE) 25 MG tablet Take 1 tablet (25 mg total) by mouth daily. 90 tablet 3  . tadalafil (ADCIRCA/CIALIS) 20 MG tablet Take 20 mg by mouth daily as needed for erectile dysfunction.      No current facility-administered medications for this visit.    Physical Exam: Vitals:   03/21/20 1629  BP: 128/80  Pulse: (!) 58  SpO2: 97%  Weight: (!) 302 lb (137 kg)  Height: 6\' 2"  (1.88 m)    GEN- The patient is well appearing, alert and oriented x 3 today.   Head- normocephalic, atraumatic Eyes-  Sclera clear, conjunctiva pink Ears- hearing intact Oropharynx- clear Lungs- Clear to ausculation bilaterally, normal work of breathing Heart- Regular rate and rhythm, no murmurs, rubs or gallops, PMI not laterally displaced GI- soft, NT, ND, + BS Extremities- no clubbing, cyanosis, or edema  EKG tracing ordered today is personally reviewed and shows sinus rhythm  Assessment and Plan:  1. Persistent atrial fibrillation Doing well s/p ablation chads2vasc score is 1 We discussed at length He wishes to continue eliquis for now He wishes to  continue tikosyn but may stop this on return if no further afib Reduce metoprolol to 25mg  BID and continue to wean as tolerated  2. HTN Stable No change required today  3. Morbid obesity Body mass index is 38.77 kg/m. Lifestyle modification is advised  4. OSA Compliance with CPAP is advised  Return to see me in 3 months  MD, [facility] 03/21/2020 4:40 PM

## 2020-03-21 NOTE — Patient Instructions (Addendum)
Medication Instructions:  Decrease your Metoprolol 25 mg two daily  *If you need a refill on your cardiac medications before your next appointment, please call your pharmacy*  Lab Work: None ordered.  If you have labs (blood work) drawn today and your tests are completely normal, you will receive your results only by: Marland Kitchen MyChart Message (if you have MyChart) OR . A paper copy in the mail If you have any lab test that is abnormal or we need to change your treatment, we will call you to review the results.  Testing/Procedures: None ordered.  Follow-Up: At [facility], you and your health needs are our priority.  As part of our continuing mission to provide you with exceptional heart care, we have created designated Provider Care Teams.  These Care Teams include your primary Cardiologist (physician) and Advanced Practice Providers (APPs -  Physician Assistants and Nurse Practitioners) who all work together to provide you with the care you need, when you need it.  We recommend signing up for the patient portal called "MyChart".  Sign up information is provided on this After Visit Summary.  MyChart is used to connect with patients for Virtual Visits (Telemedicine).  Patients are able to view lab/test results, encounter notes, upcoming appointments, etc.  Non-urgent messages can be sent to your provider as well.   To learn more about what you can do with MyChart, go to [email or web address].    Your next appointment:   Your physician wants you to follow-up in: 06/23/19 at 10:45 am with Dr. Johney Frame.     Other Instructions:

## 2020-04-14 ENCOUNTER — Other Ambulatory Visit: Payer: Self-pay | Admitting: Nurse Practitioner

## 2020-04-23 ENCOUNTER — Other Ambulatory Visit (HOSPITAL_COMMUNITY): Payer: Self-pay | Admitting: Nurse Practitioner

## 2020-05-26 ENCOUNTER — Ambulatory Visit: Payer: Managed Care, Other (non HMO) | Admitting: Vascular Surgery

## 2020-06-22 ENCOUNTER — Ambulatory Visit: Payer: Managed Care, Other (non HMO) | Admitting: Internal Medicine

## 2020-07-13 ENCOUNTER — Ambulatory Visit (INDEPENDENT_AMBULATORY_CARE_PROVIDER_SITE_OTHER): Payer: Managed Care, Other (non HMO) | Admitting: Internal Medicine

## 2020-07-13 ENCOUNTER — Other Ambulatory Visit: Payer: Self-pay

## 2020-07-13 ENCOUNTER — Encounter: Payer: Self-pay | Admitting: Internal Medicine

## 2020-07-13 VITALS — BP 112/84 | HR 66 | Ht 74.0 in | Wt 301.2 lb

## 2020-07-13 DIAGNOSIS — I4819 Other persistent atrial fibrillation: Secondary | ICD-10-CM

## 2020-07-13 DIAGNOSIS — G4733 Obstructive sleep apnea (adult) (pediatric): Secondary | ICD-10-CM | POA: Diagnosis not present

## 2020-07-13 DIAGNOSIS — I10 Essential (primary) hypertension: Secondary | ICD-10-CM | POA: Diagnosis not present

## 2020-07-13 MED ORDER — METOPROLOL TARTRATE 25 MG PO TABS: 25.0000 mg | ORAL_TABLET | Freq: Every day | ORAL | 0 refills | Status: DC

## 2020-07-13 NOTE — Patient Instructions (Signed)
Medication Instructions:  Reduce Metoprolol tartrate to 25 mg one a day for 2 weeks, then stop. Your physician recommends that you continue on your current medications as directed. Please refer to the Current Medication list given to you today.  Labwork: None ordered.  Testing/Procedures: None ordered.  Follow-Up: Your physician wants you to follow-up in: 3 month follow up with Dr. Johney Frame.   Any Other Special Instructions Will Be Listed Below (If Applicable).  If you need a refill on your cardiac medications before your next appointment, please call your pharmacy.

## 2020-07-13 NOTE — Progress Notes (Signed)
PCP: Daisy Floro, MD   Primary EP: Garrett Hanson is a 60 y.o. male who presents today for routine electrophysiology followup.  Since last being seen in our clinic, the patient reports doing very well. He fell on the ice and injured his knee since I saw him last.  Required knee surgery .  Very rare afib.  he has stopped tikosyn. Today, he denies symptoms of palpitations, chest pain, shortness of breath,  lower extremity edema, dizziness, presyncope, or syncope.  The patient is otherwise without complaint today.   Past Medical History:  Diagnosis Date  . Arthritis    "left knee; probably in the shoulders; left hip" (02/05/2017)  . Chronic bronchitis (HCC)    "get it ~ q other year" (02/05/2017)  . Chronic urticaria   . CKD (chronic kidney disease), stage II    pt denies this hx on 02/05/2017  . Complication of anesthesia    pt states awaken during colonscopy   . Dysrhythmia    a fib  . Essential hypertension   . History of blood transfusion 1961  . History of common duodenal ulcer 1980  . History of kidney stones   . Lymphadenopathy    a. evaluated by heme-onc 04/2016, under surveillance.  . OSA on CPAP dx'd ~ 09/2016  . Persistent atrial fibrillation (HCC)   . Pneumonia    "X2" (02/05/2017)  . Pure hypercholesterolemia    Past Surgical History:  Procedure Laterality Date  . ANTERIOR CERVICAL DECOMP/DISCECTOMY FUSION  04/2005   "used piece from my hip"  . ATRIAL FIBRILLATION ABLATION N/A 12/22/2019   Procedure: ATRIAL FIBRILLATION ABLATION;  Surgeon: Hillis Range, MD;  [facility];  Service: Cardiovascular;  Laterality: N/A;  . CARDIOVERSION N/A 01/14/2017   Procedure: CARDIOVERSION;  Surgeon: Jake Bathe, MD;  Location: [facility] ENDOSCOPY;  Service: Cardiovascular;  Laterality: N/A;  . CARDIOVERSION N/A 02/07/2017   Procedure: CARDIOVERSION;  Surgeon: Chilton Si, MD;  Location: [facility] ENDOSCOPY;  Service: Cardiovascular;  Laterality: N/A;   . CARDIOVERSION N/A 02/10/2018   Procedure: CARDIOVERSION;  Surgeon: Thurmon Fair, MD;  Location: [facility] ENDOSCOPY;  Service: Cardiovascular;  Laterality: N/A;  . CARDIOVERSION N/A 02/02/2019   Procedure: CARDIOVERSION;  Surgeon: Lars Masson, MD;  Location: [facility] ENDOSCOPY;  Service: Cardiovascular;  Laterality: N/A;  . CARDIOVERSION N/A 08/26/2019   Procedure: CARDIOVERSION;  Surgeon: Elease Hashimoto Deloris Ping, MD;  Location: [facility] ENDOSCOPY;  Service: Cardiovascular;  Laterality: N/A;  . COLONOSCOPY W/ BIOPSIES AND POLYPECTOMY  ~ 2012   "benign"  . ELBOW SURGERY Right    "Tommy John surgery"  . EXTRACORPOREAL SHOCK WAVE LITHOTRIPSY  1990s  . INGUINAL HERNIA REPAIR Right 1980  . JOINT REPLACEMENT     left knee  . KNEE ARTHROSCOPY Right ~ 1990  . SHOULDER ARTHROSCOPY W/ ROTATOR CUFF REPAIR Right 2000s X 2  . TONSILLECTOMY     "I think I did"  . TOTAL HIP ARTHROPLASTY Left 06/04/2017   Procedure: LEFT TOTAL HIP ARTHROPLASTY ANTERIOR APPROACH;  Surgeon: Durene Romans, MD;  Location: [facility] ORS;  Service: Orthopedics;  Laterality: Left;  . TOTAL KNEE ARTHROPLASTY Left 02/07/2015   Procedure: LEFT TOTAL KNEE ARTHROPLASTY;  Surgeon: Durene Romans, MD;  Location: [facility]S;  Service: Orthopedics;  Laterality: Left;    ROS- all systems are reviewed and negatives except as per HPI above  Current Outpatient Medications  Medication Sig Dispense Refill  . albuterol (PROVENTIL HFA;VENTOLIN HFA) 108 (90 Base) MCG/ACT inhaler Inhale  1-2 puffs into the lungs as needed for wheezing or shortness of breath.     Marland Kitchen apixaban (ELIQUIS) 5 MG TABS tablet Take 1 tablet (5 mg total) by mouth 2 (two) times daily. 180 tablet 2  . atorvastatin (LIPITOR) 40 MG tablet Take 40 mg by mouth every evening.    . betamethasone dipropionate 0.05 % cream Apply topically.    . clindamycin (CLEOCIN) 300 MG capsule Take 900 mg by mouth See admin instructions. Take 3 capsules (900 mg) by mouth 1 hour prior to dental work    . EPIPEN 2-PAK 0.3  MG/0.3ML SOAJ injection Inject 0.3 mg into the muscle as needed for anaphylaxis.   3  . famotidine (PEPCID) 40 MG tablet Take 40 mg by mouth 2 (two) times daily.    . fluticasone (FLONASE) 50 MCG/ACT nasal spray Place 1 spray into both nostrils daily as needed for allergies.    Marland Kitchen KLOR-CON M20 20 MEQ tablet TAKE 1 TABLET TWICE A DAY 180 tablet 1  . losartan (COZAAR) 100 MG tablet Take 100 mg by mouth daily.     . metoprolol tartrate (LOPRESSOR) 25 MG tablet Take 1 tablet (25 mg total) by mouth 2 (two) times daily. 180 tablet 3  . nystatin (MYCOSTATIN/NYSTOP) powder Apply 1 application topically 2 (two) times daily as needed (skin irritation.).     Marland Kitchen Probiotic CAPS Take 1 capsule by mouth daily.    Marland Kitchen spironolactone (ALDACTONE) 25 MG tablet TAKE 1 TABLET DAILY 90 tablet 3  . tadalafil (ADCIRCA/CIALIS) 20 MG tablet Take 20 mg by mouth daily as needed for erectile dysfunction.      No current facility-administered medications for this visit.    Physical Exam: Vitals:   07/13/20 1530  BP: 112/84  Pulse: 66  SpO2: 97%  Weight: (!) 301 lb 3.2 oz (136.6 kg)  Height: 6\' 2"  (1.88 m)    GEN- The patient is well appearing, alert and oriented x 3 today.   Head- normocephalic, atraumatic Eyes-  Sclera clear, conjunctiva pink Ears- hearing intact Oropharynx- clear Lungs- Clear to ausculation bilaterally, normal work of breathing Heart- Regular rate and rhythm, no murmurs, rubs or gallops, PMI not laterally displaced GI- soft, NT, ND, + BS Extremities- no clubbing, cyanosis, or edema  Wt Readings from Last 3 Encounters:  07/13/20 (!) 301 lb 3.2 oz (136.6 kg)  03/21/20 (!) 302 lb (137 kg)  02/24/20 298 lb 6.4 oz (135.4 kg)    EKG tracing ordered today is personally reviewed and shows sinus rhythm  Assessment and Plan:  1. Persistent afib Doing well s/p ablation chads2vasc score is 1. We will stop eliquis He has already discontinued tikosyn He will wean metoprolol to off  2.  HTN Stable No change required today  3. OSA Compliance with CPAP advised  4. Obesity Body mass index is 38.67 kg/m. Lifestyle modification encouraged  Risks, benefits and potential toxicities for medications prescribed and/or refilled reviewed with patient today.   Return in 3 months  13/03/21 MD, [facility] 07/13/2020 3:39 PM

## 2020-08-18 ENCOUNTER — Other Ambulatory Visit (HOSPITAL_COMMUNITY): Payer: Self-pay | Admitting: Nurse Practitioner

## 2020-10-19 ENCOUNTER — Ambulatory Visit (INDEPENDENT_AMBULATORY_CARE_PROVIDER_SITE_OTHER): Payer: Managed Care, Other (non HMO) | Admitting: Internal Medicine

## 2020-10-19 ENCOUNTER — Encounter: Payer: Self-pay | Admitting: Internal Medicine

## 2020-10-19 ENCOUNTER — Other Ambulatory Visit: Payer: Self-pay

## 2020-10-19 VITALS — BP 134/82 | HR 70 | Ht 74.0 in | Wt 307.4 lb

## 2020-10-19 DIAGNOSIS — I4819 Other persistent atrial fibrillation: Secondary | ICD-10-CM | POA: Diagnosis not present

## 2020-10-19 DIAGNOSIS — G4733 Obstructive sleep apnea (adult) (pediatric): Secondary | ICD-10-CM | POA: Diagnosis not present

## 2020-10-19 DIAGNOSIS — I10 Essential (primary) hypertension: Secondary | ICD-10-CM

## 2020-10-19 MED ORDER — POTASSIUM CHLORIDE CRYS ER 20 MEQ PO TBCR: 20.0000 meq | EXTENDED_RELEASE_TABLET | Freq: Every day | ORAL | 3 refills | Status: DC

## 2020-10-19 NOTE — Patient Instructions (Addendum)
Medication Instructions:  Reduce Potassium to 20 meq daily Your physician recommends that you continue on your current medications as directed. Please refer to the Current Medication list given to you today.  Labwork: BMET  Testing/Procedures: None ordered.  Follow-Up: Your physician wants you to follow-up in: 03/03/21 at 3:45 pm   Any Other Special Instructions Will Be Listed Below (If Applicable).  If you need a refill on your cardiac medications before your next appointment, please call your pharmacy.

## 2020-10-19 NOTE — Progress Notes (Signed)
PCP: Daisy Floro, MD   Primary EP: Dr Aurea Graff Allnutt is a 60 y.o. male who presents today for routine electrophysiology followup.  Since last being seen in our clinic, the patient reports doing very well.  Today, he denies symptoms of palpitations, chest pain, shortness of breath,  lower extremity edema, dizziness, presyncope, or syncope.  The patient is otherwise without complaint today.   Past Medical History:  Diagnosis Date   Arthritis    "left knee; probably in the shoulders; left hip" (02/05/2017)   Chronic bronchitis (HCC)    "get it ~ q other year" (02/05/2017)   Chronic urticaria    CKD (chronic kidney disease), stage II    pt denies this hx on 02/05/2017   Complication of anesthesia    pt states awaken during colonscopy    Dysrhythmia    a fib   Essential hypertension    History of blood transfusion 1961   History of common duodenal ulcer 1980   History of kidney stones    Lymphadenopathy    a. evaluated by heme-onc 04/2016, under surveillance.   OSA on CPAP dx'd ~ 09/2016   Persistent atrial fibrillation (HCC)    Pneumonia    "X2" (02/05/2017)   Pure hypercholesterolemia    Past Surgical History:  Procedure Laterality Date   ANTERIOR CERVICAL DECOMP/DISCECTOMY FUSION  04/2005   "used piece from my hip"   ATRIAL FIBRILLATION ABLATION N/A 12/22/2019   Procedure: ATRIAL FIBRILLATION ABLATION;  Surgeon: Hillis Range, MD;  [facility]: [facility];  Service: Cardiovascular;  Laterality: N/A;   CARDIOVERSION N/A 01/14/2017   Procedure: CARDIOVERSION;  Surgeon: Jake Bathe, MD;  Location: [facility] ENDOSCOPY;  Service: Cardiovascular;  Laterality: N/A;   CARDIOVERSION N/A 02/07/2017   Procedure: CARDIOVERSION;  Surgeon: Chilton Si, MD;  Location: [facility] ENDOSCOPY;  Service: Cardiovascular;  Laterality: N/A;   CARDIOVERSION N/A 02/10/2018   Procedure: CARDIOVERSION;  Surgeon: Thurmon Fair, MD;  Location: [facility] ENDOSCOPY;  Service: Cardiovascular;   Laterality: N/A;   CARDIOVERSION N/A 02/02/2019   Procedure: CARDIOVERSION;  Surgeon: Lars Masson, MD;  Location: [facility] ENDOSCOPY;  Service: Cardiovascular;  Laterality: N/A;   CARDIOVERSION N/A 08/26/2019   Procedure: CARDIOVERSION;  Surgeon: Elease Hashimoto Deloris Ping, MD;  Location: [facility] ENDOSCOPY;  Service: Cardiovascular;  Laterality: N/A;   COLONOSCOPY W/ BIOPSIES AND POLYPECTOMY  ~ 2012   "benign"   ELBOW SURGERY Right    "Tommy John surgery"   EXTRACORPOREAL SHOCK WAVE LITHOTRIPSY  1990s   INGUINAL HERNIA REPAIR Right 1980   JOINT REPLACEMENT     left knee   KNEE ARTHROSCOPY Right ~ 1990   SHOULDER ARTHROSCOPY W/ ROTATOR CUFF REPAIR Right 2000s X 2   TONSILLECTOMY     "I think I did"   TOTAL HIP ARTHROPLASTY Left 06/04/2017   Procedure: LEFT TOTAL HIP ARTHROPLASTY ANTERIOR APPROACH;  Surgeon: Durene Romans, MD;  Location: [facility];  Service: Orthopedics;  Laterality: Left;   TOTAL KNEE ARTHROPLASTY Left 02/07/2015   Procedure: LEFT TOTAL KNEE ARTHROPLASTY;  Surgeon: Durene Romans, MD;  Location: [facility];  Service: Orthopedics;  Laterality: Left;    ROS- all systems are reviewed and negatives except as per HPI above  Current Outpatient Medications  Medication Sig Dispense Refill   albuterol (PROVENTIL HFA;VENTOLIN HFA) 108 (90 Base) MCG/ACT inhaler Inhale 1-2 puffs into the lungs as needed for wheezing or shortness of breath.      apixaban (ELIQUIS) 5 MG TABS tablet Take 1 tablet (  5 mg total) by mouth 2 (two) times daily. 180 tablet 2   atorvastatin (LIPITOR) 40 MG tablet Take 40 mg by mouth every evening.     betamethasone dipropionate 0.05 % cream Apply topically.     clindamycin (CLEOCIN) 300 MG capsule Take 900 mg by mouth See admin instructions. Take 3 capsules (900 mg) by mouth 1 hour prior to dental work     EPIPEN 2-PAK 0.3 MG/0.3ML SOAJ injection Inject 0.3 mg into the muscle as needed for anaphylaxis.   3   famotidine (PEPCID) 40 MG tablet Take 40 mg by mouth 2 (two) times daily.      fluticasone (FLONASE) 50 MCG/ACT nasal spray Place 1 spray into both nostrils daily as needed for allergies.     KLOR-CON M20 20 MEQ tablet TAKE 1 TABLET TWICE A DAY 180 tablet 3   losartan (COZAAR) 100 MG tablet Take 100 mg by mouth daily.      nystatin (MYCOSTATIN/NYSTOP) powder Apply 1 application topically 2 (two) times daily as needed (skin irritation.).      Probiotic CAPS Take 1 capsule by mouth daily.     spironolactone (ALDACTONE) 25 MG tablet TAKE 1 TABLET DAILY 90 tablet 3   tadalafil (ADCIRCA/CIALIS) 20 MG tablet Take 20 mg by mouth daily as needed for erectile dysfunction.      metoprolol tartrate (LOPRESSOR) 25 MG tablet Take 1 tablet (25 mg total) by mouth daily for 14 days. 14 tablet 0   No current facility-administered medications for this visit.    Physical Exam: Vitals:   10/19/20 1619  BP: 134/82  Pulse: 70  SpO2: 95%  Weight: (!) 307 lb 6.4 oz (139.4 kg)  Height: 6\' 2"  (1.88 m)    GEN- The patient is well appearing, alert and oriented x 3 today.   Head- normocephalic, atraumatic Eyes-  Sclera clear, conjunctiva pink Ears- hearing intact Oropharynx- clear Lungs- Clear to ausculation bilaterally, normal work of breathing Heart- Regular rate and rhythm, no murmurs, rubs or gallops, PMI not laterally displaced GI- soft, NT, ND, + BS Extremities- no clubbing, cyanosis, or edema  Wt Readings from Last 3 Encounters:  10/19/20 (!) 307 lb 6.4 oz (139.4 kg)  07/13/20 (!) 301 lb 3.2 oz (136.6 kg)  03/21/20 (!) 302 lb (137 kg)    EKG tracing ordered today is personally reviewed and shows sinus rhythm  Assessment and Plan:  Persistent atrial fibrillation Doing well post ablation off tikosyn Chads2vasc score is 1.  No longer on OAC No changes today  2. HTN Stable No change required today  3. OSA Compliance with CPAP advised  4. Obesity Body mass index is 39.47 kg/m. Lifestyle modification discussed today  Risks, benefits and potential toxicities  for medications prescribed and/or refilled reviewed with patient today.   Return to see me in 4 months  03/23/20 MD, [facility] 10/19/2020 4:44 PM

## 2020-10-20 LAB — BASIC METABOLIC PANEL
BUN/Creatinine Ratio: 12 (ref 10–24)
BUN: 13 mg/dL (ref 8–27)
CO2: 22 mmol/L (ref 20–29)
Calcium: 9.5 mg/dL (ref 8.6–10.2)
Chloride: 100 mmol/L (ref 96–106)
Creatinine, Ser: 1.13 mg/dL (ref 0.76–1.27)
Glucose: 90 mg/dL (ref 65–99)
Potassium: 4.9 mmol/L (ref 3.5–5.2)
Sodium: 138 mmol/L (ref 134–144)
eGFR: 74 mL/min/{1.73_m2} (ref >59)

## 2021-03-03 ENCOUNTER — Ambulatory Visit (INDEPENDENT_AMBULATORY_CARE_PROVIDER_SITE_OTHER): Payer: Managed Care, Other (non HMO) | Admitting: Internal Medicine

## 2021-03-03 ENCOUNTER — Other Ambulatory Visit: Payer: Self-pay

## 2021-03-03 VITALS — BP 114/68 | HR 84 | Ht 74.0 in | Wt 300.8 lb

## 2021-03-03 DIAGNOSIS — I10 Essential (primary) hypertension: Secondary | ICD-10-CM | POA: Diagnosis not present

## 2021-03-03 DIAGNOSIS — G4733 Obstructive sleep apnea (adult) (pediatric): Secondary | ICD-10-CM

## 2021-03-03 DIAGNOSIS — I4819 Other persistent atrial fibrillation: Secondary | ICD-10-CM | POA: Diagnosis not present

## 2021-03-03 NOTE — Patient Instructions (Addendum)
Medication Instructions:  Stop Metoprolol tartrate  Stop potassium Your physician recommends that you continue on your current medications as directed. Please refer to the Current Medication list given to you today. *If you need a refill on your cardiac medications before your next appointment, please call your pharmacy*  Lab Work: None. If you have labs (blood work) drawn today and your tests are completely normal, you will receive your results only by: MyChart Message (if you have MyChart) OR A paper copy in the mail If you have any lab test that is abnormal or we need to change your treatment, we will call you to review the results.  Testing/Procedures: None.  Follow-Up: At [facility], you and your health needs are our priority.  As part of our continuing mission to provide you with exceptional heart care, we have created designated Provider Care Teams.  These Care Teams include your primary Cardiologist (physician) and Advanced Practice Providers (APPs -  Physician Assistants and Nurse Practitioners) who all work together to provide you with the care you need, when you need it.  Your physician wants you to follow-up in:  6 months in the Afib Clinic. They will contact you to schedule.     You will receive a reminder letter in the mail two months in advance. If you don't receive a letter, please call our office to schedule the follow-up appointment.  We recommend signing up for the patient portal called "MyChart".  Sign up information is provided on this After Visit Summary.  MyChart is used to connect with patients for Virtual Visits (Telemedicine).  Patients are able to view lab/test results, encounter notes, upcoming appointments, etc.  Non-urgent messages can be sent to your provider as well.   To learn more about what you can do with MyChart, go to [email or web address].    Any Other Special Instructions Will Be Listed Below (If Applicable).

## 2021-03-03 NOTE — Progress Notes (Signed)
PCP: Daisy Floro, MD   Primary EP: Dr Aurea Graff Raspberry is a 60 y.o. male who presents today for routine electrophysiology followup.  Since last being seen in our clinic, the patient reports doing very well.  Today, he denies symptoms of palpitations, chest pain, shortness of breath,  lower extremity edema, dizziness, presyncope, or syncope.  The patient is otherwise without complaint today.   Past Medical History:  Diagnosis Date   Arthritis    "left knee; probably in the shoulders; left hip" (02/05/2017)   Chronic bronchitis (HCC)    "get it ~ q other year" (02/05/2017)   Chronic urticaria    CKD (chronic kidney disease), stage II    pt denies this hx on 02/05/2017   Complication of anesthesia    pt states awaken during colonscopy    Dysrhythmia    a fib   Essential hypertension    History of blood transfusion 1961   History of common duodenal ulcer 1980   History of kidney stones    Lymphadenopathy    a. evaluated by heme-onc 04/2016, under surveillance.   OSA on CPAP dx'd ~ 09/2016   Persistent atrial fibrillation (HCC)    Pneumonia    "X2" (02/05/2017)   Pure hypercholesterolemia    Past Surgical History:  Procedure Laterality Date   ANTERIOR CERVICAL DECOMP/DISCECTOMY FUSION  04/2005   "used piece from my hip"   ATRIAL FIBRILLATION ABLATION N/A 12/22/2019   Procedure: ATRIAL FIBRILLATION ABLATION;  Surgeon: Hillis Range, MD;  [facility]: [facility] LAB;  Service: Cardiovascular;  Laterality: N/A;   CARDIOVERSION N/A 01/14/2017   Procedure: CARDIOVERSION;  Surgeon: Jake Bathe, MD;  Location: [facility] ENDOSCOPY;  Service: Cardiovascular;  Laterality: N/A;   CARDIOVERSION N/A 02/07/2017   Procedure: CARDIOVERSION;  Surgeon: Chilton Si, MD;  Location: [facility] ENDOSCOPY;  Service: Cardiovascular;  Laterality: N/A;   CARDIOVERSION N/A 02/10/2018   Procedure: CARDIOVERSION;  Surgeon: Thurmon Fair, MD;  Location: [facility] ENDOSCOPY;  Service: Cardiovascular;   Laterality: N/A;   CARDIOVERSION N/A 02/02/2019   Procedure: CARDIOVERSION;  Surgeon: Lars Masson, MD;  Location: [facility] ENDOSCOPY;  Service: Cardiovascular;  Laterality: N/A;   CARDIOVERSION N/A 08/26/2019   Procedure: CARDIOVERSION;  Surgeon: Elease Hashimoto Deloris Ping, MD;  Location: [facility] ENDOSCOPY;  Service: Cardiovascular;  Laterality: N/A;   COLONOSCOPY W/ BIOPSIES AND POLYPECTOMY  ~ 2012   "benign"   ELBOW SURGERY Right    "Tommy John surgery"   EXTRACORPOREAL SHOCK WAVE LITHOTRIPSY  1990s   INGUINAL HERNIA REPAIR Right 1980   JOINT REPLACEMENT     left knee   KNEE ARTHROSCOPY Right ~ 1990   SHOULDER ARTHROSCOPY W/ ROTATOR CUFF REPAIR Right 2000s X 2   TONSILLECTOMY     "I think I did"   TOTAL HIP ARTHROPLASTY Left 06/04/2017   Procedure: LEFT TOTAL HIP ARTHROPLASTY ANTERIOR APPROACH;  Surgeon: Durene Romans, MD;  Location: [facility];  Service: Orthopedics;  Laterality: Left;   TOTAL KNEE ARTHROPLASTY Left 02/07/2015   Procedure: LEFT TOTAL KNEE ARTHROPLASTY;  Surgeon: Durene Romans, MD;  Location: [facility];  Service: Orthopedics;  Laterality: Left;    ROS- all systems are reviewed and negatives except as per HPI above  Current Outpatient Medications  Medication Sig Dispense Refill   albuterol (PROVENTIL HFA;VENTOLIN HFA) 108 (90 Base) MCG/ACT inhaler Inhale 1-2 puffs into the lungs as needed for wheezing or shortness of breath.      atorvastatin (LIPITOR) 40 MG tablet Take 40 mg by  mouth every evening.     betamethasone dipropionate 0.05 % cream Apply topically.     EPIPEN 2-PAK 0.3 MG/0.3ML SOAJ injection Inject 0.3 mg into the muscle as needed for anaphylaxis.   3   famotidine (PEPCID) 40 MG tablet Take 40 mg by mouth 2 (two) times daily.     fluticasone (FLONASE) 50 MCG/ACT nasal spray Place 1 spray into both nostrils daily as needed for allergies.     losartan (COZAAR) 100 MG tablet Take 100 mg by mouth daily.      nystatin (MYCOSTATIN/NYSTOP) powder Apply 1 application topically 2 (two)  times daily as needed (skin irritation.).      potassium chloride SA (KLOR-CON M20) 20 MEQ tablet Take 1 tablet (20 mEq total) by mouth daily. 90 tablet 3   Probiotic CAPS Take 1 capsule by mouth daily.     spironolactone (ALDACTONE) 25 MG tablet TAKE 1 TABLET DAILY 90 tablet 3   tadalafil (ADCIRCA/CIALIS) 20 MG tablet Take 20 mg by mouth daily as needed for erectile dysfunction.      metoprolol tartrate (LOPRESSOR) 25 MG tablet Take 1 tablet (25 mg total) by mouth daily for 14 days. 14 tablet 0   No current facility-administered medications for this visit.    Physical Exam: Vitals:   03/03/21 1557  BP: 114/68  Pulse: 84  SpO2: 98%  Weight: (!) 300 lb 12.8 oz (136.4 kg)  Height: 6\' 2"  (1.88 m)    GEN- The patient is well appearing, alert and oriented x 3 today.   Head- normocephalic, atraumatic Eyes-  Sclera clear, conjunctiva pink Ears- hearing intact Oropharynx- clear Lungs- Clear to ausculation bilaterally, normal work of breathing Heart- Regular rate and rhythm, no murmurs, rubs or gallops, PMI not laterally displaced GI- soft, NT, ND, + BS Extremities- no clubbing, cyanosis, or edema  Wt Readings from Last 3 Encounters:  03/03/21 (!) 300 lb 12.8 oz (136.4 kg)  10/19/20 (!) 307 lb 6.4 oz (139.4 kg)  07/13/20 (!) 301 lb 3.2 oz (136.6 kg)    EKG tracing ordered today is personally reviewed and shows sinus  Assessment and Plan:  Persistent afib Well controlled post ablation off tikosyn Chads2vasc score is 1.  No longer requires [place] Stop metoprolol  2. HTN Stop metoprolol  3. Obesity Body mass index is 38.62 kg/m. Lifestyle modification is advised  4. OSA Uses CPAP  5. Hypokalemia Resolved Labs from [place] reviewed.  K 4.5 Stop Kdur Repeat bmet in 4 weeks  Followup in AF clinic in 6 months  Thompson Grayer MD, [facility] 03/03/2021 4:10 PM

## 2021-03-28 ENCOUNTER — Other Ambulatory Visit: Payer: Managed Care, Other (non HMO) | Admitting: *Deleted

## 2021-03-28 ENCOUNTER — Other Ambulatory Visit: Payer: Self-pay

## 2021-03-28 DIAGNOSIS — I4819 Other persistent atrial fibrillation: Secondary | ICD-10-CM

## 2021-03-28 DIAGNOSIS — I10 Essential (primary) hypertension: Secondary | ICD-10-CM

## 2021-03-28 DIAGNOSIS — G4733 Obstructive sleep apnea (adult) (pediatric): Secondary | ICD-10-CM

## 2021-03-29 LAB — BASIC METABOLIC PANEL
BUN/Creatinine Ratio: 12 (ref 10–24)
BUN: 15 mg/dL (ref 8–27)
CO2: 22 mmol/L (ref 20–29)
Calcium: 9.2 mg/dL (ref 8.6–10.2)
Chloride: 103 mmol/L (ref 96–106)
Creatinine, Ser: 1.23 mg/dL (ref 0.76–1.27)
Glucose: 105 mg/dL — ABNORMAL HIGH (ref 70–99)
Potassium: 5 mmol/L (ref 3.5–5.2)
Sodium: 139 mmol/L (ref 134–144)
eGFR: 67 mL/min/{1.73_m2} (ref >59)

## 2021-03-31 ENCOUNTER — Other Ambulatory Visit: Payer: Managed Care, Other (non HMO)

## 2021-04-11 ENCOUNTER — Other Ambulatory Visit (HOSPITAL_COMMUNITY): Payer: Self-pay | Admitting: Nurse Practitioner

## 2021-04-21 IMAGING — CT CT HEART MORPH/PULM VEIN W/ CM & W/O CA SCORE
3 of 5 series · 14 of 20 positions shown, 15 images · IV contrast (APPLIED)
Comparison: 05/07/2005 chest radiograph
COMPARISON: 05/07/2005 chest radiograph

Addendum:
EXAM:
OVER-READ INTERPRETATION  CT CHEST

The following report is an over-read performed by radiologist Dr.
Breiner Jose Migue [REDACTED] on 12/15/2019. This over-read
does not include interpretation of cardiac or coronary anatomy or
pathology. The coronary CTA interpretation by the cardiologist is
attached.
CLINICAL DATA: Atrial fibrillation
Cardiac CTA
MEDICATIONS:
No additional medications
TECHNIQUE: The patient was scanned on a Siemens [REDACTED]ice scanner. Gantry
rotation speed was 250 msecs. Collimation was 0.6 mm. A 100 kV
prospective scan was triggered in the ascending thoracic aorta at
35-75% of the R-R interval. Average HR during the scan was 60 bpm.
The 3D data set was interpreted on a dedicated work station using
MPR, MIP and VRT modes. A total of 80cc of contrast was used.

[Series 3: cascseq · axial · 0.39mm/px · z∈[-42,+104]mm · 3 of 74 slices shown, 4 images]
[im 1/74  vessel]
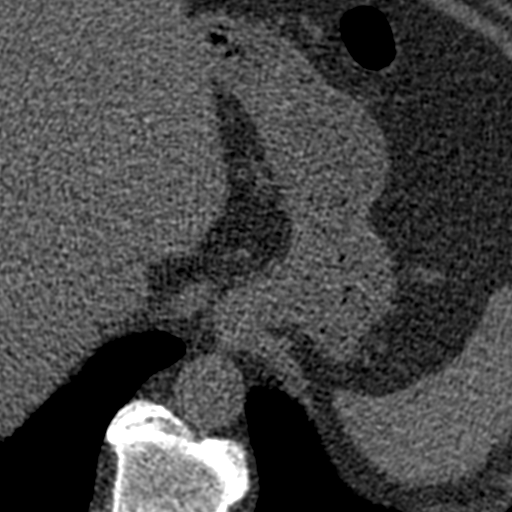
[im 1/74  lung]
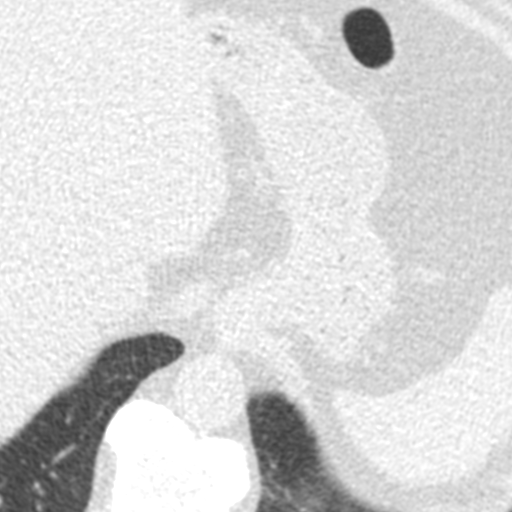
[im 37/74  vessel]
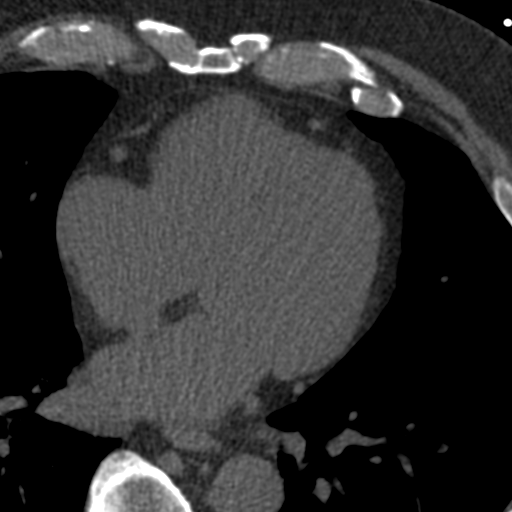
[im 74/74  vessel]
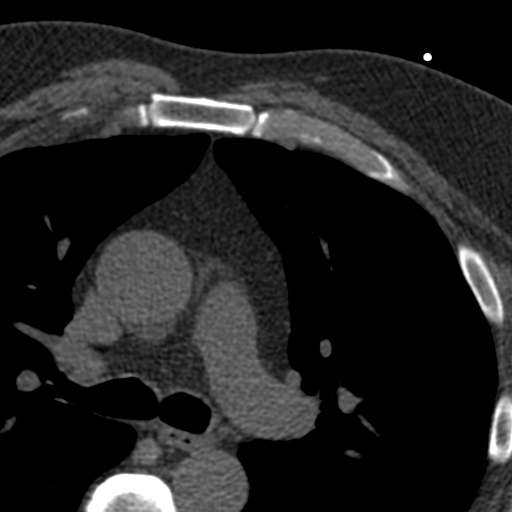

[Series 6: pv delay · axial · portal-venous · 0.39mm/px · z∈[+36,+78]mm · 3 of 168 slices shown]
[im 42/168  vessel]
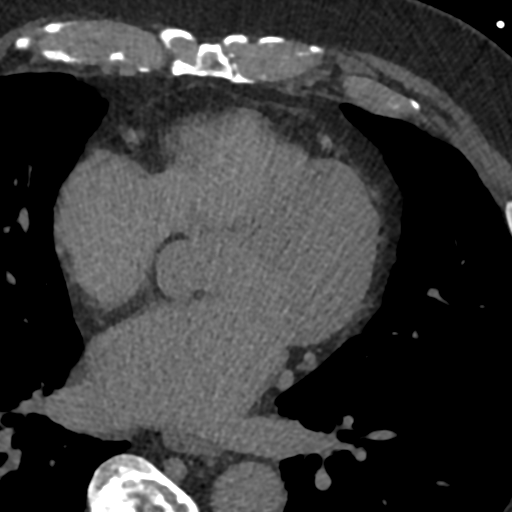
[im 84/168  vessel]
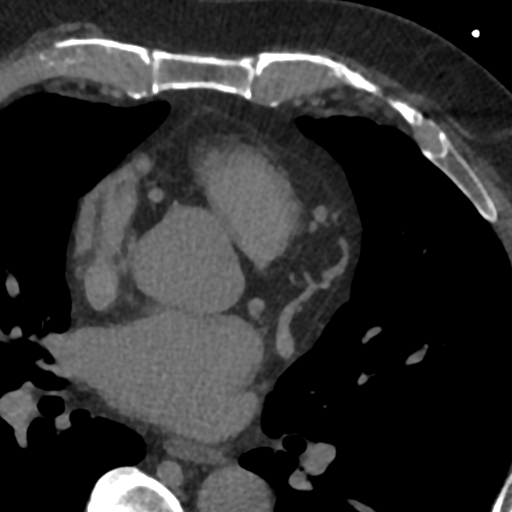
[im 126/168  vessel]
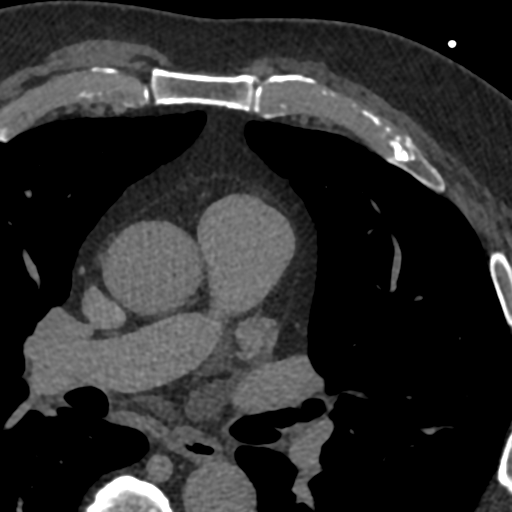

[Series 7: best syst · axial · 0.39mm/px · z∈[-18,+83]mm · 8 of 323 slices shown]
[im 36/323  vessel]
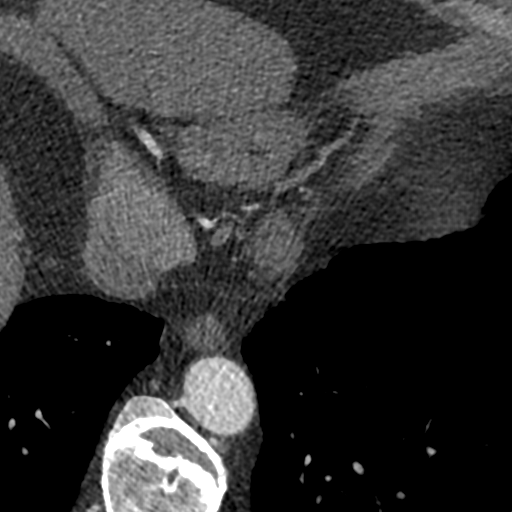
[im 72/323  vessel]
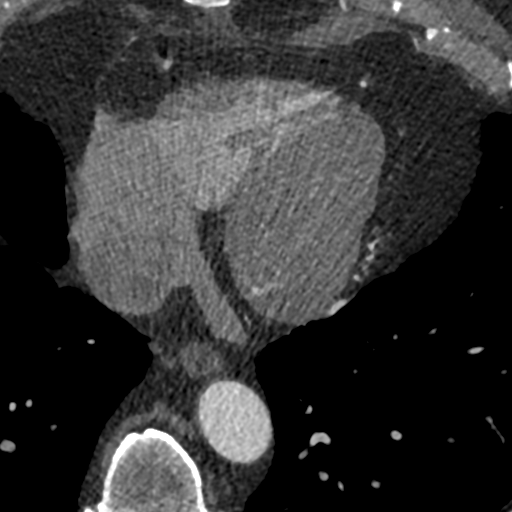
[im 108/323  vessel]
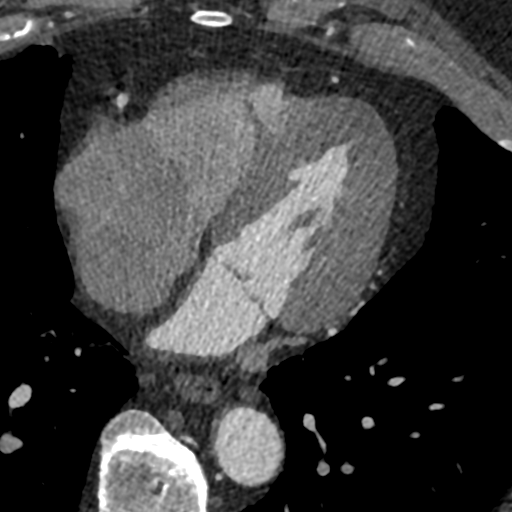
[im 144/323  vessel]
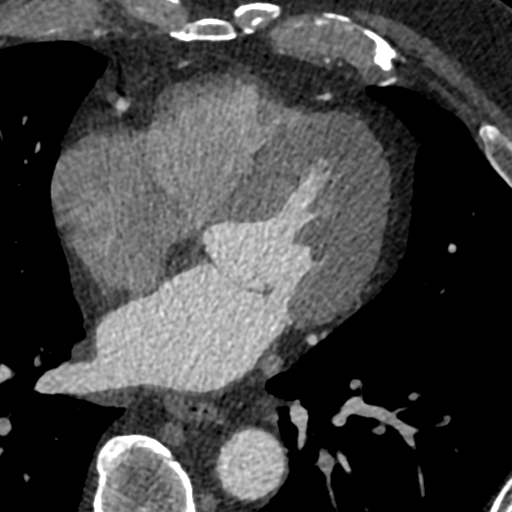
[im 179/323  vessel]
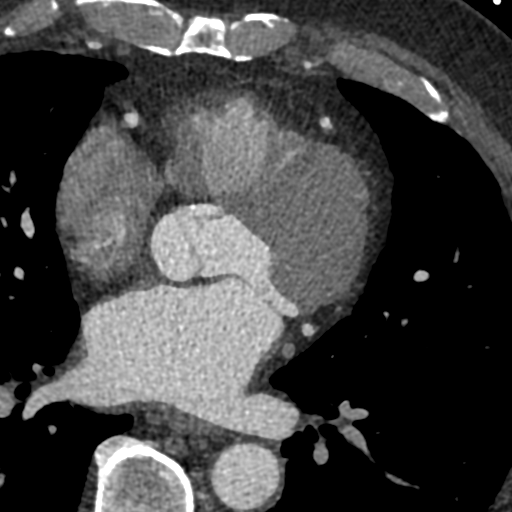
[im 215/323  vessel]
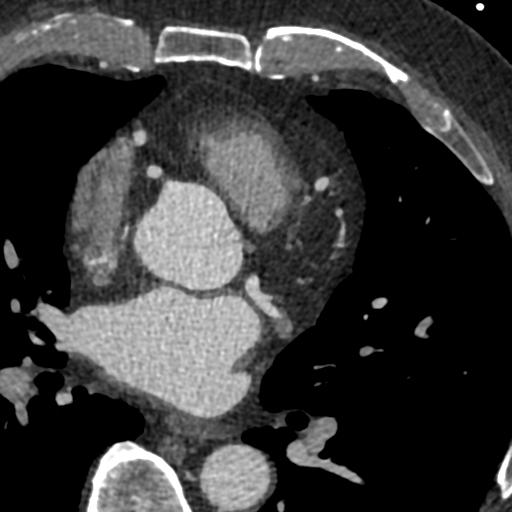
[im 251/323  vessel]
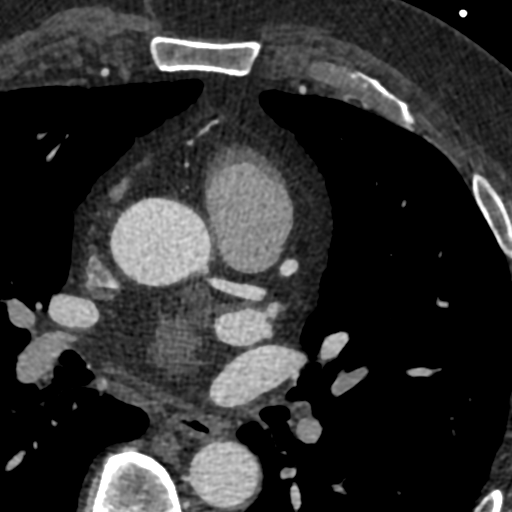
[im 287/323  vessel]
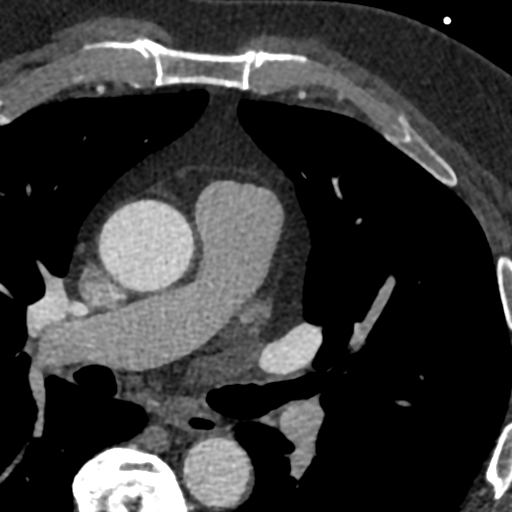

[14 of 20 positions shown; findings below may reference images not displayed]

FINDINGS: Vascular: Aortic atherosclerosis. Tortuous thoracic aorta. No
central pulmonary embolism, on this non-dedicated study.

Mediastinum/Nodes: No imaged thoracic adenopathy.

Lungs/Pleura: No pleural fluid.  Clear imaged lungs.

Upper Abdomen: Normal imaged portions of the liver, spleen.

Musculoskeletal: Advanced thoracic spondylosis.
IMPRESSION: 1. No acute findings in the imaged extracardiac chest.
2. Aortic Atherosclerosis (PUMOT-D4B.B).
FINDINGS: Non-cardiac: See separate report from [REDACTED].

Moderately dilated left atrium. No evidence for thrombus in the LA
or appendage.

Pulmonary veins drain normally to the left atrium.

LSPV 22.5 x 15 mm

LIPV 20 x 14 mm

RSPV 19 x 19 mm

RIPV 23 x 17 mm

Calcium Score: 4.8 Agatston units.

Coronary Arteries: Right dominant with no anomalies

LM: No plaque or stenosis.

LAD system: Mixed plaque mid LAD, minimal stenosis.

Circumflex system: No plaque or stenosis.

RCA system: No plaque or stenosis.
IMPRESSION: 1.  Pulmonary veins as noted above.

2.  No LA appendage thrombus.

3.  No significant coronary disease.

4. Coronary artery calcium score 4.8 Agatston units. This places the
patient in the 35th percentile for age and gender, suggesting low
risk for future cardiac events.

Klpigbb Moolman

*** End of Addendum ***
EXAM:
OVER-READ INTERPRETATION  CT CHEST

The following report is an over-read performed by radiologist Dr.
Breiner Jose Migue [REDACTED] on 12/15/2019. This over-read
does not include interpretation of cardiac or coronary anatomy or
pathology. The coronary CTA interpretation by the cardiologist is
attached.
FINDINGS: Vascular: Aortic atherosclerosis. Tortuous thoracic aorta. No
central pulmonary embolism, on this non-dedicated study.

Mediastinum/Nodes: No imaged thoracic adenopathy.

Lungs/Pleura: No pleural fluid.  Clear imaged lungs.

Upper Abdomen: Normal imaged portions of the liver, spleen.

Musculoskeletal: Advanced thoracic spondylosis.
IMPRESSION: 1. No acute findings in the imaged extracardiac chest.
2. Aortic Atherosclerosis (PUMOT-D4B.B).

## 2021-06-06 ENCOUNTER — Other Ambulatory Visit: Payer: Self-pay | Admitting: Internal Medicine

## 2021-12-14 ENCOUNTER — Ambulatory Visit
Admission: RE | Admit: 2021-12-14 | Discharge: 2021-12-14 | Disposition: A | Discharge status: Home / Self Care | Payer: Self-pay | Source: Ambulatory Visit | Attending: Family Medicine | Admitting: Family Medicine

## 2021-12-14 ENCOUNTER — Other Ambulatory Visit: Payer: Self-pay | Admitting: Family Medicine

## 2021-12-14 DIAGNOSIS — R059 Cough, unspecified: Secondary | ICD-10-CM

## 2022-05-01 ENCOUNTER — Other Ambulatory Visit (HOSPITAL_COMMUNITY): Payer: Self-pay | Admitting: Physician Assistant

## 2022-05-01 DIAGNOSIS — R1084 Generalized abdominal pain: Secondary | ICD-10-CM

## 2022-05-03 ENCOUNTER — Ambulatory Visit (HOSPITAL_BASED_OUTPATIENT_CLINIC_OR_DEPARTMENT_OTHER)
Admission: RE | Admit: 2022-05-03 | Discharge: 2022-05-03 | Disposition: A | Discharge status: Home / Self Care | Payer: 59 | Source: Ambulatory Visit | Attending: Physician Assistant | Admitting: Physician Assistant

## 2022-05-03 DIAGNOSIS — R1084 Generalized abdominal pain: Secondary | ICD-10-CM | POA: Diagnosis present

## 2022-05-03 MED ORDER — IOHEXOL 300 MG/ML  SOLN
100.0000 mL | Freq: Once | INTRAMUSCULAR | Status: AC | PRN
  Administered 2022-05-03: 100 mL via INTRAVENOUS

## 2022-05-21 ENCOUNTER — Telehealth: Payer: Self-pay

## 2022-05-21 NOTE — Telephone Encounter (Signed)
Called patient and he is now scheduled to see Coletta Memos, NP on Thursday 05/24/22 at 1:55 PM. Patient was informed to arrive at least 15-20 minutes prior to scheduled appointment time for registration. He verbalized understanding and all (if any) questions were answered.

## 2022-05-21 NOTE — Telephone Encounter (Signed)
   Pre-operative Risk Assessment    Patient Name: Garrett Hanson  DOB: Aug 23, 1960 MRN: [id number]      Request for Surgical Clearance    Procedure:   Colonoscopy  Date of Surgery:  Clearance 06/20/22                                 Surgeon:  Dr. Harmon Dun Surgeon's Group or Practice Name:  [facility] Phone number:  [phone] Fax number:  [phone]   Type of Clearance Requested:   - Medical    Type of Anesthesia:  Not Indicated   Additional requests/questions:  Please advise surgeon/provider what medications should be held.  Signed, Wonda Horner   05/21/2022, 3:44 PM

## 2022-05-21 NOTE — Telephone Encounter (Signed)
   Name: Larnce Schnackenberg Horsfall  DOB: [birth year]  MRN: [id number]  Primary Cardiologist: Sanda Klein, MD  Chart reviewed as part of pre-operative protocol coverage. Because of Espn Zeman Sivley's past medical history and time since last visit, he will require a follow-up in-office visit in order to better assess preoperative cardiovascular risk.  Pre-op covering staff: - Please schedule appointment and call patient to inform them. If patient already had an upcoming appointment within acceptable timeframe, please add "pre-op clearance" to the appointment notes so provider is aware. - Please contact requesting surgeon's office via preferred method (i.e, phone, fax) to inform them of need for appointment prior to surgery.    Trudi Ida, NP  05/21/2022, 3:50 PM

## 2022-05-22 NOTE — Progress Notes (Unsigned)
Cardiology Clinic Note   Patient Name: Garrett Hanson Date of Encounter: 05/24/2022  Primary Care Provider:  Daisy Floro, MD Primary Cardiologist:  Thurmon Fair, MD  Patient Profile    Garrett Hanson 62 year old male presents to the clinic today for follow-up evaluation of his atrial fibrillation, HTN, and preoperative cardiac evaluation.  Past Medical History    Past Medical History:  Diagnosis Date   Arthritis    "left knee; probably in the shoulders; left hip" (02/05/2017)   Chronic bronchitis (HCC)    "get it ~ q other year" (02/05/2017)   Chronic urticaria    CKD (chronic kidney disease), stage II    pt denies this hx on 02/05/2017   Complication of anesthesia    pt states awaken during colonscopy    Dysrhythmia    a fib   Essential hypertension    History of blood transfusion 1961   History of common duodenal ulcer 1980   History of kidney stones    Lymphadenopathy    a. evaluated by heme-onc 04/2016, under surveillance.   OSA on CPAP dx'd ~ 09/2016   Persistent atrial fibrillation (HCC)    Pneumonia    "X2" (02/05/2017)   Pure hypercholesterolemia    Past Surgical History:  Procedure Laterality Date   ANTERIOR CERVICAL DECOMP/DISCECTOMY FUSION  04/2005   "used piece from my hip"   ATRIAL FIBRILLATION ABLATION N/A 12/22/2019   Procedure: ATRIAL FIBRILLATION ABLATION;  Surgeon: Hillis Range, MD;  Location: [facility] INVASIVE CV LAB;  Service: Cardiovascular;  Laterality: N/A;   CARDIOVERSION N/A 01/14/2017   Procedure: CARDIOVERSION;  Surgeon: Jake Bathe, MD;  Location: [facility] ENDOSCOPY;  Service: Cardiovascular;  Laterality: N/A;   CARDIOVERSION N/A 02/07/2017   Procedure: CARDIOVERSION;  Surgeon: Chilton Si, MD;  Location: [facility] ENDOSCOPY;  Service: Cardiovascular;  Laterality: N/A;   CARDIOVERSION N/A 02/10/2018   Procedure: CARDIOVERSION;  Surgeon: Thurmon Fair, MD;  Location: [facility] ENDOSCOPY;  Service: Cardiovascular;  Laterality: N/A;    CARDIOVERSION N/A 02/02/2019   Procedure: CARDIOVERSION;  Surgeon: Lars Masson, MD;  Location: [facility] ENDOSCOPY;  Service: Cardiovascular;  Laterality: N/A;   CARDIOVERSION N/A 08/26/2019   Procedure: CARDIOVERSION;  Surgeon: Elease Hashimoto Deloris Ping, MD;  Location: [facility] ENDOSCOPY;  Service: Cardiovascular;  Laterality: N/A;   COLONOSCOPY W/ BIOPSIES AND POLYPECTOMY  ~ 2012   "benign"   ELBOW SURGERY Right    "Tommy John surgery"   EXTRACORPOREAL SHOCK WAVE LITHOTRIPSY  1990s   INGUINAL HERNIA REPAIR Right 1980   JOINT REPLACEMENT     left knee   KNEE ARTHROSCOPY Right ~ 1990   SHOULDER ARTHROSCOPY W/ ROTATOR CUFF REPAIR Right 2000s X 2   TONSILLECTOMY     "I think I did"   TOTAL HIP ARTHROPLASTY Left 06/04/2017   Procedure: LEFT TOTAL HIP ARTHROPLASTY ANTERIOR APPROACH;  Surgeon: Durene Romans, MD;  Location: [facility] ORS;  Service: Orthopedics;  Laterality: Left;   TOTAL KNEE ARTHROPLASTY Left 02/07/2015   Procedure: LEFT TOTAL KNEE ARTHROPLASTY;  Surgeon: Durene Romans, MD;  Location: [facility] ORS;  Service: Orthopedics;  Laterality: Left;    Allergies  Allergies  Allergen Reactions   Penicillins Anaphylaxis    Has patient had a PCN reaction causing immediate rash, facial/tongue/throat swelling, SOB or lightheadedness with hypotension: Yes Has patient had a PCN reaction causing severe rash involving mucus membranes or skin necrosis: No Has patient had a PCN reaction that required hospitalization:Yes Has patient had a PCN reaction occurring within the last 10  years: No If all of the above answers are "NO", then may proceed with Cephalosporin use.    Singulair [Montelukast Sodium] Swelling    Lips, mouth, and hand swelling     History of Present Illness    Garrett Hanson is a PMH of persistent atrial fibrillation, essential hypertension, OSA on CPAP, DJD left shoulder, CKD stage III, HLD, status post left hip replacement.  He is status post ablation 12/22/2019 by Dr. Rayann Heman.  Echocardiogram  11/30/2019 showed an LVEF of 60-65%, mild concentric left ventricular hypertrophy, moderately dilated left atria, mildly dilated right atrium, trivial mitral valve regurgitation and no other significant valvular abnormalities.  He was seen in follow-up by Dr. Rayann Heman on 03/03/2021.  During that time he continues to do well from a cardiac standpoint.  He denied palpitations, chest pain, shortness of breath and lower extremity edema.  He presents to the clinic today for follow up evaluation and states he continues to feel well.  He notices brief occasional episodes of heart palpitations.  He checks his palpitations with his Kardia mobile device.  We reviewed his previous cardiac ablation.  He is able to complete greater than 4 METS of physical activity.  We reviewed the importance of increasing physical activity and heart healthy diet.  He is not taking his metoprolol only 4 days/week and his blood pressure initially today is 140/90 and on recheck is 126/84.  We will continue his current medication regimen and plan follow-up in 12 months.  Today he denies chest pain, shortness of breath, lower extremity edema, fatigue, palpitations, melena, hematuria, hemoptysis, diaphoresis, weakness, presyncope, syncope, orthopnea, and PND.   Home Medications    Prior to Admission medications   Medication Sig Start Date End Date Taking? Authorizing Provider  albuterol (PROVENTIL HFA;VENTOLIN HFA) 108 (90 Base) MCG/ACT inhaler Inhale 1-2 puffs into the lungs as needed for wheezing or shortness of breath.  12/29/17   [provider]  atorvastatin (LIPITOR) 40 MG tablet Take 40 mg by mouth every evening.    [provider]  betamethasone dipropionate 0.05 % cream Apply topically. 01/15/20   [provider]  EPIPEN 2-PAK 0.3 MG/0.3ML SOAJ injection Inject 0.3 mg into the muscle as needed for anaphylaxis.  11/29/17   [provider]  famotidine (PEPCID) 40 MG tablet Take 40 mg by mouth 2 (two)  times daily.    [provider]  fluticasone (FLONASE) 50 MCG/ACT nasal spray Place 1 spray into both nostrils daily as needed for allergies. 11/29/17   [provider]  losartan (COZAAR) 100 MG tablet Take 100 mg by mouth daily.     [provider]  nystatin (MYCOSTATIN/NYSTOP) powder Apply 1 application topically 2 (two) times daily as needed (skin irritation.).  01/01/19   [provider]  Probiotic CAPS Take 1 capsule by mouth daily.    [provider]  spironolactone (ALDACTONE) 25 MG tablet TAKE 1 TABLET DAILY 04/11/21   Allred, Jeneen Rinks, MD  tadalafil (ADCIRCA/CIALIS) 20 MG tablet Take 20 mg by mouth daily as needed for erectile dysfunction.  11/29/17   [provider]    Family History    Family History  Problem Relation Age of Onset   Hypertension Mother    Hyperlipidemia Mother    Breast cancer Mother    Uterine cancer Mother    Sleep apnea Mother    Hypertension Brother    Hyperlipidemia Brother    Cancer Brother    He indicated that his mother is alive.  He indicated that his father is alive. He indicated that the status of his brother is unknown. He indicated that his maternal grandmother is deceased. He indicated that his maternal grandfather is deceased. He indicated that his paternal grandmother is deceased. He indicated that his paternal grandfather is deceased.  Social History    Social History   Socioeconomic History   Marital status: Married    Spouse name: Not on file   Number of children: Not on file   Years of education: Not on file   Highest education level: Not on file  Occupational History   Not on file  Tobacco Use   Smoking status: Never   Smokeless tobacco: Never  Vaping Use   Vaping Use: Never used  Substance and Sexual Activity   Alcohol use: Yes    Comment: 02/05/2017 "2-4 glasses of wine/month"   Drug use: No   Sexual activity: Yes  Other Topics Concern   Not on file  Social History Narrative    Pt lives in [place].  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   Social Determinants of Health   Financial Resource Strain: Not on file  Food Insecurity: Not on file  Transportation Needs: Not on file  Physical Activity: Not on file  Stress: Not on file  Social Connections: Not on file  Intimate Partner Violence: Not on file     Review of Systems    General:  No chills, fever, night sweats or weight changes.  Cardiovascular:  No chest pain, dyspnea on exertion, edema, orthopnea, palpitations, paroxysmal nocturnal dyspnea. Dermatological: No rash, lesions/masses Respiratory: No cough, dyspnea Urologic: No hematuria, dysuria Abdominal:   No nausea, vomiting, diarrhea, bright red blood per rectum, melena, or hematemesis Neurologic:  No visual changes, wkns, changes in mental status. All other systems reviewed and are otherwise negative except as noted above.  Physical Exam    VS:  BP 126/84   Pulse 74   Ht 6\' 1"  (1.854 m)   Wt 298 lb 3.2 oz (135.3 kg)   SpO2 98%   BMI 39.34 kg/m  , BMI Body mass index is 39.34 kg/m. GEN: Well nourished, well developed, in no acute distress. HEENT: normal. Neck: Supple, no JVD, carotid bruits, or masses. Cardiac: RRR, no murmurs, rubs, or gallops. No clubbing, cyanosis, edema.  Radials/DP/PT 2+ and equal bilaterally.  Respiratory:  Respirations regular and unlabored, clear to auscultation bilaterally. GI: Soft, nontender, nondistended, BS + x 4. MS: no deformity or atrophy. Skin: warm and dry, no rash. Neuro:  Strength and sensation are intact. Psych: Normal affect.  Accessory Clinical Findings    Recent Labs: No results found for requested labs within last 365 days.   Recent Lipid Panel No results found for: "CHOL", "TRIG", "HDL", "CHOLHDL", "VLDL", "LDLCALC", "LDLDIRECT"       ECG personally reviewed by me today-normal sinus rhythm no ectopy 74 bpm- No acute changes  Echocardiogram  11/30/2019  IMPRESSIONS     1. Left ventricular ejection fraction, by estimation, is 60 to 65%. The  left ventricle has normal function. The left ventricle has no regional  wall motion abnormalities. There is mild concentric left ventricular  hypertrophy. Left ventricular diastolic  function could not be evaluated.   2. Right ventricular systolic function is normal. The right ventricular  size is normal. There is normal pulmonary artery systolic pressure.   3. Left atrial size was moderately dilated.   4. Right atrial size was mildly dilated.  5. The mitral valve is normal in structure. Trivial mitral valve  regurgitation. No evidence of mitral stenosis.   6. Unable to determine valve morphology due to image quality. Aortic  valve regurgitation is not visualized. No aortic stenosis is present.   7. Aortic dilatation noted. There is borderline dilatation of the  ascending aorta measuring 38 mm.   8. The inferior vena cava is normal in size with greater than 50%  respiratory variability, suggesting right atrial pressure of 3 mmHg.   Comparison(s): No significant change from prior study.   FINDINGS   Left Ventricle: Left ventricular ejection fraction, by estimation, is 60  to 65%. The left ventricle has normal function. The left ventricle has no  regional wall motion abnormalities. The left ventricular internal cavity  size was normal in size. There is   mild concentric left ventricular hypertrophy. Left ventricular diastolic  function could not be evaluated due to atrial fibrillation. Left  ventricular diastolic function could not be evaluated.   Right Ventricle: The right ventricular size is normal. No increase in  right ventricular wall thickness. Right ventricular systolic function is  normal. There is normal pulmonary artery systolic pressure. The tricuspid  regurgitant velocity is 2.03 m/s, and   with an assumed right atrial pressure of 3 mmHg, the estimated right   ventricular systolic pressure is 67.2 mmHg.   Left Atrium: Left atrial size was moderately dilated.   Right Atrium: Right atrial size was mildly dilated.   Pericardium: There is no evidence of pericardial effusion.   Mitral Valve: The mitral valve is normal in structure. Trivial mitral  valve regurgitation. No evidence of mitral valve stenosis.   Tricuspid Valve: The tricuspid valve is normal in structure. Tricuspid  valve regurgitation is mild . No evidence of tricuspid stenosis.   Aortic Valve: Unable to determine valve morphology due to image quality.  Aortic valve regurgitation is not visualized. No aortic stenosis is  present.   Pulmonic Valve: The pulmonic valve was not well visualized. Pulmonic valve  regurgitation is not visualized.   Aorta: Aortic dilatation noted. There is borderline dilatation of the  ascending aorta measuring 38 mm.   Venous: The inferior vena cava is normal in size with greater than 50%  respiratory variability, suggesting right atrial pressure of 3 mmHg.   IAS/Shunts: The atrial septum is grossly normal.    Assessment & Plan   1.  Atrial fibrillation-EKG today shows normal sinus rhythm no ectopy.  Status post ablation.  CHA2DS2-VASc score 1.  Anticoagulation stopped by Dr. Rayann Heman. Continue to monitor Heart healthy low-sodium diet-salty 6 given Increase physical activity as tolerated   Essential hypertension-BP today 126/84 Avoid secondary causes of hypertension-reviewed Heart healthy low-sodium diet-salty 6 given Increase physical activity as tolerated Maintain blood pressure log  OSA-compliant with CPAP.  Waking up well rested. Continue CPAP use Continue weight loss/maintain weight  Preoperative cardiac evaluation-colonoscopy, Dr. Jules Husbands, [facility]    Primary Cardiologist: Sanda Klein, MD  Chart reviewed as part of pre-operative protocol coverage. Given past medical history and time since last visit, based on  ACC/AHA guidelines, Abubakr Wieman Gora would be at acceptable risk for the planned procedure without further cardiovascular testing.   Patient was advised that if he/ develops new symptoms prior to surgery to contact our office to arrange a follow-up appointment.  He verbalized understanding.  I will route this recommendation to the requesting party via Epic fax function and remove from pre-op pool.    Disposition: Follow-up  with Dr. Sallyanne Kuster or me in 1 year.   Jossie Ng. Madicyn Mesina NP-C     05/24/2022, 2:24 PM [facility] [address] Office [phone] Fax [phone]    I spent 14 minutes examining this patient, reviewing medications, and using patient centered shared decision making involving her cardiac care.  Prior to her visit I spent greater than 20 minutes reviewing her past medical history,  medications, and prior cardiac tests.

## 2022-05-24 ENCOUNTER — Ambulatory Visit: Payer: 59 | Attending: General Practice | Admitting: General Practice

## 2022-05-24 ENCOUNTER — Encounter: Payer: Self-pay | Admitting: General Practice

## 2022-05-24 VITALS — BP 126/84 | HR 74 | Ht 73.0 in | Wt 298.2 lb

## 2022-05-24 DIAGNOSIS — G4733 Obstructive sleep apnea (adult) (pediatric): Secondary | ICD-10-CM

## 2022-05-24 DIAGNOSIS — I10 Essential (primary) hypertension: Secondary | ICD-10-CM | POA: Diagnosis not present

## 2022-05-24 DIAGNOSIS — I4819 Other persistent atrial fibrillation: Secondary | ICD-10-CM

## 2022-05-24 DIAGNOSIS — Z01810 Encounter for preprocedural cardiovascular examination: Secondary | ICD-10-CM | POA: Diagnosis not present

## 2022-05-24 NOTE — Patient Instructions (Signed)
Medication Instructions:  MAY TAKE EXTRA 1/2 METOPROLOL 12.5 MG FOR SUSTAINED PALPITATIONS *If you need a refill on your cardiac medications before your next appointment, please call your pharmacy*  Lab Work: NONE If you have labs (blood work) drawn today and your tests are completely normal, you will receive your results only by: [place] (if you have MyChart) OR A paper copy in the mail If you have any lab test that is abnormal or we need to change your treatment, we will call you to review the results.  Testing/Procedures: NONE  Follow-Up: At [facility], you and your health needs are our priority.  As part of our continuing mission to provide you with exceptional heart care, we have created designated Provider Care Teams.  These Care Teams include your primary Cardiologist (physician) and Advanced Practice Providers (APPs -  Physician Assistants and Nurse Practitioners) who all work together to provide you with the care you need, when you need it.  Your next appointment:   12 month(s)  Provider:   Sanda Klein, MD     Other Instructions INCREASE PHYSICAL ACTIVITY AS TOLERATED  LOW RISK FOR COLONOSCOPY

## 2022-05-31 ENCOUNTER — Encounter (HOSPITAL_COMMUNITY): Payer: Self-pay | Admitting: *Deleted

## 2023-01-10 ENCOUNTER — Encounter: Payer: Self-pay | Admitting: Orthopedic Surgery

## 2023-01-10 ENCOUNTER — Other Ambulatory Visit: Payer: Self-pay | Admitting: Orthopedic Surgery

## 2023-01-10 DIAGNOSIS — M5412 Radiculopathy, cervical region: Secondary | ICD-10-CM

## 2023-01-15 NOTE — Discharge Instructions (Signed)

## 2023-01-16 ENCOUNTER — Ambulatory Visit
Admission: RE | Admit: 2023-01-16 | Discharge: 2023-01-16 | Disposition: A | Discharge status: Home / Self Care | Payer: Self-pay | Source: Ambulatory Visit | Attending: Orthopedic Surgery | Admitting: Orthopedic Surgery

## 2023-01-16 DIAGNOSIS — M5412 Radiculopathy, cervical region: Secondary | ICD-10-CM

## 2023-01-16 MED ORDER — TRIAMCINOLONE ACETONIDE 40 MG/ML IJ SUSP (RADIOLOGY)
60.0000 mg | Freq: Once | INTRAMUSCULAR | Status: AC
  Administered 2023-01-16: 60 mg via EPIDURAL

## 2023-01-16 MED ORDER — IOPAMIDOL (ISOVUE-M 300) INJECTION 61%
1.0000 mL | Freq: Once | INTRAMUSCULAR | Status: AC | PRN
  Administered 2023-01-16: 1 mL via EPIDURAL

## 2023-09-04 ENCOUNTER — Telehealth: Payer: Self-pay | Admitting: General Practice

## 2023-09-04 NOTE — Telephone Encounter (Signed)
 Attempted to call pt, unable to reach pt. LMTCB.

## 2023-09-04 NOTE — Telephone Encounter (Signed)
 Patient c/o Palpitations: STAT if patient c/o lightheadedness, shortness of breath, or chest pain  How long have you had palpitations/irregular HR/ Afib? Are you having the symptoms now?  Has been in afib for the past 3 days. Still currently in afib.  Are you currently experiencing lightheadedness, SOB or CP?  No symptoms currently.  Do you have a history of afib (atrial fibrillation) or irregular heart rhythm?  Yes + saw Dr. Nunzio Belch, [facility] and had ablation in the past  Have you checked your BP or HR? (document readings if available):  HR is fluctuating 95-150, in the 90's most recently  BP is low. Patient says it's around 110/95, 110/80, 100/73 (not exact readings)   Are you experiencing any other symptoms?  Lightheadedness, fatigue

## 2023-09-05 NOTE — Telephone Encounter (Signed)
 Pt returning call, requesting cb

## 2023-09-05 NOTE — Telephone Encounter (Signed)
 Pt called to report that he has been in Afib for 3 days and having some lightheadedness and fatigue... this has been the first time since his Ablation 4 years ago with Dr Nunzio Belch.   He has been taking his metoprolol  and his HR is now staying between 70-90 and BP 130/70.   His last OV was 05/2022....Aaron Aas I made him an appt with the DOD.. Dr Berry Bristol for tomorrow and if anything changes or worsens he will go to the ED.

## 2023-09-06 ENCOUNTER — Ambulatory Visit: Attending: Cardiology | Admitting: Cardiology

## 2023-09-06 ENCOUNTER — Encounter: Payer: Self-pay | Admitting: Cardiology

## 2023-09-06 VITALS — BP 118/82 | HR 104 | Ht 73.5 in | Wt 284.0 lb

## 2023-09-06 DIAGNOSIS — G4733 Obstructive sleep apnea (adult) (pediatric): Secondary | ICD-10-CM | POA: Diagnosis not present

## 2023-09-06 DIAGNOSIS — E7800 Pure hypercholesterolemia, unspecified: Secondary | ICD-10-CM | POA: Diagnosis not present

## 2023-09-06 DIAGNOSIS — I10 Essential (primary) hypertension: Secondary | ICD-10-CM

## 2023-09-06 DIAGNOSIS — I4819 Other persistent atrial fibrillation: Secondary | ICD-10-CM | POA: Diagnosis not present

## 2023-09-06 MED ORDER — APIXABAN 5 MG PO TABS: 5.0000 mg | ORAL_TABLET | Freq: Two times a day (BID) | ORAL | 0 refills | Status: DC

## 2023-09-06 MED ORDER — SPIRONOLACTONE 25 MG PO TABS: 25.0000 mg | ORAL_TABLET | Freq: Every day | ORAL | 0 refills | Status: AC

## 2023-09-06 MED ORDER — FLECAINIDE ACETATE 100 MG PO TABS: 100.0000 mg | ORAL_TABLET | Freq: Two times a day (BID) | ORAL | 0 refills | Status: DC

## 2023-09-06 MED ORDER — METOPROLOL TARTRATE 25 MG PO TABS: 25.0000 mg | ORAL_TABLET | Freq: Two times a day (BID) | ORAL | 0 refills | Status: DC

## 2023-09-06 MED ORDER — ATORVASTATIN CALCIUM 40 MG PO TABS
40.0000 mg | ORAL_TABLET | Freq: Every evening | ORAL | 0 refills | Status: DC
Start: 2023-09-06 — End: 2023-12-09

## 2023-09-06 NOTE — Progress Notes (Signed)
 Cardiology Office Note:  .   Date:  09/06/2023  ID:  Garrett Hanson, DOB [birth year], MRN [id number] PCP: Jimmey Mould, MD  [facility] Providers Cardiologist:  Luana Rumple, MD   History of Present Illness: Garrett Hanson   Garrett Hanson is a 63 y.o. persistent atrial fibrillation, essential hypertension, OSA on CPAP, DJD left shoulder, CKD stage III, HLD, status post left hip replacement. He is status post ablation 12/22/2019 by Dr. Nunzio Belch. Echocardiogram 11/30/2019 showed an LVEF of 60-65%, mild concentric left ventricular hypertrophy, moderately dilated left atria, mildly dilated right atrium, trivial mitral valve regurgitation and no other significant valvular abnormalities.  He had been maintaining sinus rhythm, noticed palpitations that occurred on 09/01/2023 and felt that he was back in A-fib and presented to be evaluated further.   Discussed the use of AI scribe software for clinical note transcription with the patient, who gave verbal consent to proceed.  History of Present Illness Garrett Hanson is a 63 year old male with atrial fibrillation who presents with recurrence of atrial fibrillation symptoms.  He experienced a recurrence of atrial fibrillation symptoms on Sep 01, 2023, (last weekend) with sensations of his heart 'going nuts' and likened to a panic attack. Symptoms persisted until Sep 03, 2023, with heart rate fluctuations between the high 90s and 140s. A brief period of normal rhythm occurred after physical activity but symptoms returned later that day.  He uses a CPAP machine nightly and began taking metoprolol  25 mg and aspirin each night upon symptom recurrence. He has leftover Eliquis  but has not resumed it. He has undergone multiple cardioversions in the past. He started Zepbound for weight loss in October 2024, losing 25 pounds, but remains over 300 pounds. Occasional brief episodes of heart palpitations have occurred over the past year, but this is the first  significant episode since the ablation.  He experiences fatigue and lightheadedness, especially when climbing stairs, and feels 'a little bit winded.' His physical activity includes occasional four-mile bike rides on an electric assist bike and infrequent walks. He has a history of using metoprolol , previously at 25 mg, then reduced to 12.5 mg, and currently resumed at 25 mg once daily. He also takes atorvastatin  and spironolactone , which are due for refill. He does not smoke and had a normal cardiac stress test in 2020.  Labs   External Labs:  PCP labs on Care Everywhere 11/16/2022:  Hb 14.0/HCT 46.0, platelets 218, normal indicis.  A1c 6.0%.  Serum glucose 82 mg, BUN 19, creatinine 1.11, EGFR 74 mL, potassium 4.5, sodium 140.  Labs 02/05/2023:  Total cholesterol 124, triglycerides 87, HDL 48, LDL 59.  42.6.  ROS  Review of Systems  Constitutional: Positive for malaise/fatigue.  Cardiovascular:  Positive for dyspnea on exertion and palpitations. Negative for chest pain and leg swelling.    Physical Exam:   VS:  BP 118/82 (BP Location: Left Arm, Patient Position: Sitting, Cuff Size: Large)   Pulse (!) 104   Ht 6' 1.5" (1.867 m)   Wt 284 lb (128.8 kg)   SpO2 99%   BMI 36.96 kg/m    Wt Readings from Last 3 Encounters:  09/06/23 284 lb (128.8 kg)  05/24/22 298 lb 3.2 oz (135.3 kg)  03/03/21 (!) 300 lb 12.8 oz (136.4 kg)    Physical Exam Neck:     Vascular: No carotid bruit or JVD.  Cardiovascular:     Rate and Rhythm: Tachycardia present. Rhythm irregularly irregular.     Pulses:  Normal pulses and intact distal pulses.     Heart sounds: No murmur heard. Pulmonary:     Effort: Pulmonary effort is normal.     Breath sounds: Normal breath sounds.  Abdominal:     General: Bowel sounds are normal.     Palpations: Abdomen is soft.  Musculoskeletal:     Right lower leg: No edema.     Left lower leg: No edema.  Skin:    Capillary Refill: Capillary refill takes less than 2  seconds.    Studies Reviewed: .    MYOCARDIAL PERFUSION IMAGING 02/26/2019   Narrative  Nuclear stress EF: 59%. The left ventricular ejection fraction is normal (55-65%).  There was no ST segment deviation noted during stress.  This is a low risk study.  The study is normal.  ECHOCARDIOGRAM COMPLETE 11/30/2019  1. Left ventricular ejection fraction, by estimation, is 60 to 65%. The left ventricle has normal function. The left ventricle has no regional wall motion abnormalities. There is mild concentric left ventricular hypertrophy. Left ventricular diastolic function could not be evaluated. 2. Right ventricular systolic function is normal. The right ventricular size is normal. There is normal pulmonary artery systolic pressure. 3. Left atrial size was moderately dilated. 4. Right atrial size was mildly dilated. 5.  No significant valvular abnormality. EKG:    EKG Interpretation Date/Time:  Friday Sep 06 2023 11:49:45 EDT Ventricular Rate:  104 PR Interval:    QRS Duration:  100 QT Interval:  354 QTC Calculation: 465 R Axis:   38  Text Interpretation: EKG 09/06/2023: Atrial fibrillation with rapid ventricular sponsor at the rate of 104 bpm, normal axis.  Poor R progression, cannot exclude anteroseptal infarct old.  Compared to 05/24/2022, sinus rhythm has been replaced. Compared to 01/19/2020, sinus rhythm has been replaced. Reconfirmed by Philis Doke, Jagadeesh ([id number]) on 09/06/2023 12:45:05 PM    Medications and allergies    Allergies  Allergen Reactions   Penicillins Anaphylaxis    Has patient had a PCN reaction causing immediate rash, facial/tongue/throat swelling, SOB or lightheadedness with hypotension: Yes Has patient had a PCN reaction causing severe rash involving mucus membranes or skin necrosis: No Has patient had a PCN reaction that required hospitalization:Yes Has patient had a PCN reaction occurring within the last 10 years: No If all of the above answers are "NO", then  may proceed with Cephalosporin use.    Singulair [Montelukast Sodium] Swelling    Lips, mouth, and hand swelling      Current Outpatient Medications:    apixaban  (ELIQUIS ) 5 MG TABS tablet, Take 1 tablet (5 mg total) by mouth 2 (two) times daily., Disp: 180 tablet, Rfl: 0   Cholecalciferol (VITAMIN D3) 25 MCG (1000 UT) CAPS, Take by mouth., Disp: , Rfl:    famotidine  (PEPCID ) 40 MG tablet, Take 40 mg by mouth 2 (two) times daily., Disp: , Rfl:    flecainide (TAMBOCOR) 100 MG tablet, Take 1 tablet (100 mg total) by mouth 2 (two) times daily., Disp: 180 tablet, Rfl: 0   losartan  (COZAAR ) 100 MG tablet, Take 100 mg by mouth daily. , Disp: , Rfl:    metoprolol  tartrate (LOPRESSOR ) 25 MG tablet, Take 1 tablet (25 mg total) by mouth 2 (two) times daily., Disp: 180 tablet, Rfl: 0   atorvastatin  (LIPITOR) 40 MG tablet, Take 1 tablet (40 mg total) by mouth every evening., Disp: 90 tablet, Rfl: 0   spironolactone  (ALDACTONE ) 25 MG tablet, Take 1 tablet (25 mg total) by mouth daily., Disp:  90 tablet, Rfl: 0   tirzepatide (ZEPBOUND) 7.5 MG/0.5ML Pen, 0.5 mL Subcutaneous once a week for 30 days E66.01 dose change Please fill as a VIAL, Disp: , Rfl:    Meds ordered this encounter  Medications   atorvastatin  (LIPITOR) 40 MG tablet    Sig: Take 1 tablet (40 mg total) by mouth every evening.    Dispense:  90 tablet    Refill:  0    Refills to Dr. Cherly Corners   spironolactone  (ALDACTONE ) 25 MG tablet    Sig: Take 1 tablet (25 mg total) by mouth daily.    Dispense:  90 tablet    Refill:  0    Refills to Dr. Cherly Corners   apixaban  (ELIQUIS ) 5 MG TABS tablet    Sig: Take 1 tablet (5 mg total) by mouth 2 (two) times daily.    Dispense:  180 tablet    Refill:  0   metoprolol  tartrate (LOPRESSOR ) 25 MG tablet    Sig: Take 1 tablet (25 mg total) by mouth 2 (two) times daily.    Dispense:  180 tablet    Refill:  0    Dose change (increased 09/06/23)   flecainide (TAMBOCOR) 100 MG tablet    Sig: Take 1  tablet (100 mg total) by mouth 2 (two) times daily.    Dispense:  180 tablet    Refill:  0     Medications Discontinued During This Encounter  Medication Reason   albuterol  (PROVENTIL  HFA;VENTOLIN  HFA) 108 (90 Base) MCG/ACT inhaler Patient Preference   betamethasone  dipropionate 0.05 % cream Patient Preference   EPIPEN  2-PAK 0.3 MG/0.3ML SOAJ injection Patient Preference   fluticasone  (FLONASE ) 50 MCG/ACT nasal spray Patient Preference   nystatin (MYCOSTATIN/NYSTOP) powder Patient Preference   Probiotic CAPS Patient Preference   tadalafil (ADCIRCA/CIALIS) 20 MG tablet Patient Preference   atorvastatin  (LIPITOR) 40 MG tablet Reorder   spironolactone  (ALDACTONE ) 25 MG tablet Reorder   metoprolol  tartrate (LOPRESSOR ) 25 MG tablet Dose change    ASSESSMENT AND PLAN: .      ICD-10-CM   1. Persistent atrial fibrillation (HCC)  I48.19 EKG 12-Lead    ECHOCARDIOGRAM COMPLETE    2. Essential hypertension  I10 spironolactone  (ALDACTONE ) 25 MG tablet    ECHOCARDIOGRAM COMPLETE    3. Obstructive sleep apnea  G47.33     4. Hypercholesteremia  E78.00 atorvastatin  (LIPITOR) 40 MG tablet     Click Here to Calculate/Change CHADS2VASc Score The patient's CHADS2-VASc score is 1, indicating a 0.6% annual risk of stroke.  Therefore, anticoagulation is recommended in view of possible need for cardioversion.   CHF History: No HTN History: Yes Diabetes History: No Stroke History: No Vascular Disease History: No   Assessment & Plan Persistent atrial fibrillation   He has experienced persistent atrial fibrillation for the last three days starting 09/01/2023, accompanied by palpitations, fatigue, and lightheadedness and dyspnea. After an ablation in 2021, he had no episodes until Sep 01, 2023.  He is currently on metoprolol  25 mg once daily and aspirin.  Will increase it to twice daily dosing. He prefers to avoid long-term medication and is considering a second ablation or medication management. We  reviewed AFib risks, including thromboembolism, and the importance of anticoagulation.  He will start with Eliquis  5 mg p.o. twice daily and after 2 weeks will initiate flecainide. Flecainide was discussed as a pill-in-the-pocket approach if he returns to sinus rhythm.  If he still persist to been atrial fibrillation after 2 weeks of  being on anticoagulation, advised him to start flecainide on a daily basis until seen by Dr. Alvis Ba.   He is concerned about long-term medication use due to age, but is willing to try flecainide for short-term or in a pill in the pocket approach. Start Eliquis  5 mg twice daily for anticoagulation. Increase metoprolol  to 25 mg twice daily. Prescribe flecainide 100 mg twice daily, to start in two weeks. Order an echocardiogram.  Consider referral to an electrophysiologist to discuss Tikosyn  or a second ablation. Follow up with an EKG in 10-12 days after starting flecainide. Discuss the pill-in-the-pocket approach with flecainide if he returns to sinus rhythm.  He will follow-up with Dr. Alvis Ba in  4 weeks.  Hypertension   His hypertension is managed with metoprolol  and spironolactone . The current blood pressure management includes metoprolol , which is being adjusted for AFib management. Refill spironolactone  prescription.  Obstructive sleep apnea   He uses CPAP nightly without issues, compliance again discussed..  Hypercholesterolemia   His hypercholesterolemia is managed by his primary care physician with atorvastatin . He requested a refill of atorvastatin . Refill atorvastatin  10 mg prescription.  This was a 40-minute office visit encounter and discussions regarding medication management of atrial fibrillation versus ablation, discussion regarding anticoagulation, review of his medical records as he is new to me.  All questions were answered.  Follow-up arrangements made.  Signed,  Knox Perl, MD, [facility] 09/06/2023, 9:33 PM [facility] [address] [place], [place] [address] Phone: [phone]. Fax:  [phone]

## 2023-09-06 NOTE — Patient Instructions (Addendum)
 Medication Instructions:  Your physician has recommended you make the following change in your medication:   1) START apixaban  (Eliquis ) 5 mg twice daily 2) CHANGE metoprolol  tartrate (Lopressor ) to 25 mg twice daily 3) In 15 days (Sep 21, 2023) start flecainide 100 mg twice daily.  *If you need a refill on your cardiac medications before your next appointment, please call your pharmacy*  Follow-Up: At [facility], you and your health needs are our priority.  As part of our continuing mission to provide you with exceptional heart care, our providers are all part of one team.  This team includes your primary Cardiologist (physician) and Advanced Practice Providers or APPs (Physician Assistants and Nurse Practitioners) who all work together to provide you with the care you need, when you need it.  Your next appointment:   1 month(s)  The format for your next appointment:   In Person  Provider:   Luana Rumple, MD{  We recommend signing up for the patient portal called "MyChart".  Sign up information is provided on this After Visit Summary.  MyChart is used to connect with patients for Virtual Visits (Telemedicine).  Patients are able to view lab/test results, encounter notes, upcoming appointments, etc.  Non-urgent messages can be sent to your provider as well.   To learn more about what you can do with MyChart, go to [email or web address].

## 2023-09-09 ENCOUNTER — Telehealth: Payer: Self-pay | Admitting: Cardiovascular Disease

## 2023-09-09 ENCOUNTER — Encounter: Payer: Self-pay | Admitting: Cardiovascular Disease

## 2023-09-09 NOTE — Telephone Encounter (Signed)
 Patient wants a call back directly from [employer].

## 2023-09-09 NOTE — Telephone Encounter (Signed)
 Patient identification verified by 2 forms.   Called and spoke to patient  Patient states:  -In rhythm for last 4 years -Dr. Berry Bristol says I need to get back in with Dr. Tita Form -Started new medications last Thursday (5/15).  -Pt needs to be seen after being on the medication for 3 weeks before seeing Dr. Tita Form.   Interventions/Plan: -Get appt with Dr. Tita Form.   Reviewed ED warning signs/precautions  Patient agrees with plan, no questions at this time

## 2023-09-10 NOTE — Telephone Encounter (Signed)
 Please add on June 03, 10:00 AM

## 2023-09-12 ENCOUNTER — Ambulatory Visit (HOSPITAL_COMMUNITY)
Admission: RE | Admit: 2023-09-12 | Discharge: 2023-09-12 | Disposition: A | Discharge status: Home / Self Care | Source: Ambulatory Visit | Attending: Cardiovascular Disease | Admitting: Cardiovascular Disease

## 2023-09-12 ENCOUNTER — Ambulatory Visit: Payer: Self-pay | Admitting: Cardiology

## 2023-09-12 DIAGNOSIS — I4819 Other persistent atrial fibrillation: Secondary | ICD-10-CM | POA: Insufficient documentation

## 2023-09-12 DIAGNOSIS — I10 Essential (primary) hypertension: Secondary | ICD-10-CM | POA: Insufficient documentation

## 2023-09-12 LAB — ECHOCARDIOGRAM COMPLETE: S' Lateral: 3.1 cm

## 2023-09-12 NOTE — Progress Notes (Signed)
 Essentially normal echo with minor abnormality

## 2023-09-12 NOTE — Telephone Encounter (Signed)
 Appt made

## 2023-09-24 ENCOUNTER — Emergency Department (HOSPITAL_COMMUNITY)
Admission: EM | Admit: 2023-09-24 | Discharge: 2023-09-24 | Disposition: A | Discharge status: Home / Self Care | Attending: Emergency Medicine | Admitting: Emergency Medicine

## 2023-09-24 ENCOUNTER — Encounter: Payer: Self-pay | Admitting: Cardiovascular Disease

## 2023-09-24 ENCOUNTER — Encounter (HOSPITAL_COMMUNITY): Payer: Self-pay

## 2023-09-24 ENCOUNTER — Ambulatory Visit: Attending: Cardiovascular Disease | Admitting: Cardiovascular Disease

## 2023-09-24 ENCOUNTER — Telehealth: Payer: Self-pay | Admitting: Emergency Medicine

## 2023-09-24 ENCOUNTER — Other Ambulatory Visit: Payer: Self-pay

## 2023-09-24 ENCOUNTER — Ambulatory Visit (HOSPITAL_COMMUNITY): Admission: EM | Admit: 2023-09-24 | Discharge: 2023-09-24 | Disposition: A | Discharge status: Home / Self Care

## 2023-09-24 VITALS — BP 114/72 | HR 120 | Ht 73.5 in | Wt 281.2 lb

## 2023-09-24 DIAGNOSIS — S6992XA Unspecified injury of left wrist, hand and finger(s), initial encounter: Secondary | ICD-10-CM | POA: Diagnosis present

## 2023-09-24 DIAGNOSIS — I10 Essential (primary) hypertension: Secondary | ICD-10-CM | POA: Diagnosis not present

## 2023-09-24 DIAGNOSIS — D6869 Other thrombophilia: Secondary | ICD-10-CM | POA: Diagnosis not present

## 2023-09-24 DIAGNOSIS — I483 Typical atrial flutter: Secondary | ICD-10-CM | POA: Diagnosis not present

## 2023-09-24 DIAGNOSIS — Z7901 Long term (current) use of anticoagulants: Secondary | ICD-10-CM | POA: Diagnosis not present

## 2023-09-24 DIAGNOSIS — S61313A Laceration without foreign body of left middle finger with damage to nail, initial encounter: Secondary | ICD-10-CM

## 2023-09-24 DIAGNOSIS — Z6836 Body mass index (BMI) 36.0-36.9, adult: Secondary | ICD-10-CM

## 2023-09-24 DIAGNOSIS — S61319A Laceration without foreign body of unspecified finger with damage to nail, initial encounter: Secondary | ICD-10-CM

## 2023-09-24 DIAGNOSIS — G4733 Obstructive sleep apnea (adult) (pediatric): Secondary | ICD-10-CM

## 2023-09-24 DIAGNOSIS — I4819 Other persistent atrial fibrillation: Secondary | ICD-10-CM | POA: Diagnosis not present

## 2023-09-24 DIAGNOSIS — W269XXA Contact with unspecified sharp object(s), initial encounter: Secondary | ICD-10-CM | POA: Insufficient documentation

## 2023-09-24 DIAGNOSIS — E66812 Obesity, class 2: Secondary | ICD-10-CM

## 2023-09-24 DIAGNOSIS — S61213A Laceration without foreign body of left middle finger without damage to nail, initial encounter: Secondary | ICD-10-CM | POA: Insufficient documentation

## 2023-09-24 DIAGNOSIS — Z79899 Other long term (current) drug therapy: Secondary | ICD-10-CM | POA: Diagnosis not present

## 2023-09-24 DIAGNOSIS — Z01812 Encounter for preprocedural laboratory examination: Secondary | ICD-10-CM

## 2023-09-24 MED ORDER — LOSARTAN POTASSIUM 50 MG PO TABS: 50.0000 mg | ORAL_TABLET | Freq: Every day | ORAL | 3 refills | Status: AC

## 2023-09-24 MED ORDER — METOPROLOL TARTRATE 50 MG PO TABS: 50.0000 mg | ORAL_TABLET | Freq: Two times a day (BID) | ORAL | 3 refills | Status: DC

## 2023-09-24 MED ORDER — LOSARTAN POTASSIUM 50 MG PO TABS: 50.0000 mg | ORAL_TABLET | Freq: Every day | ORAL | 3 refills | Status: DC

## 2023-09-24 MED ORDER — LIDOCAINE HCL 2 % IJ SOLN
15.0000 mL | Freq: Once | INTRAMUSCULAR | Status: DC
  Filled 2023-09-24: qty 20

## 2023-09-24 NOTE — ED Provider Notes (Signed)
 [facility]    CSN: [id number] Arrival date & time: 09/24/23  1102      History   Chief Complaint Chief Complaint  Patient presents with   Laceration    HPI Garrett Hanson is a 63 y.o. male.   HPI  In today for evaluation of his finger.  He endorses that on last night around 9 pm he was cutting a watermelon and sliced down into his finger.  He cut his nailbed.  He endorses that approximately 2 years ago he suffered the same type of injury. He endorsed that his nailbed did not heal correctly from this injury.  He did have that repaired.  He is on Eliquis  5 mg twice daily.  He endorses that there was a lot of bleeding at that time he applied a compressive dressing.  He is having pain.  He denies any fever, chills, dizziness. He is concern if he has come to the right place.   Past Medical History:  Diagnosis Date   Arthritis    "left knee; probably in the shoulders; left hip" (02/05/2017)   Chronic bronchitis (HCC)    "get it ~ q other year" (02/05/2017)   Chronic urticaria    CKD (chronic kidney disease), stage II    pt denies this hx on 02/05/2017   Complication of anesthesia    pt states awaken during colonscopy    Dysrhythmia    a fib   Essential hypertension    History of blood transfusion 1961   History of common duodenal ulcer 1980   History of kidney stones    Lymphadenopathy    a. evaluated by heme-onc 04/2016, under surveillance.   OSA on CPAP dx'd ~ 09/2016   Persistent atrial fibrillation (HCC)    Pneumonia    "X2" (02/05/2017)   Pure hypercholesterolemia     Patient Active Problem List   Diagnosis Date Noted   Degenerative joint disease of shoulder region 09/01/2019   Ingrown toenail 08/13/2019   Trochanteric bursitis of right hip 08/02/2019   Pain in joint of left shoulder 07/02/2019   Trochanteric bursitis of left hip 05/28/2018   S/P left THA, AA 06/04/2017   S/P hip replacement 06/04/2017   Class 2 severe obesity due to excess calories  with serious comorbidity and body mass index (BMI) of 37.0 to 37.9 in adult [facility]) 05/17/2017   Pain of left hip joint 05/09/2017   Pure hypercholesterolemia    Pneumonia    OSA on CPAP    Lymphadenopathy    History of kidney stones    Essential hypertension    Complication of anesthesia    CKD (chronic kidney disease), stage II    Chronic urticaria    Chronic bronchitis (HCC)    Arthritis    Persistent atrial fibrillation (HCC) 12/31/2016   CKD (chronic kidney disease), stage III (HCC) 12/31/2016   Obstructive sleep apnea 12/31/2016   Lymphadenopathy, abdominal 04/25/2016   S/P left TKA 02/07/2015   History of common duodenal ulcer 04/23/1978   History of blood transfusion 04/24/1959    Past Surgical History:  Procedure Laterality Date   ANTERIOR CERVICAL DECOMP/DISCECTOMY FUSION  04/2005   "used piece from my hip"   ATRIAL FIBRILLATION ABLATION N/A 12/22/2019   Procedure: ATRIAL FIBRILLATION ABLATION;  Surgeon: Jolly Needle, MD;  [facility];  Service: Cardiovascular;  Laterality: N/A;   CARDIOVERSION N/A 01/14/2017   Procedure: CARDIOVERSION;  Surgeon: Hugh Madura, MD;  Location: [facility];  Service: Cardiovascular;  Laterality: N/A;   CARDIOVERSION N/A 02/07/2017   Procedure: CARDIOVERSION;  Surgeon: Maudine Sos, MD;  Location: [facility] ENDOSCOPY;  Service: Cardiovascular;  Laterality: N/A;   CARDIOVERSION N/A 02/10/2018   Procedure: CARDIOVERSION;  Surgeon: Luana Rumple, MD;  Location: [facility] ENDOS[facility];  Service: Cardiovascular;  Laterality: N/A;   CARDIOVERSION N/A 02/02/2019   Procedure: CARDIOVERSION;  Surgeon: Liza Riggers, MD;  Location: [facility] ENDOSCOPY;  Service: Cardiovascular;  Laterality: N/A;   CARDIOVERSION N/A 08/26/2019   Procedure: CARDIOVERSION;  Surgeon: Alroy Aspen Lela Purple, MD;  Location: [facility] ENDOSCOPY;  Service: Cardiovascular;  Laterality: N/A;   COLONOSCOPY W/ BIOPSIES AND POLYPECTOMY  ~ 2012   "benign"   ELBOW SURGERY Right    "Tommy  John surgery"   EXTRACORPOREAL SHOCK WAVE LITHOTRIPSY  1990s   INGUINAL HERNIA REPAIR Right 1980   JOINT REPLACEMENT     left knee   KNEE ARTHROSCOPY Right ~ 1990   SHOULDER ARTHROSCOPY W/ ROTATOR CUFF REPAIR Right 2000s X 2   TONSILLECTOMY     "I think I did"   TOTAL HIP ARTHROPLASTY Left 06/04/2017   Procedure: LEFT TOTAL HIP ARTHROPLASTY ANTERIOR APPROACH;  Surgeon: Claiborne Crew, MD;  Location: [facility] ORS;  Service: Orthopedics;  Laterality: Left;   TOTAL KNEE ARTHROPLASTY Left 02/07/2015   Procedure: LEFT TOTAL KNEE ARTHROPLASTY;  Surgeon: Claiborne Crew, MD;  Location: [facility];  Service: Orthopedics;  Laterality: Left;       Home Medications    Prior to Admission medications   Medication Sig Start Date End Date Taking? Authorizing Provider  apixaban  (ELIQUIS ) 5 MG TABS tablet Take 1 tablet (5 mg total) by mouth 2 (two) times daily. 09/06/23   Knox Perl, MD  atorvastatin  (LIPITOR) 40 MG tablet Take 1 tablet (40 mg total) by mouth every evening. 09/06/23   Knox Perl, MD  Cholecalciferol (VITAMIN D3) 25 MCG (1000 UT) CAPS Take by mouth.    [provider]  famotidine  (PEPCID ) 40 MG tablet Take 40 mg by mouth daily.    [provider]  flecainide  (TAMBOCOR ) 100 MG tablet Take 1 tablet (100 mg total) by mouth 2 (two) times daily. 09/06/23   Knox Perl, MD  losartan  (COZAAR ) 50 MG tablet Take 1 tablet (50 mg total) by mouth daily. 09/24/23   Croitoru, Mihai, MD  metoprolol  tartrate (LOPRESSOR ) 50 MG tablet Take 1 tablet (50 mg total) by mouth 2 (two) times daily. 09/24/23   Croitoru, Mihai, MD  spironolactone  (ALDACTONE ) 25 MG tablet Take 1 tablet (25 mg total) by mouth daily. 09/06/23   Knox Perl, MD  tirzepatide (ZEPBOUND) 7.5 MG/0.5ML Pen 0.5 mL Subcutaneous once a week for 30 days E66.01 dose change Please fill as a VIAL    [provider]    Family History Family History  Problem Relation Age of Onset   Hypertension Mother    Hyperlipidemia Mother    Breast  cancer Mother    Uterine cancer Mother    Sleep apnea Mother    Hypertension Brother    Hyperlipidemia Brother    Cancer Brother     Social History Social History   Tobacco Use   Smoking status: Never   Smokeless tobacco: Never  Vaping Use   Vaping status: Never Used  Substance Use Topics   Alcohol use: Yes    Comment: 02/05/2017 "2-4 glasses of wine/month"   Drug use: No     Allergies   Penicillins, Pistachio nut (diagnostic), and Singulair [montelukast sodium]   Review  of Systems Review of Systems   Physical Exam Triage Vital Signs ED Triage Vitals  Encounter Vitals Group     BP 09/24/23 1154 111/84     Systolic BP Percentile --      Diastolic BP Percentile --      Pulse Rate 09/24/23 1154 (!) 114     Resp 09/24/23 1154 20     Temp 09/24/23 1154 98 F (36.7 C)     Temp Source 09/24/23 1154 Oral     SpO2 09/24/23 1154 95 %     Weight --      Height --      Head Circumference --      Peak Flow --      Pain Score 09/24/23 1155 7     Pain Loc --      Pain Education --      Exclude from Growth Chart --    No data found.  Updated Vital Signs BP 111/84 (BP Location: Left Arm)   Pulse (!) 114   Temp 98 F (36.7 C) (Oral)   Resp 20   SpO2 95%   Visual Acuity Right Eye Distance:   Left Eye Distance:   Bilateral Distance:    Right Eye Near:   Left Eye Near:    Bilateral Near:     Physical Exam Musculoskeletal:        General: Swelling and tenderness present.     Left hand: Deformity, tenderness and bony tenderness present.       Hands:     Comments: Nailbed laceration no active bleeding but frail       UC Treatments / Results  Labs (all labs ordered are listed, but only abnormal results are displayed) Labs Reviewed - No data to display  EKG   Radiology No results found.  Procedures Procedures (including critical care time)  Medications Ordered in UC Medications - No data to display  Initial Impression / Assessment and Plan /  UC Course  I have reviewed the triage vital signs and the nursing notes.  Pertinent labs & imaging results that were available during my care of the patient were reviewed by me and considered in my medical decision making (see chart for details).     laceration Final Clinical Impressions(s) / UC Diagnoses   Final diagnoses:  Nailbed laceration, finger, initial encounter     Discharge Instructions      You have been discharged to the [facility] emergency room for reevaluation of your left index finger nailbed laceration due to your use of Eliquis  and the potential to increase bleeding with removal of the nailbed   ED Prescriptions   None    PDMP not reviewed this encounter.   Eleanore Grey [place], NP 09/24/23 1329

## 2023-09-24 NOTE — ED Notes (Signed)
 Patient is being discharged from the Urgent Care and sent to the Emergency Department via POV . Per Eleanore Grey, NP, patient is in need of higher level of care due to laceration on blood thinners. Patient is aware and verbalizes understanding of plan of care.  Vitals:   09/24/23 1154  BP: 111/84  Pulse: (!) 114  Resp: 20  Temp: 98 F (36.7 C)  SpO2: 95%

## 2023-09-24 NOTE — Discharge Instructions (Signed)
 You have been discharged to the [facility] emergency room for reevaluation of your left index finger nailbed laceration due to your use of Eliquis  and the potential to increase bleeding with removal of the nailbed

## 2023-09-24 NOTE — Progress Notes (Signed)
 Cardiology Office Note   Date:  09/24/2023  ID:  Rondrick Barreira Roell, DOB [birth year], MRN [id number] PCP: Jimmey Mould, MD  [facility] Providers Cardiologist:  Luana Rumple, MD     History of Present Illness Garrett Hanson is a 63 y.o. male with history of persistent atrial fibrillation with successful ablation (pulmonary vein isolation, Dr. Nunzio Belch August 2021), severe obesity, OSA on CPAP, CKD stage III who presented with his first episode of symptomatic atrial fibrillation on 09/01/2023 ("heart going nuts, like a panic attack").  He saw Dr. Berry Bristol on 09/06/2023 and was started on Eliquis , flecainide  and metoprolol .  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  He does not have dizziness, palpitations, syncope or change in shortness of breath.  He occasionally gets "a little winded".  He has not had any spontaneous bleeding problems with Eliquis , but did cut his left middle finger while cutting a watermelon and has a tightly bandaged today.  He continues to use an electric assist bike and go occasionally for walks.  Prior to his ablation he failed dofetilide  therapy and required 5 or 6 different cardioversions (note that he required the high output defibrillator).  He does not have any known CAD and had a normal nuclear stress test in 2020, although he has never undergone cardiac catheterization.  He is taking Zepbound (he is paying for the prescription out of his own pocket) which usually self administers on Thursdays.  He has lost 26 pounds since starting this medication.    Studies Reviewed   EKG Interpretation Date/Time:  Tuesday September 24 2023 10:12:09 EDT Ventricular Rate:  120 PR Interval:    QRS Duration:  118 QT Interval:  362 QTC Calculation: 511 R Axis:   1  Text Interpretation: Atrial flutter with 2:1 A-V conduction Non-specific intra-ventricular conduction delay Nonspecific ST abnormality  When compared with ECG of 06-Sep-2023 11:49, Atrial flutter has replaced Atrial fibrillation Questionable change in QRS duration Confirmed by Annalena Piatt ([id number]) on 09/24/2023 10:36:22 AM         Risk Assessment/Calculations  CHA2DS2-VASc Score = 1   This indicates a 0.6% annual risk of stroke. The patient's score is based upon: CHF History: 0 HTN History: 1 Diabetes History: 0 Stroke History: 0 Vascular Disease History: 0 Age Score: 0 Gender Score: 0             Physical Exam VS:  BP 114/72 (BP Location: Right Arm, Patient Position: Sitting)   Pulse (!) 120   Ht 6' 1.5" (1.867 m)   Wt 127.6 kg   SpO2 94%   BMI 36.60 kg/m    Wt Readings from Last 3 Encounters:  09/24/23 127.6 kg  09/06/23 128.8 kg  05/24/22 135.3 kg    GEN: Obese, but also appears fit; well nourished, well developed in no acute distress NECK: No JVD; No carotid bruits CARDIAC: Rapid regular rhythm, no murmurs, rubs, gallops RESPIRATORY:  Clear to auscultation without rales, wheezing or rhonchi  ABDOMEN: Soft, non-tender, non-distended EXTREMITIES:  No edema; No deformity   ASSESSMENT AND PLAN AFib/flutter: After starting treatment with flecainide  he is now in organized typical counterclockwise right atrial flutter, with 2: 1 AV block.  Remains tachycardic with a rate of 120 bpm at rest.  Will increase his metoprolol  to 50 mg twice daily.  Continue Eliquis  without interruption in anticipation of DC cardioversion which we will schedule after 3 weeks of  anticoagulation on Tuesday, June 8. Note that he required the high energy defibrillator for successful DCCV in the past.  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  He does not want to take antiarrhythmic medications for atrial fibrillation long-term.  Will refer back to see one of our electrophysiologists to discuss cavotricuspid isthmus ablation and redo left atrial ablation with pulsed field  array. Anticoagulation: We reviewed the need for uninterrupted anticoagulation for 3 weeks before the cardioversion and 4 weeks afterwards.  Right now CHA2DS2-VASc score is only 1 for hypertension, but next year he turns 64 so we would be discussing indefinite anticoagulation.  If he wants to avoid that, he can discuss Watchman device implantation with EP as well. HTN: To avoid hypotension when increasing the dose of metoprolol  his losartan  to 50 mg daily. OSA: Reinforced the importance of 100% compliance with CPAP to reduce the likelihood of recurrent atrial fibrillation. Obesity: He has made a lot of progress with weight loss by using Zepbound.  Important to skip the Zepbound for 7 days before the planned cardioversion with sedation.    Informed Consent   Shared Decision Making/Informed Consent The risks (stroke, cardiac arrhythmias rarely resulting in the need for a temporary or permanent pacemaker, skin irritation or burns and complications associated with conscious sedation including aspiration, arrhythmia, respiratory failure and death), benefits (restoration of normal sinus rhythm) and alternatives of a direct current cardioversion were explained in detail to Garrett Hanson and he agrees to proceed.       Dispo:  Cardioversion scheduled with Dr. Alda Amas on June 09, 12:30 PM (arrive at [facility] at 11:30 AM) Continue flecainide  and Eliquis  without interruption Increase metoprolol  to 50 mg twice daily Decrease losartan  to 50 mg once daily Do not take Zepbound in the 7 days prior to cardioversion Follow-up with Dr. Arlester Ladd 10/03/2023  Signed, Luana Rumple, MD

## 2023-09-24 NOTE — Telephone Encounter (Signed)
 Called the patient and went over all of the information below: He verbalized understanding of all information    Dear Garrett Hanson  You are scheduled for a Cardioversion on Monday, June 9 with Dr. Alda Amas.  Please arrive at the [facility] (Main Entrance A) at [facility]: [address] [place], [place] [address] at 11:30 AM (This time is 1 hour(s) before your procedure to ensure your preparation).   Free valet parking service is available. You will check in at ADMITTING.   *Please Note: You will receive a call the day before your procedure to confirm the appointment time. That time may have changed from the original time based on the schedule for that day.*    DIET:  Nothing to eat or drink after midnight except a sip of water  with medications (see medication instructions below)  MEDICATION INSTRUCTIONS: !!IF ANY NEW MEDICATIONS ARE STARTED AFTER TODAY, PLEASE NOTIFY YOUR PROVIDER AS SOON AS POSSIBLE!!  FYI: Medications such as Semaglutide (Ozempic, [place]), Tirzepatide (Mounjaro, Zepbound), Dulaglutide (Trulicity), etc ("GLP1 agonists") AND Canagliflozin (Invokana), Dapagliflozin (Farxiga), Empagliflozin (Jardiance), Ertugliflozin (Steglatro), Bexagliflozin [employer]) or any combination with one of these drugs such as Invokamet (Canagliflozin/Metformin), Synjardy (Empagliflozin/Metformin), etc ("SGLT2 inhibitors") must be held around the time of a procedure. This is not a comprehensive list of all of these drugs. Please review all of your medications and talk to your provider if you take any one of these. If you are not sure, ask your provider.   HOLD: Tirzepatide (Mounjaro, Zepbound) for 7 days prior to the procedure. Last dose on Sunday, June 01. (Skip your dose for this week)   Continue taking your anticoagulant (blood thinner): Apixaban  (Eliquis ).  You will need to continue this after your procedure until you are told by your provider that it is safe to stop.    LABS:    Come to the lab at the [facility] ([address], [place], 1st Floor) between the hours of 8:00 am and 4:30 pm. You do NOT have to be fasting. Please have this done no later than Friday June 6th  FYI:  For your safety, and to allow us  to monitor your vital signs accurately during the surgery/procedure we request: If you have artificial nails, gel coating, SNS etc, please have those removed prior to your surgery/procedure. Not having the nail coverings /polish removed may result in cancellation or delay of your surgery/procedure.  Your support person will be asked to wait in the waiting room during your procedure.  It is OK to have someone drop you off and come back when you are ready to be discharged.  You cannot drive after the procedure and will need someone to drive you home.  Bring your insurance cards.  *Special Note: Every effort is made to have your procedure done on time. Occasionally there are emergencies that occur at the hospital that may cause delays. Please be patient if a delay does occur.

## 2023-09-24 NOTE — ED Triage Notes (Signed)
 Pt cut his left middle finger last night while cutting open watermelon. Pt dressed the wound with gauze. Pt states that he is on Eliquis  and is having trouble getting the bleeding to stop.

## 2023-09-24 NOTE — H&P (View-Only) (Signed)
 Cardiology Office Note   Date:  09/24/2023  ID:  Garrett Hanson, DOB [birth year], MRN [id number] PCP: Garrett Mould, MD  [facility] Providers Cardiologist:  Garrett Rumple, MD     History of Present Illness Garrett Hanson is a 63 y.o. male with history of persistent atrial fibrillation with successful ablation (pulmonary vein isolation, Dr. Nunzio Hanson August 2021), severe obesity, OSA on CPAP, CKD stage III who presented with his first episode of symptomatic atrial fibrillation on 09/01/2023 ("heart going nuts, like a panic attack").  He saw Garrett Hanson on 09/06/2023 and was started on Eliquis , flecainide  and metoprolol .  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  He does not have dizziness, palpitations, syncope or change in shortness of breath.  He occasionally gets "a little winded".  He has not had any spontaneous bleeding problems with Eliquis , but did cut his left middle finger while cutting a watermelon and has a tightly bandaged today.  He continues to use an electric assist bike and go occasionally for walks.  Prior to his ablation he failed dofetilide  therapy and required 5 or 6 different cardioversions (note that he required the high output defibrillator).  He does not have any known CAD and had a normal nuclear stress test in 2020, although he has never undergone cardiac catheterization.  He is taking Zepbound (he is paying for the prescription out of his own pocket) which usually self administers on Thursdays.  He has lost 26 pounds since starting this medication.    Studies Reviewed   EKG Interpretation Date/Time:  Tuesday September 24 2023 10:12:09 EDT Ventricular Rate:  120 PR Interval:    QRS Duration:  118 QT Interval:  362 QTC Calculation: 511 R Axis:   1  Text Interpretation: Atrial flutter with 2:1 A-V conduction Non-specific intra-ventricular conduction delay Nonspecific ST abnormality  When compared with ECG of 06-Sep-2023 11:49, Atrial flutter has replaced Atrial fibrillation Questionable change in QRS duration Confirmed by Annalena Hanson ([id number]) on 09/24/2023 10:36:22 AM         Risk Assessment/Calculations  CHA2DS2-VASc Score = 1   This indicates a 0.6% annual risk of stroke. The patient's score is based upon: CHF History: 0 HTN History: 1 Diabetes History: 0 Stroke History: 0 Vascular Disease History: 0 Age Score: 0 Gender Score: 0             Physical Exam VS:  BP 114/72 (BP Location: Right Arm, Patient Position: Sitting)   Pulse (!) 120   Ht 6' 1.5" (1.867 m)   Wt 127.6 kg   SpO2 94%   BMI 36.60 kg/m    Wt Readings from Last 3 Encounters:  09/24/23 127.6 kg  09/06/23 128.8 kg  05/24/22 135.3 kg    GEN: Obese, but also appears fit; well nourished, well developed in no acute distress NECK: No JVD; No carotid bruits CARDIAC: Rapid regular rhythm, no murmurs, rubs, gallops RESPIRATORY:  Clear to auscultation without rales, wheezing or rhonchi  ABDOMEN: Soft, non-tender, non-distended EXTREMITIES:  No edema; No deformity   ASSESSMENT AND PLAN AFib/flutter: After starting treatment with flecainide  he is now in organized typical counterclockwise right atrial flutter, with 2: 1 AV block.  Remains tachycardic with a rate of 120 bpm at rest.  Will increase his metoprolol  to 50 mg twice daily.  Continue Eliquis  without interruption in anticipation of DC cardioversion which we will schedule after 3 weeks of  anticoagulation on Tuesday, June 8. Note that he required the high energy defibrillator for successful DCCV in the past.  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  He does not want to take antiarrhythmic medications for atrial fibrillation long-term.  Will refer back to see one of our electrophysiologists to discuss cavotricuspid isthmus ablation and redo left atrial ablation with pulsed field  array. Anticoagulation: We reviewed the need for uninterrupted anticoagulation for 3 weeks before the cardioversion and 4 weeks afterwards.  Right now CHA2DS2-VASc score is only 1 for hypertension, but next year he turns 64 so we would be discussing indefinite anticoagulation.  If he wants to avoid that, he can discuss Watchman device implantation with EP as well. HTN: To avoid hypotension when increasing the dose of metoprolol  his losartan  to 50 mg daily. OSA: Reinforced the importance of 100% compliance with CPAP to reduce the likelihood of recurrent atrial fibrillation. Obesity: He has made a lot of progress with weight loss by using Zepbound.  Important to skip the Zepbound for 7 days before the planned cardioversion with sedation.    Informed Consent   Shared Decision Making/Informed Consent The risks (stroke, cardiac arrhythmias rarely resulting in the need for a temporary or permanent pacemaker, skin irritation or burns and complications associated with conscious sedation including aspiration, arrhythmia, respiratory failure and death), benefits (restoration of normal sinus rhythm) and alternatives of a direct current cardioversion were explained in detail to Garrett Hanson and he agrees to proceed.       Dispo:  Cardioversion scheduled with Dr. Alda Hanson on June 09, 12:30 PM (arrive at [facility] at 11:30 AM) Continue flecainide  and Eliquis  without interruption Increase metoprolol  to 50 mg twice daily Decrease losartan  to 50 mg once daily Do not take Zepbound in the 7 days prior to cardioversion Follow-up with Dr. Arlester Hanson 10/03/2023  Signed, Garrett Rumple, MD

## 2023-09-24 NOTE — Patient Instructions (Addendum)
 Medication Instructions:  Decrease Losartan  to 50 mg daily Increase Metoprolol  to 50 mg twice daily *If you need a refill on your cardiac medications before your next appointment, please call your pharmacy*  Lab Work: CBC and BMP- no later than Friday June 6 th  If you have labs (blood work) drawn today and your tests are completely normal, you will receive your results only by: MyChart Message (if you have MyChart) OR A paper copy in the mail If you have any lab test that is abnormal or we need to change your treatment, we will call you to review the results.  Testing/Procedures: Your physician has recommended that you have a Cardioversion (DCCV). Electrical Cardioversion uses a jolt of electricity to your heart either through paddles or wired patches attached to your chest. This is a controlled, usually prescheduled, procedure. Defibrillation is done under light anesthesia in the hospital, and you usually go home the day of the procedure. This is done to get your heart back into a normal rhythm. You are not awake for the procedure. Please see the instruction sheet given to you today.   (Will call to give further instructions)      Dear Garrett Hanson  You are scheduled for a Cardioversion on Monday, June 9 with Dr. Alda Amas.  Please arrive at the [facility] (Main Entrance A) at [facility]: [address] [place], [place] [address] at 11:30 AM (This time is 1 hour(s) before your procedure to ensure your preparation).   Free valet parking service is available. You will check in at ADMITTING.   *Please Note: You will receive a call the day before your procedure to confirm the appointment time. That time may have changed from the original time based on the schedule for that day.*    DIET:  Nothing to eat or drink after midnight except a sip of water  with medications (see medication instructions below)  MEDICATION INSTRUCTIONS: !!IF ANY NEW MEDICATIONS ARE STARTED AFTER TODAY,  PLEASE NOTIFY YOUR PROVIDER AS SOON AS POSSIBLE!!  FYI: Medications such as Semaglutide (Ozempic, [place]), Tirzepatide (Mounjaro, Zepbound), Dulaglutide (Trulicity), etc ("GLP1 agonists") AND Canagliflozin (Invokana), Dapagliflozin (Farxiga), Empagliflozin (Jardiance), Ertugliflozin (Steglatro), Bexagliflozin [employer]) or any combination with one of these drugs such as Invokamet (Canagliflozin/Metformin), Synjardy (Empagliflozin/Metformin), etc ("SGLT2 inhibitors") must be held around the time of a procedure. This is not a comprehensive list of all of these drugs. Please review all of your medications and talk to your provider if you take any one of these. If you are not sure, ask your provider.   HOLD: Tirzepatide (Mounjaro, Zepbound) for 7 days prior to the procedure. Last dose on Sunday, June 01. (Skip your dose for this week)   Continue taking your anticoagulant (blood thinner): Apixaban  (Eliquis ).  You will need to continue this after your procedure until you are told by your provider that it is safe to stop.    LABS:   Come to the lab at the [facility] ([address] [place], 1st Floor) between the hours of 8:00 am and 4:30 pm. You do NOT have to be fasting. Please have this done no later than Friday June 6th  FYI:  For your safety, and to allow us  to monitor your vital signs accurately during the surgery/procedure we request: If you have artificial nails, gel coating, SNS etc, please have those removed prior to your surgery/procedure. Not having the nail coverings /polish removed may result in cancellation  or delay of your surgery/procedure.  Your support person will be asked to wait in the waiting room during your procedure.  It is OK to have someone drop you off and come back when you are ready to be discharged.  You cannot drive after the procedure and will need someone to drive you home.  Bring your insurance cards.  *Special Note:  Every effort is made to have your procedure done on time. Occasionally there are emergencies that occur at the hospital that may cause delays. Please be patient if a delay does occur.      Follow-Up: At [facility], you and your health needs are our priority.  As part of our continuing mission to provide you with exceptional heart care, our providers are all part of one team.  This team includes your primary Cardiologist (physician) and Advanced Practice Providers or APPs (Physician Assistants and Nurse Practitioners) who all work together to provide you with the care you need, when you need it.  Your next appointment:   1 year(s)  Provider:   Luana Rumple, MD    We recommend signing up for the patient portal called "MyChart".  Sign up information is provided on this After Visit Summary.  MyChart is used to connect with patients for Virtual Visits (Telemedicine).  Patients are able to view lab/test results, encounter notes, upcoming appointments, etc.  Non-urgent messages can be sent to your provider as well.   To learn more about what you can do with MyChart, go to [email or web address].

## 2023-09-24 NOTE — ED Provider Notes (Signed)
  EMERGENCY DEPARTMENT AT [facility] Provider Note   CSN: [id number] Arrival date & time: 09/24/23  1326     History  Chief Complaint  Patient presents with   Laceration    Garrett Hanson is a 63 y.o. male cut the nail of his left second digit, presented to urgent care where he was referred to the emergency room due to the fact that he is on anticoagulation for atrial fibrillation as well as concerns over your nailbed injury.  He states that he controlled bleeding prior to arrival with direct pressure, denies numbness or tingling to the finger, endorses full range of motion at the DIP joint.   Laceration      Home Medications Prior to Admission medications   Medication Sig Start Date End Date Taking? Authorizing Provider  apixaban  (ELIQUIS ) 5 MG TABS tablet Take 1 tablet (5 mg total) by mouth 2 (two) times daily. 09/06/23   Knox Perl, MD  atorvastatin  (LIPITOR) 40 MG tablet Take 1 tablet (40 mg total) by mouth every evening. 09/06/23   Knox Perl, MD  Cholecalciferol (VITAMIN D3) 25 MCG (1000 UT) CAPS Take by mouth.    [provider]  famotidine  (PEPCID ) 40 MG tablet Take 40 mg by mouth daily.    [provider]  flecainide  (TAMBOCOR ) 100 MG tablet Take 1 tablet (100 mg total) by mouth 2 (two) times daily. 09/06/23   Knox Perl, MD  losartan  (COZAAR ) 50 MG tablet Take 1 tablet (50 mg total) by mouth daily. 09/24/23   Croitoru, Mihai, MD  metoprolol  tartrate (LOPRESSOR ) 50 MG tablet Take 1 tablet (50 mg total) by mouth 2 (two) times daily. 09/24/23   Croitoru, Mihai, MD  spironolactone  (ALDACTONE ) 25 MG tablet Take 1 tablet (25 mg total) by mouth daily. 09/06/23   Knox Perl, MD  tirzepatide (ZEPBOUND) 7.5 MG/0.5ML Pen 0.5 mL Subcutaneous once a week for 30 days E66.01 dose change Please fill as a VIAL    [provider]      Allergies    Penicillins, Pistachio nut (diagnostic), and Singulair [montelukast sodium]    Review of Systems    Review of Systems  Skin:  Positive for wound.  All other systems reviewed and are negative.   Physical Exam Updated Vital Signs BP (!) 115/95 (BP Location: Right Arm)   Pulse (!) 114   Temp 98.3 F (36.8 C) (Oral)   Resp 17   SpO2 98%  Physical Exam Vitals and nursing note reviewed.  Constitutional:      General: He is not in acute distress.    Appearance: Normal appearance.  HENT:     Head: Normocephalic and atraumatic.     Mouth/Throat:     Mouth: Mucous membranes are moist.     Pharynx: Oropharynx is clear.  Eyes:     Extraocular Movements: Extraocular movements intact.     Conjunctiva/sclera: Conjunctivae normal.     Pupils: Pupils are equal, round, and reactive to light.  Cardiovascular:     Rate and Rhythm: Normal rate and regular rhythm.     Pulses: Normal pulses.     Heart sounds: Normal heart sounds. No murmur heard.    No friction rub. No gallop.  Pulmonary:     Effort: Pulmonary effort is normal.     Breath sounds: Normal breath sounds.  Abdominal:     General: Abdomen is flat. Bowel sounds are normal.     Palpations: Abdomen is soft.  Musculoskeletal:  General: Normal range of motion.     Right hand: Normal.     Left hand: Laceration present.     Cervical back: Normal range of motion and neck supple.     Comments: Laceration across the fingernail that is diagonal across the nail extending down towards the nailbed.  There is no nailbed avulsion.  Skin:    General: Skin is warm and dry.     Capillary Refill: Capillary refill takes less than 2 seconds.  Neurological:     General: No focal deficit present.     Mental Status: He is alert. Mental status is at baseline.  Psychiatric:        Mood and Affect: Mood normal.     ED Results / Procedures / Treatments   Labs (all labs ordered are listed, but only abnormal results are displayed) Labs Reviewed - No data to display  EKG None  Radiology No results found.  Procedures .Laceration  Repair  Date/Time: 09/24/2023 3:00 PM  Performed by: Juanetta Nordmann, PA Authorized by: Juanetta Nordmann, PA   Consent:    Consent obtained:  Verbal   Consent given by:  Patient   Risks, benefits, and alternatives were discussed: yes     Risks discussed:  Infection, pain, retained foreign body and need for additional repair   Alternatives discussed:  No treatment, delayed treatment, observation and referral Universal protocol:    Procedure explained and questions answered to patient or proxy's satisfaction: yes     Patient identity confirmed:  Verbally with patient, arm band and hospital-assigned identification number Anesthesia:    Anesthesia method:  Nerve block   Block location:  Left second digit   Block needle gauge:  24 G   Block anesthetic:  Lidocaine  2% w/o epi   Block technique:  Volar approach   Block injection procedure:  Anatomic landmarks identified, introduced needle, incremental injection, anatomic landmarks palpated and negative aspiration for blood   Block outcome:  Anesthesia achieved Laceration details:    Location:  Finger   Finger location:  L long finger   Length (cm):  1   Depth (mm):  1 Pre-procedure details:    Preparation:  Patient was prepped and draped in usual sterile fashion Exploration:    Limited defect created (wound extended): no     Hemostasis achieved with:  Direct pressure   Contaminated: no   Treatment:    Area cleansed with:  Shur-Clens   Amount of cleaning:  Standard   Irrigation solution:  Sterile saline   Irrigation method:  Syringe   Visualized foreign bodies/material removed: no     Debridement:  None   Undermining:  None   Scar revision: no   Skin repair:    Repair method:  Tissue adhesive Approximation:    Approximation:  Loose Repair type:    Repair type:  Simple Post-procedure details:    Dressing:  Bulky dressing and non-adherent dressing   Procedure completion:  Tolerated well, no immediate  complications Comments:     Patient has laceration on the outer nail     Medications Ordered in ED Medications  lidocaine  (XYLOCAINE ) 2 % (with pres) injection 300 mg (has no administration in time range)    ED Course/ Medical Decision Making/ A&P                                 Medical Decision Making Risk Prescription drug management.  Medical Decision Making:   ISLAM EICHINGER is a 63 y.o. male who presented to the ED today with fingernail laceration on the left second digit detailed above.     Complete initial physical exam performed, notably the patient  was alert and oriented in no apparent distress, noted laceration to the nail of the left second digit, hemostasis achieved prior to this.    Reviewed and confirmed nursing documentation for past medical history, family history, social history.    Initial Assessment:   With the patient's presentation of nasal laceration, most likely diagnosis is laceration.   Initial Plan:  Wound closure as detailed in procedure note Objective evaluation as below reviewed   Reassessment and Plan:   Wound closed without complications using skin adhesive.  Will bandage and discharged with follow-up to primary care as needed.  Use of over-the-counter pain medications as needed for pain.          Final Clinical Impression(s) / ED Diagnoses Final diagnoses:  None    Rx / DC Orders ED Discharge Orders     None         Juanetta Nordmann, PA 09/24/23 1506    Lind Repine, MD 09/26/23 [phone]

## 2023-09-24 NOTE — ED Triage Notes (Signed)
 Pt states cut his lt middle finger with a knife around 9pm last night. States on blood thinners and had to put a pressure dressing on it.

## 2023-09-26 ENCOUNTER — Ambulatory Visit: Payer: Self-pay | Admitting: Cardiovascular Disease

## 2023-09-26 LAB — BASIC METABOLIC PANEL WITH GFR
BUN/Creatinine Ratio: 19 (ref 10–24)
BUN: 24 mg/dL (ref 8–27)
CO2: 17 mmol/L — ABNORMAL LOW (ref 20–29)
Calcium: 9.6 mg/dL (ref 8.6–10.2)
Chloride: 104 mmol/L (ref 96–106)
Creatinine, Ser: 1.26 mg/dL (ref 0.76–1.27)
Glucose: 95 mg/dL (ref 70–99)
Potassium: 4.4 mmol/L (ref 3.5–5.2)
Sodium: 140 mmol/L (ref 134–144)
eGFR: 64 mL/min/{1.73_m2} (ref >59)

## 2023-09-26 LAB — CBC
Hematocrit: 49.5 % (ref 37.5–51.0)
Hemoglobin: 16 g/dL (ref 13.0–17.7)
MCH: 29.4 pg (ref 26.6–33.0)
MCHC: 32.3 g/dL (ref 31.5–35.7)
MCV: 91 fL (ref 79–97)
Platelets: 211 10*3/uL (ref 150–450)
RBC: 5.45 x10E6/uL (ref 4.14–5.80)
RDW: 13.6 % (ref 11.6–15.4)
WBC: 8.7 10*3/uL (ref 3.4–10.8)

## 2023-09-27 NOTE — Progress Notes (Signed)
 Spoke to patient and instructed them to come at 1130 and to be NPO after 0000.  Medications reviewed.    Confirmed that patient will have a ride home and someone to stay with them for 24 hours after the procedure.

## 2023-09-30 ENCOUNTER — Ambulatory Visit (HOSPITAL_BASED_OUTPATIENT_CLINIC_OR_DEPARTMENT_OTHER): Admitting: Anesthesiology

## 2023-09-30 ENCOUNTER — Ambulatory Visit (HOSPITAL_COMMUNITY)
Admission: RE | Admit: 2023-09-30 | Discharge: 2023-09-30 | Disposition: A | Discharge status: Home / Self Care | Attending: Cardiology | Admitting: Cardiology

## 2023-09-30 ENCOUNTER — Encounter (HOSPITAL_COMMUNITY)
Admission: RE | Disposition: A | Discharge status: Home / Self Care | Payer: Self-pay | Source: Home / Self Care | Attending: Cardiology

## 2023-09-30 ENCOUNTER — Ambulatory Visit (HOSPITAL_COMMUNITY): Admitting: Anesthesiology

## 2023-09-30 ENCOUNTER — Encounter (HOSPITAL_COMMUNITY): Payer: Self-pay | Admitting: Cardiology

## 2023-09-30 DIAGNOSIS — I10 Essential (primary) hypertension: Secondary | ICD-10-CM | POA: Insufficient documentation

## 2023-09-30 DIAGNOSIS — Z6836 Body mass index (BMI) 36.0-36.9, adult: Secondary | ICD-10-CM | POA: Diagnosis not present

## 2023-09-30 DIAGNOSIS — G4733 Obstructive sleep apnea (adult) (pediatric): Secondary | ICD-10-CM

## 2023-09-30 DIAGNOSIS — Z79899 Other long term (current) drug therapy: Secondary | ICD-10-CM | POA: Diagnosis not present

## 2023-09-30 DIAGNOSIS — E66812 Obesity, class 2: Secondary | ICD-10-CM | POA: Diagnosis not present

## 2023-09-30 DIAGNOSIS — I4819 Other persistent atrial fibrillation: Secondary | ICD-10-CM | POA: Insufficient documentation

## 2023-09-30 DIAGNOSIS — I4892 Unspecified atrial flutter: Secondary | ICD-10-CM | POA: Diagnosis not present

## 2023-09-30 DIAGNOSIS — E785 Hyperlipidemia, unspecified: Secondary | ICD-10-CM | POA: Diagnosis not present

## 2023-09-30 DIAGNOSIS — I4891 Unspecified atrial fibrillation: Secondary | ICD-10-CM | POA: Diagnosis not present

## 2023-09-30 DIAGNOSIS — Z7901 Long term (current) use of anticoagulants: Secondary | ICD-10-CM | POA: Diagnosis not present

## 2023-09-30 DIAGNOSIS — E7800 Pure hypercholesterolemia, unspecified: Secondary | ICD-10-CM | POA: Diagnosis not present

## 2023-09-30 HISTORY — PX: CARDIOVERSION: EP1203

## 2023-09-30 SURGERY — CARDIOVERSION (CATH LAB)
Anesthesia: General

## 2023-09-30 MED ORDER — LIDOCAINE 2% (20 MG/ML) 5 ML SYRINGE
INTRAMUSCULAR | Status: DC | PRN
  Administered 2023-09-30: 50 mg via INTRAVENOUS

## 2023-09-30 MED ORDER — PROPOFOL 10 MG/ML IV BOLUS
INTRAVENOUS | Status: DC | PRN
Start: 2023-09-30 — End: 2023-09-30
  Administered 2023-09-30: 50 mg via INTRAVENOUS

## 2023-09-30 MED ORDER — SODIUM CHLORIDE 0.9% FLUSH: 3.0000 mL | Freq: Two times a day (BID) | INTRAVENOUS | Status: DC

## 2023-09-30 MED ORDER — SODIUM CHLORIDE 0.9% FLUSH
3.0000 mL | INTRAVENOUS | Status: DC | PRN
  Administered 2023-09-30: 5 mL via INTRAVENOUS

## 2023-09-30 SURGICAL SUPPLY — 1 items: PAD DEFIB RADIO PHYSIO CONN (PAD) ×1 IMPLANT

## 2023-09-30 NOTE — Anesthesia Postprocedure Evaluation (Signed)
 Anesthesia Post Note  Patient: Garrett Hanson  Procedure(s) Performed: CARDIOVERSION     Patient location during evaluation: PACU Anesthesia Type: General Level of consciousness: awake and alert and oriented Pain management: pain level controlled Vital Signs Assessment: post-procedure vital signs reviewed and stable Respiratory status: spontaneous breathing, nonlabored ventilation and respiratory function stable Cardiovascular status: blood pressure returned to baseline and stable Postop Assessment: no apparent nausea or vomiting Anesthetic complications: no   No notable events documented.  Last Vitals:  Vitals:   09/30/23 1345 09/30/23 1400  BP: (!) 117/91 120/80  Pulse: 75 76  Resp: 16 16  Temp:    SpO2: 97% 98%    Last Pain:  Vitals:   09/30/23 1325  TempSrc:   PainSc: 0-No pain                 Kurt Hoffmeier A.

## 2023-09-30 NOTE — CV Procedure (Signed)
 Procedure:   DCCV  Indication:  Symptomatic atrial fibrillation  Procedure Note:  The patient signed informed consent.  They have had had therapeutic anticoagulation with Eliquis  greater than 3 weeks.  Anesthesia was administered by Dr. Yvonnie Heritage and Adelene Homer, CRNA.  Adequate airway was maintained throughout and vital followed per protocol.  They were cardioverted x 1 with 200J of biphasic synchronized energy.  They converted to NSR with rate 70s.  There were no apparent complications.  The patient had normal neuro status and respiratory status post procedure with vitals stable as recorded elsewhere.    Follow up:  They will continue on current medical therapy and follow up with cardiology as scheduled.  Carson Clara, MD 09/30/2023 1:20 PM

## 2023-09-30 NOTE — Interval H&P Note (Signed)
 History and Physical Interval Note:  09/30/2023 12:07 PM  Garrett Hanson  has presented today for surgery, with the diagnosis of AFIB.  The various methods of treatment have been discussed with the patient and family. After consideration of risks, benefits and other options for treatment, the patient has consented to  Procedure(s): CARDIOVERSION (N/A) as a surgical intervention.  The patient's history has been reviewed, patient examined, no change in status, stable for surgery.  I have reviewed the patient's chart and labs.  Questions were answered to the patient's satisfaction.     Wendie Hamburg

## 2023-09-30 NOTE — Anesthesia Preprocedure Evaluation (Signed)
 Anesthesia Evaluation  Patient identified by MRN, date of birth, ID band Patient awake    Reviewed: Allergy & Precautions, NPO status , Patient's Chart, lab work & pertinent test results, reviewed documented beta blocker date and time   History of Anesthesia Complications (+) history of anesthetic complications  Airway Mallampati: I  TM Distance: >3 FB Neck ROM: Full    Dental no notable dental hx. (+) Teeth Intact, Dental Advisory Given, Caps   Pulmonary sleep apnea and Continuous Positive Airway Pressure Ventilation , pneumonia, resolved   Pulmonary exam normal breath sounds clear to auscultation       Cardiovascular hypertension, Pt. on medications and Pt. on home beta blockers Normal cardiovascular exam+ dysrhythmias Atrial Fibrillation  Rhythm:Regular Rate:Normal     Neuro/Psych negative neurological ROS  negative psych ROS   GI/Hepatic negative GI ROS, Neg liver ROS,,,  Endo/Other  Obesity HLD  Renal/GU Renal diseaseHx/o renal calculi  negative genitourinary   Musculoskeletal  (+) Arthritis , Osteoarthritis,    Abdominal  (+) + obese  Peds  Hematology Eliquis  therapy- last dose this am   Anesthesia Other Findings   Reproductive/Obstetrics                              Anesthesia Physical Anesthesia Plan  ASA: 3  Anesthesia Plan: General   Post-op Pain Management: Minimal or no pain anticipated   Induction: Intravenous  PONV Risk Score and Plan: 2 and Treatment may vary due to age or medical condition  Airway Management Planned: Natural Airway, Mask and Nasal Cannula  Additional Equipment: None  Intra-op Plan:   Post-operative Plan:   Informed Consent: I have reviewed the patients History and Physical, chart, labs and discussed the procedure including the risks, benefits and alternatives for the proposed anesthesia with the patient or authorized representative who has  indicated his/her understanding and acceptance.     Dental advisory given  Plan Discussed with: CRNA and Anesthesiologist  Anesthesia Plan Comments:          Anesthesia Quick Evaluation

## 2023-09-30 NOTE — Transfer of Care (Signed)
 Immediate Anesthesia Transfer of Care Note  Patient: Garrett Hanson  Procedure(s) Performed: CARDIOVERSION  Patient Location: Cath Lab Holding room 2  Anesthesia Type:MAC  Level of Consciousness: awake, alert , oriented, patient cooperative, and responds to stimulation  Airway & Oxygen Therapy: Patient Spontanous Breathing and Patient connected to nasal cannula oxygen  Post-op Assessment: Report given to RN and Post -op Vital signs reviewed and stable  Post vital signs: Reviewed and stable  Last Vitals:  Vitals Value Taken Time  BP    Temp    Pulse    Resp    SpO2      Last Pain:  Vitals:   09/30/23 1149  TempSrc: Temporal  PainSc: 0-No pain         Complications: No notable events documented.

## 2023-10-03 ENCOUNTER — Ambulatory Visit: Attending: Cardiovascular Disease | Admitting: Cardiovascular Disease

## 2023-10-03 ENCOUNTER — Encounter: Payer: Self-pay | Admitting: Cardiovascular Disease

## 2023-10-03 VITALS — BP 116/74 | HR 71 | Ht 73.5 in | Wt 280.0 lb

## 2023-10-03 DIAGNOSIS — I4819 Other persistent atrial fibrillation: Secondary | ICD-10-CM

## 2023-10-03 MED ORDER — METOPROLOL TARTRATE 25 MG PO TABS: 25.0000 mg | ORAL_TABLET | Freq: Two times a day (BID) | ORAL | 3 refills | Status: DC

## 2023-10-03 NOTE — H&P (View-Only) (Signed)
 Electrophysiology Office Note:    Date:  10/03/2023   ID:  Garrett Hanson, DOB Feb 19, 1961, MRN [id number]  PCP:  Jimmey Mould, MD   [facility] Providers Cardiologist:  Luana Rumple, MD     Referring MD: Jimmey Mould, MD   History of Present Illness:    Garrett Hanson is a 63 y.o. male with a medical history significant for persistent atrial fibrillation, obesity, sleep apnea on CPAP, CKD 3 referred for atrial fibrillation management.     S/p pulmonary vein isolation by Dr. Nunzio Belch in August 2021 for persistent atrial fibrillation.  Prior to this he tried and failed dofetilide  therapy and had multiple cardioversions.  He had recurrence of atrial fibrillation in May 2025 and was started on flecainide  which organized his rhythm to typical-appearing right atrial flutter.  He underwent DC cardioversion on September 30, 2023 and has remained in sinus rhythm since        Today, he reports that he is at baseline and feels well.  EKGs/Labs/Other Studies Reviewed Today:     Echocardiogram:  TTE Sep 12, 2023 LVEF 60 to 65%.  Mild LVH.  Mildly dilated left atrium, mildly dilated right atrium.    EKG:   EKG Interpretation Date/Time:  Thursday October 03 2023 15:05:20 EDT Ventricular Rate:  71 PR Interval:  230 QRS Duration:  110 QT Interval:  410 QTC Calculation: 445 R Axis:   0  Text Interpretation: Sinus rhythm with 1st degree A-V block with Premature supraventricular complexes When compared with ECG of 30-Sep-2023 13:23, Premature supraventricular complexes are now Present Confirmed by Marlane Silver [phone]) on 10/03/2023 3:45:35 PM     Physical Exam:    VS:  BP 116/74   Pulse 71   Ht 6' 1.5 (1.867 m)   Wt 280 lb (127 kg)   SpO2 98%   BMI 36.44 kg/m     Wt Readings from Last 3 Encounters:  10/03/23 280 lb (127 kg)  09/24/23 281 lb 3.2 oz (127.6 kg)  09/06/23 284 lb (128.8 kg)     GEN: Well nourished, well developed in no acute  distress CARDIAC: RRR, no murmurs, rubs, gallops RESPIRATORY:  Normal work of breathing MUSCULOSKELETAL: no edema    ASSESSMENT & PLAN:     Persistent atrial fibrillation, typical atrial flutter Status post pulmonary vein isolation in 2020 by Dr. Nunzio Belch Now with recurrence of symptomatic fibrillation Organized to typical-appearing flutter on flecainide  Maintaining sinus rhythm after DC cardioversion We discussed management options and using a shared decision making approach opted to schedule ablation.  We discussed the indication, rationale, logistics, anticipated benefits, and potential risks of the ablation procedure including but not limited to -- bleed at the groin access site, chest pain, damage to nearby organs such as the diaphragm, lungs, or esophagus, need for a drainage tube, or prolonged hospitalization. I explained that the risk for stroke, heart attack, need for open chest surgery, or even death is very low but not zero. he  expressed understanding and wishes to proceed.   Secondary hypercoagulable state CHA2DS2-VASc score is 1 Continue Eliquis  for the perioperative period He will turn 64 in February 2026  Hypertension He is noticing increased fatigue on metoprolol  50 mg Now that he is maintaining sinus rhythm with flecainide , I am going to decrease the metoprolol  to 25 mg BP controlled today  Obesity He has been encouraged to be compliant with CPAP to reduce the likelihood of A-fib recurrence  Obesity He is making progress  with weight loss on Zepbound    Signed, Efraim Grange, MD  10/03/2023 4:31 PM    [facility]

## 2023-10-03 NOTE — Progress Notes (Signed)
 Electrophysiology Office Note:    Date:  10/03/2023   ID:  Garrett Hanson, DOB Feb 19, 1961, MRN [id number]  PCP:  Jimmey Mould, MD   [facility] Providers Cardiologist:  Luana Rumple, MD     Referring MD: Jimmey Mould, MD   History of Present Illness:    Garrett Hanson is a 63 y.o. male with a medical history significant for persistent atrial fibrillation, obesity, sleep apnea on CPAP, CKD 3 referred for atrial fibrillation management.     S/p pulmonary vein isolation by Dr. Nunzio Belch in August 2021 for persistent atrial fibrillation.  Prior to this he tried and failed dofetilide  therapy and had multiple cardioversions.  He had recurrence of atrial fibrillation in May 2025 and was started on flecainide  which organized his rhythm to typical-appearing right atrial flutter.  He underwent DC cardioversion on September 30, 2023 and has remained in sinus rhythm since        Today, he reports that he is at baseline and feels well.  EKGs/Labs/Other Studies Reviewed Today:     Echocardiogram:  TTE Sep 12, 2023 LVEF 60 to 65%.  Mild LVH.  Mildly dilated left atrium, mildly dilated right atrium.    EKG:   EKG Interpretation Date/Time:  Thursday October 03 2023 15:05:20 EDT Ventricular Rate:  71 PR Interval:  230 QRS Duration:  110 QT Interval:  410 QTC Calculation: 445 R Axis:   0  Text Interpretation: Sinus rhythm with 1st degree A-V block with Premature supraventricular complexes When compared with ECG of 30-Sep-2023 13:23, Premature supraventricular complexes are now Present Confirmed by Marlane Silver [phone]) on 10/03/2023 3:45:35 PM     Physical Exam:    VS:  BP 116/74   Pulse 71   Ht 6' 1.5 (1.867 m)   Wt 280 lb (127 kg)   SpO2 98%   BMI 36.44 kg/m     Wt Readings from Last 3 Encounters:  10/03/23 280 lb (127 kg)  09/24/23 281 lb 3.2 oz (127.6 kg)  09/06/23 284 lb (128.8 kg)     GEN: Well nourished, well developed in no acute  distress CARDIAC: RRR, no murmurs, rubs, gallops RESPIRATORY:  Normal work of breathing MUSCULOSKELETAL: no edema    ASSESSMENT & PLAN:     Persistent atrial fibrillation, typical atrial flutter Status post pulmonary vein isolation in 2020 by Dr. Nunzio Belch Now with recurrence of symptomatic fibrillation Organized to typical-appearing flutter on flecainide  Maintaining sinus rhythm after DC cardioversion We discussed management options and using a shared decision making approach opted to schedule ablation.  We discussed the indication, rationale, logistics, anticipated benefits, and potential risks of the ablation procedure including but not limited to -- bleed at the groin access site, chest pain, damage to nearby organs such as the diaphragm, lungs, or esophagus, need for a drainage tube, or prolonged hospitalization. I explained that the risk for stroke, heart attack, need for open chest surgery, or even death is very low but not zero. he  expressed understanding and wishes to proceed.   Secondary hypercoagulable state CHA2DS2-VASc score is 1 Continue Eliquis  for the perioperative period He will turn 64 in February 2026  Hypertension He is noticing increased fatigue on metoprolol  50 mg Now that he is maintaining sinus rhythm with flecainide , I am going to decrease the metoprolol  to 25 mg BP controlled today  Obesity He has been encouraged to be compliant with CPAP to reduce the likelihood of A-fib recurrence  Obesity He is making progress  with weight loss on Zepbound    Signed, Efraim Grange, MD  10/03/2023 4:31 PM    [facility]

## 2023-10-03 NOTE — Patient Instructions (Signed)
 Medication Instructions:  DECREASE Metoprolol  Tartrate to 25 mg twice daily  *If you need a refill on your cardiac medications before your next appointment, please call your pharmacy*  Lab Work: CBC and BMET - please have pre-procedure lab work completed on Monday, July 14 . This can be done at ANY [facility] near you - no appointment required and this does not have to be fasting. If you have labs (blood work) drawn today and your tests are completely normal, you will receive your results only by: MyChart Message (if you have MyChart) OR A paper copy in the mail If you have any lab test that is abnormal or we need to change your treatment, we will call you to review the results.  Testing/Procedures: Cardiac CT - someone will contact you to schedule this  Your physician has requested that you have cardiac CT. Cardiac computed tomography (CT) is a painless test that uses an x-ray machine to take clear, detailed pictures of your heart. For further information please visit [email or web address] Please follow instruction sheet as given.   Atrial Fibrillation Ablation - scheduled on Monday, August 11. We will be in contact closer to your ablation date with further instructions Your physician has recommended that you have an ablation. Catheter ablation is a medical procedure used to treat some cardiac arrhythmias (irregular heartbeats). During catheter ablation, a long, thin, flexible tube is put into a blood vessel in your groin (upper thigh), or neck. This tube is called an ablation catheter. It is then guided to your heart through the blood vessel. Radio frequency waves destroy small areas of heart tissue where abnormal heartbeats may cause an arrhythmia to start. Please see the instruction sheet given to you today.  Follow-Up: At [facility], you and your health needs are our priority.  As part of our continuing mission to provide you with exceptional heart care, our providers are all part of  one team.  This team includes your primary Cardiologist (physician) and Advanced Practice Providers or APPs (Physician Assistants and Nurse Practitioners) who all work together to provide you with the care you need, when you need it.  Your next appointment:   We will schedule follow up after your ablation  Provider:   Marlane Silver, MD   Cardiac Ablation Cardiac ablation is a procedure to destroy, or ablate, a small amount of heart tissue that is causing problems. The heart has many electrical connections. Sometimes, these connections are abnormal and can cause the heart to beat very fast or irregularly. Ablating the abnormal areas can improve the heart's rhythm or return it to normal. Ablation may be done for people who: Have irregular or rapid heartbeats (arrhythmias). Have Wolff-Parkinson-White syndrome. Have taken medicines for an arrhythmia that did not work or caused side effects. Have a high-risk heartbeat that may be life-threatening. Tell a health care provider about: Any allergies you have. All medicines you are taking, including vitamins, herbs, eye drops, creams, and over-the-counter medicines. Any problems you or family members have had with anesthesia. Any bleeding problems you have. Any surgeries you have had. Any medical conditions you have. Whether you are pregnant or may be pregnant. What are the risks? Your health care provider will talk with you about risks. These may include: Infection. Bruising and bleeding. Stroke or blood clots. Damage to nearby structures or organs. Allergic reaction to medicines or dyes. Needing a pacemaker if the heart gets damaged. A pacemaker is a device that helps the heart beat normally. Failure of the  procedure. A repeat procedure may be needed. What happens before the procedure? Medicines Ask your health care provider about: Changing or stopping your regular medicines. These include any heart rhythm medicines, diabetes medicines,  or blood thinners you take. Taking medicines such as aspirin and ibuprofen. These medicines can thin your blood. Do not take them unless your health care provider tells you to. Taking over-the-counter medicines, vitamins, herbs, and supplements. General instructions Follow instructions from your health care provider about what you may eat and drink. If you will be going home right after the procedure, plan to have a responsible adult: Take you home from the hospital or clinic. You will not be allowed to drive. Care for you for the time you are told. Ask your health care provider what steps will be taken to prevent infection. What happens during the procedure?  An IV will be inserted into one of your veins. You may be given: A sedative. This helps you relax. Anesthesia. This will: Numb certain areas of your body. An incision will be made in your neck or your groin. A needle will be inserted through the incision and into a large vein in your neck or groin. The small, thin tube (catheter) will be inserted through the needle and moved to your heart. A type of X-ray (fluoroscopy) will be used to help guide the catheter and provide images of the heart on a monitor. Dye may be injected through the catheter to help your surgeon see the area of the heart that needs treatment. Electrical currents will be sent from the catheter to destroy heart tissue in certain areas. There are three types of energy that may be used to do this: Heat (radiofrequency energy). Laser energy. Extreme cold (cryoablation). When the tissue has been destroyed, the catheter will be removed. Pressure will be held on the insertion area to prevent bleeding. A bandage (dressing) will be placed over the insertion area. The procedure may vary among health care providers and hospitals. What happens after the procedure? Your blood pressure, heart rate and rhythm, breathing rate, and blood oxygen level will be monitored until you  leave the hospital or clinic. Your insertion area will be checked for bleeding. You will need to lie still for a few hours. If your groin was used, you will need to keep your leg straight for a few hours after the catheter is removed. This information is not intended to replace advice given to you by your health care provider. Make sure you discuss any questions you have with your health care provider. Document Revised: 09/26/2021 Document Reviewed: 09/26/2021 Elsevier Patient Education  2024 [employer].

## 2023-10-04 ENCOUNTER — Telehealth: Payer: Self-pay

## 2023-10-04 NOTE — Telephone Encounter (Signed)
 Spoke with patient, ablation now scheduled for 10/24/23 at 0730 with Dr Arlester Ladd - labs already completed and CT will be done on 10/11/23. Sent both instruction letters thru MyChart and reviewed with patient. No questions at this time

## 2023-10-04 NOTE — Telephone Encounter (Signed)
 Attempted to contact patient to move ablation up, unable to leave message

## 2023-10-10 ENCOUNTER — Telehealth: Payer: Self-pay

## 2023-10-10 DIAGNOSIS — I4819 Other persistent atrial fibrillation: Secondary | ICD-10-CM

## 2023-10-10 NOTE — Telephone Encounter (Signed)
 Pre-procedure Call :   Called patient, NA, left message to contact our office.     Mealor 10/24/23 @ 7:30 (arrive 5:30am) CT : 10/11/23 @ 12:30 (arrive @ 12pm) Labs : same day as CT -Released

## 2023-10-11 ENCOUNTER — Ambulatory Visit (HOSPITAL_COMMUNITY)
Admission: RE | Admit: 2023-10-11 | Discharge: 2023-10-11 | Disposition: A | Discharge status: Home / Self Care | Source: Ambulatory Visit | Attending: Cardiology | Admitting: Cardiology

## 2023-10-11 DIAGNOSIS — I77819 Aortic ectasia, unspecified site: Secondary | ICD-10-CM | POA: Insufficient documentation

## 2023-10-11 DIAGNOSIS — I700 Atherosclerosis of aorta: Secondary | ICD-10-CM | POA: Diagnosis not present

## 2023-10-11 DIAGNOSIS — I4819 Other persistent atrial fibrillation: Secondary | ICD-10-CM | POA: Insufficient documentation

## 2023-10-11 MED ORDER — IOHEXOL 350 MG/ML SOLN
100.0000 mL | Freq: Once | INTRAVENOUS | Status: AC | PRN
  Administered 2023-10-11: 100 mL via INTRAVENOUS

## 2023-10-17 ENCOUNTER — Telehealth (HOSPITAL_COMMUNITY): Payer: Self-pay

## 2023-10-17 NOTE — Telephone Encounter (Signed)
 Spoke with patient to discuss upcoming procedure.   CT: completed.  Labs: completed.   Any recent signs of acute illness or been started on antibiotics? No Any new medications started? No Any medications to hold? Hold Zepbound for 7 days prior- last dose on June 20.  Any missed doses of blood thinner? No  Advised patient to continue taking ANTICOAGULANT: Eliquis  (Apixaban ) twice daily without missing any doses.  Medication instructions:  On the morning of your procedure DO NOT take any medication., including Eliquis  or the procedure may be rescheduled. Nothing to eat or drink after midnight prior to your procedure.  Confirmed patient is scheduled for Atrial Fibrillation Ablation on Thursday, July 3 with Dr. Eulas Furbish. Instructed patient to arrive at the Main Entrance A at [facility]: [address] [place], [place] [address] and check in at Admitting at 5:30 AM.  Advised of plan to go home the same day and will only stay overnight if medically necessary. You MUST have a responsible adult to drive you home and MUST be with you the first 24 hours after you arrive home or your procedure could be cancelled.  Patient verbalized understanding to all instructions provided and agreed to proceed with procedure.

## 2023-10-18 ENCOUNTER — Encounter: Payer: Self-pay | Admitting: Cardiovascular Disease

## 2023-10-23 ENCOUNTER — Encounter: Payer: Self-pay | Admitting: Emergency Medicine

## 2023-10-23 NOTE — Pre-Procedure Instructions (Signed)
 Attempted to call patient regarding procedure instructions.  Left voicemail on the following items: Arrival time 0515 Nothing to eat or drink after midnight No meds AM of procedure Responsible person to drive you home and stay with you for 24 hrs  Have you missed any doses of anti-coagulant Eliquis- should be taken twice a day, if you have missed any doses please let us know.  Don't take dose morning of procedure.

## 2023-10-24 ENCOUNTER — Ambulatory Visit (HOSPITAL_BASED_OUTPATIENT_CLINIC_OR_DEPARTMENT_OTHER): Payer: Self-pay | Admitting: Certified Registered Nurse Anesthetist

## 2023-10-24 ENCOUNTER — Encounter (HOSPITAL_COMMUNITY)
Admission: RE | Disposition: A | Discharge status: Home / Self Care | Payer: Self-pay | Source: Home / Self Care | Attending: Cardiovascular Disease

## 2023-10-24 ENCOUNTER — Ambulatory Visit (HOSPITAL_COMMUNITY)
Admission: RE | Admit: 2023-10-24 | Discharge: 2023-10-24 | Disposition: A | Discharge status: Home / Self Care | Attending: Cardiovascular Disease | Admitting: Cardiovascular Disease

## 2023-10-24 ENCOUNTER — Other Ambulatory Visit: Payer: Self-pay

## 2023-10-24 ENCOUNTER — Ambulatory Visit (HOSPITAL_COMMUNITY): Payer: Self-pay | Admitting: Certified Registered Nurse Anesthetist

## 2023-10-24 DIAGNOSIS — I4819 Other persistent atrial fibrillation: Secondary | ICD-10-CM | POA: Diagnosis present

## 2023-10-24 DIAGNOSIS — I4891 Unspecified atrial fibrillation: Secondary | ICD-10-CM | POA: Diagnosis not present

## 2023-10-24 DIAGNOSIS — Z7901 Long term (current) use of anticoagulants: Secondary | ICD-10-CM | POA: Diagnosis not present

## 2023-10-24 DIAGNOSIS — Z79899 Other long term (current) drug therapy: Secondary | ICD-10-CM | POA: Insufficient documentation

## 2023-10-24 DIAGNOSIS — E669 Obesity, unspecified: Secondary | ICD-10-CM | POA: Diagnosis not present

## 2023-10-24 DIAGNOSIS — I483 Typical atrial flutter: Secondary | ICD-10-CM

## 2023-10-24 DIAGNOSIS — G4733 Obstructive sleep apnea (adult) (pediatric): Secondary | ICD-10-CM

## 2023-10-24 DIAGNOSIS — I129 Hypertensive chronic kidney disease with stage 1 through stage 4 chronic kidney disease, or unspecified chronic kidney disease: Secondary | ICD-10-CM | POA: Insufficient documentation

## 2023-10-24 DIAGNOSIS — N183 Chronic kidney disease, stage 3 unspecified: Secondary | ICD-10-CM | POA: Diagnosis not present

## 2023-10-24 DIAGNOSIS — Z6836 Body mass index (BMI) 36.0-36.9, adult: Secondary | ICD-10-CM | POA: Diagnosis not present

## 2023-10-24 DIAGNOSIS — G4730 Sleep apnea, unspecified: Secondary | ICD-10-CM | POA: Insufficient documentation

## 2023-10-24 DIAGNOSIS — D6869 Other thrombophilia: Secondary | ICD-10-CM | POA: Diagnosis not present

## 2023-10-24 HISTORY — PX: ATRIAL FIBRILLATION ABLATION: EP1191

## 2023-10-24 LAB — POCT ACTIVATED CLOTTING TIME: Activated Clotting Time: 354 s

## 2023-10-24 SURGERY — ATRIAL FIBRILLATION ABLATION
Anesthesia: General

## 2023-10-24 MED ORDER — PROTAMINE SULFATE 10 MG/ML IV SOLN
INTRAVENOUS | Status: DC | PRN
  Administered 2023-10-24: 40 mg via INTRAVENOUS
  Administered 2023-10-24: 10 mg via INTRAVENOUS

## 2023-10-24 MED ORDER — SODIUM CHLORIDE 0.9% FLUSH: 3.0000 mL | INTRAVENOUS | Status: DC | PRN

## 2023-10-24 MED ORDER — MIDAZOLAM HCL 2 MG/2ML IJ SOLN
INTRAMUSCULAR | Status: AC
Start: 2023-10-24 — End: 2023-10-24
  Filled 2023-10-24: qty 2

## 2023-10-24 MED ORDER — ACETAMINOPHEN 325 MG PO TABS: 650.0000 mg | ORAL_TABLET | ORAL | Status: DC | PRN

## 2023-10-24 MED ORDER — PHENYLEPHRINE HCL-NACL 20-0.9 MG/250ML-% IV SOLN
INTRAVENOUS | Status: DC | PRN
  Administered 2023-10-24: 200 ug via INTRAVENOUS
  Administered 2023-10-24: 20 ug/min via INTRAVENOUS

## 2023-10-24 MED ORDER — SUGAMMADEX SODIUM 200 MG/2ML IV SOLN
INTRAVENOUS | Status: DC | PRN
  Administered 2023-10-24: 249.4 mg via INTRAVENOUS

## 2023-10-24 MED ORDER — HEPARIN SODIUM (PORCINE) 1000 UNIT/ML IJ SOLN
INTRAMUSCULAR | Status: DC | PRN
  Administered 2023-10-24: 20000 [IU] via INTRAVENOUS

## 2023-10-24 MED ORDER — FENTANYL CITRATE (PF) 100 MCG/2ML IJ SOLN
INTRAMUSCULAR | Status: DC | PRN
  Administered 2023-10-24: 100 ug via INTRAVENOUS

## 2023-10-24 MED ORDER — FENTANYL CITRATE (PF) 100 MCG/2ML IJ SOLN
INTRAMUSCULAR | Status: AC
  Filled 2023-10-24: qty 2

## 2023-10-24 MED ORDER — NITROGLYCERIN 1 MG/10 ML FOR IR/CATH LAB
INTRA_ARTERIAL | Status: AC
  Filled 2023-10-24: qty 10

## 2023-10-24 MED ORDER — ATROPINE SULFATE 1 MG/10ML IJ SOSY
PREFILLED_SYRINGE | INTRAMUSCULAR | Status: AC
  Filled 2023-10-24: qty 10

## 2023-10-24 MED ORDER — LIDOCAINE 2% (20 MG/ML) 5 ML SYRINGE
INTRAMUSCULAR | Status: DC | PRN
  Administered 2023-10-24: 100 mg via INTRAVENOUS

## 2023-10-24 MED ORDER — PROPOFOL 10 MG/ML IV BOLUS
INTRAVENOUS | Status: DC | PRN
  Administered 2023-10-24: 200 mg via INTRAVENOUS

## 2023-10-24 MED ORDER — SODIUM CHLORIDE 0.9 % IV SOLN: 250.0000 mL | INTRAVENOUS | Status: DC | PRN

## 2023-10-24 MED ORDER — ROCURONIUM BROMIDE 100 MG/10ML IV SOLN
INTRAVENOUS | Status: DC | PRN
  Administered 2023-10-24: 20 mg via INTRAVENOUS
  Administered 2023-10-24: 10 mg via INTRAVENOUS
  Administered 2023-10-24: 70 mg via INTRAVENOUS
  Administered 2023-10-24: 10 mg via INTRAVENOUS

## 2023-10-24 MED ORDER — ATROPINE SULFATE 1 MG/10ML IJ SOSY
PREFILLED_SYRINGE | INTRAMUSCULAR | Status: DC | PRN
Start: 2023-10-24 — End: 2023-10-24
  Administered 2023-10-24: 1 mg via INTRAVENOUS

## 2023-10-24 MED ORDER — ONDANSETRON HCL 4 MG/2ML IJ SOLN
INTRAMUSCULAR | Status: DC | PRN
  Administered 2023-10-24: 4 mg via INTRAVENOUS

## 2023-10-24 MED ORDER — DEXAMETHASONE SODIUM PHOSPHATE 10 MG/ML IJ SOLN
INTRAMUSCULAR | Status: DC | PRN
  Administered 2023-10-24: 10 mg via INTRAVENOUS

## 2023-10-24 MED ORDER — MIDAZOLAM HCL 2 MG/2ML IJ SOLN
INTRAMUSCULAR | Status: DC | PRN
  Administered 2023-10-24: 2 mg via INTRAVENOUS

## 2023-10-24 MED ORDER — ONDANSETRON HCL 4 MG/2ML IJ SOLN: 4.0000 mg | Freq: Four times a day (QID) | INTRAMUSCULAR | Status: DC | PRN

## 2023-10-24 MED ORDER — HEPARIN (PORCINE) IN NACL 1000-0.9 UT/500ML-% IV SOLN
INTRAVENOUS | Status: DC | PRN
  Administered 2023-10-24 (×3): 500 mL

## 2023-10-24 MED ORDER — SODIUM CHLORIDE 0.9 % IV SOLN: INTRAVENOUS | Status: DC

## 2023-10-24 MED ORDER — PHENYLEPHRINE 80 MCG/ML (10ML) SYRINGE FOR IV PUSH (FOR BLOOD PRESSURE SUPPORT)
PREFILLED_SYRINGE | INTRAVENOUS | Status: DC | PRN
  Administered 2023-10-24: 160 ug via INTRAVENOUS

## 2023-10-24 MED ORDER — NITROGLYCERIN 1 MG/10 ML FOR IR/CATH LAB
INTRA_ARTERIAL | Status: DC | PRN
  Administered 2023-10-24: 20 mL

## 2023-10-24 SURGICAL SUPPLY — 20 items
BLANKET WARM UNDERBOD FULL ACC (MISCELLANEOUS) ×1 IMPLANT
CABLE FARASTAR GEN2 SNGL USE (CABLE) IMPLANT
CATH FARAWAVE 2.0 31 (CATHETERS) IMPLANT
CATH GE 8FR SOUNDSTAR (CATHETERS) IMPLANT
CATH OCTARAY 2.0 F 3-3-3-3-3 (CATHETERS) IMPLANT
CATH WEBSTER BI DIR CS D-F CRV (CATHETERS) IMPLANT
CLOSURE PERCLOSE PROSTYLE (VASCULAR PRODUCTS) IMPLANT
COVER SWIFTLINK CONNECTOR (BAG) ×1 IMPLANT
DEVICE CLOSURE MYNXGRIP 6/7F (Vascular Products) IMPLANT
DILATOR VESSEL 38 20CM 16FR (INTRODUCER) IMPLANT
GUIDEWIRE INQWIRE 1.5J.035X260 (WIRE) IMPLANT
KIT VERSACROSS CNCT FARADRIVE (KITS) IMPLANT
MAT PREVALON FULL STRYKER (MISCELLANEOUS) IMPLANT
PACK EP LF (CUSTOM PROCEDURE TRAY) ×1 IMPLANT
PAD DEFIB RADIO PHYSIO CONN (PAD) ×1 IMPLANT
PATCH CARTO3 (PAD) IMPLANT
SHEATH FARADRIVE STEERABLE (SHEATH) IMPLANT
SHEATH PINNACLE 8F 10CM (SHEATH) IMPLANT
SHEATH PINNACLE 9F 10CM (SHEATH) IMPLANT
SHEATH PROBE COVER 6X72 (BAG) IMPLANT

## 2023-10-24 NOTE — Interval H&P Note (Signed)
 History and Physical Interval Note:  10/24/2023 7:18 AM  Garrett Hanson  has presented today for surgery, with the diagnosis of atrial fibrillation.  The various methods of treatment have been discussed with the patient and family. After consideration of risks, benefits and other options for treatment, the patient has consented to  Procedure(s): ATRIAL FIBRILLATION ABLATION (N/A) as a surgical intervention.  The patient's history has been reviewed, patient examined, no change in status, stable for surgery.  I have reviewed the patient's chart and labs.  Questions were answered to the patient's satisfaction.    I reviewed the patient's CT and labs. There was no LAA thrombus. he  has not missed any doses of anticoagulation, and he took his dose last night. There have been no changes in the patient's diagnoses, medications, or condition since our recent clinic visit.   Smt. Loder E Helena Sardo

## 2023-10-24 NOTE — Discharge Instructions (Signed)

## 2023-10-24 NOTE — Transfer of Care (Signed)
 Immediate Anesthesia Transfer of Care Note  Patient: Garrett Hanson  Procedure(s) Performed: ATRIAL FIBRILLATION ABLATION  Patient Location: Cath Lab  Anesthesia Type:General  Level of Consciousness: awake, alert , and oriented  Airway & Oxygen Therapy: Patient Spontanous Breathing and Patient connected to face mask oxygen  Post-op Assessment: Report given to RN, Post -op Vital signs reviewed and stable, Patient moving all extremities X 4, and Patient able to stick tongue midline  Post vital signs: Reviewed and stable  Last Vitals:  Vitals Value Taken Time  BP 128/87 10/24/23 09:46  Temp 98.2   Pulse 81 10/24/23 09:48  Resp 17 10/24/23 09:48  SpO2 100 % 10/24/23 09:48  Vitals shown include unfiled device data.  Last Pain:  Vitals:   10/24/23 0624  TempSrc: Oral  PainSc:       Patients Stated Pain Goal: 4 (10/24/23 9390)  Complications: No notable events documented.

## 2023-10-24 NOTE — Anesthesia Postprocedure Evaluation (Signed)
 Anesthesia Post Note  Patient: Garrett Hanson  Procedure(s) Performed: ATRIAL FIBRILLATION ABLATION     Patient location during evaluation: PACU Anesthesia Type: General Level of consciousness: awake and alert Pain management: pain level controlled Vital Signs Assessment: post-procedure vital signs reviewed and stable Respiratory status: spontaneous breathing, nonlabored ventilation, respiratory function stable and patient connected to nasal cannula oxygen Cardiovascular status: blood pressure returned to baseline and stable Postop Assessment: no apparent nausea or vomiting Anesthetic complications: no   No notable events documented.  Last Vitals:  Vitals:   10/24/23 1029 10/24/23 1045  BP: 110/75 107/73  Pulse: 80 79  Resp: 16 20  Temp:    SpO2: 96% 96%    Last Pain:  Vitals:   10/24/23 1023  TempSrc:   PainSc: 0-No pain                 Thom JONELLE Peoples

## 2023-10-24 NOTE — Anesthesia Preprocedure Evaluation (Addendum)
 Anesthesia Evaluation  Patient identified by MRN, date of birth, ID band Patient awake    Reviewed: Allergy & Precautions, NPO status , Patient's Chart, lab work & pertinent test results, reviewed documented beta blocker date and time   History of Anesthesia Complications (+) history of anesthetic complications  Airway Mallampati: I  TM Distance: >3 FB Neck ROM: Full    Dental no notable dental hx. (+) Teeth Intact, Dental Advisory Given, Caps   Pulmonary sleep apnea and Continuous Positive Airway Pressure Ventilation , pneumonia, resolved   Pulmonary exam normal breath sounds clear to auscultation       Cardiovascular hypertension, Pt. on medications and Pt. on home beta blockers Normal cardiovascular exam+ dysrhythmias Atrial Fibrillation  Rhythm:Regular Rate:Normal     Neuro/Psych negative neurological ROS  negative psych ROS   GI/Hepatic negative GI ROS, Neg liver ROS,,,  Endo/Other    Renal/GU Renal diseaseHx/o renal calculi  negative genitourinary   Musculoskeletal  (+) Arthritis , Osteoarthritis,    Abdominal  (+) + obese  Peds  Hematology Eliquis  therapy- last dose this am   Anesthesia Other Findings   Reproductive/Obstetrics                              Anesthesia Physical Anesthesia Plan  ASA: 3  Anesthesia Plan: General   Post-op Pain Management: Minimal or no pain anticipated   Induction: Intravenous  PONV Risk Score and Plan: 2 and Ondansetron , Dexamethasone  and Treatment may vary due to age or medical condition  Airway Management Planned: Oral ETT  Additional Equipment: None  Intra-op Plan:   Post-operative Plan: Extubation in OR  Informed Consent: I have reviewed the patients History and Physical, chart, labs and discussed the procedure including the risks, benefits and alternatives for the proposed anesthesia with the patient or authorized representative who  has indicated his/her understanding and acceptance.     Dental advisory given  Plan Discussed with: CRNA and Anesthesiologist  Anesthesia Plan Comments:          Anesthesia Quick Evaluation

## 2023-10-24 NOTE — Anesthesia Procedure Notes (Signed)
 Procedure Name: Intubation Date/Time: 10/24/2023 8:09 AM  Performed by: Harrold Macintosh, CRNAPre-anesthesia Checklist: Patient identified, Emergency Drugs available, Suction available and Patient being monitored Patient Re-evaluated:Patient Re-evaluated prior to induction Oxygen Delivery Method: Circle system utilized Preoxygenation: Pre-oxygenation with 100% oxygen Induction Type: IV induction Ventilation: Oral airway inserted - appropriate to patient size and Two handed mask ventilation required Laryngoscope Size: Miller and 3 Grade View: Grade II Tube type: Oral Tube size: 7.5 mm Number of attempts: 1 Airway Equipment and Method: Stylet and Bite block Placement Confirmation: ETT inserted through vocal cords under direct vision, positive ETCO2 and breath sounds checked- equal and bilateral Secured at: 23 cm Tube secured with: Tape Dental Injury: Teeth and Oropharynx as per pre-operative assessment

## 2023-10-24 NOTE — Progress Notes (Addendum)
 Called to room by CRNA Avelina B and she stated IV infiltrated and asked me to restart IV; client c/o IV site being tender; no swelling noted by me; IV left antecubital removed and attempted to restart IV right hand and notified CRNA Avelina B. unable to restart IV and she was in to restart IV

## 2023-10-26 ENCOUNTER — Encounter (HOSPITAL_COMMUNITY): Payer: Self-pay | Admitting: Cardiovascular Disease

## 2023-10-28 ENCOUNTER — Telehealth (HOSPITAL_COMMUNITY): Payer: Self-pay

## 2023-10-28 NOTE — Telephone Encounter (Signed)
 Spoke with patient to complete post procedure follow up call.  Patient reports no complications with groin sites.   Instructions reviewed with patient:  It is normal to have bruising, tenderness, mild swelling, and a pea or marble sized lump/knot at the groin site which can take up to three months to resolve.  Get help right away if you notice sudden swelling at the puncture site.  Check your puncture site every day for signs of infection: fever, redness, swelling, pus drainage, warmth, foul odor or excessive pain. If this occurs, please call the office at [phone], to speak with the nurse. Get help right away if your puncture site is bleeding and the bleeding does not stop after applying firm pressure to the area.  You may continue to have skipped beats/ atrial fibrillation during the first several months after your procedure.  It is very important not to miss any doses of your blood thinner Eliquis .    You will follow up with the Afib clinic on 11/21/23 and follow up with or Dr.Augustus Mealor.   Patient verbalized understanding to all instructions provided.

## 2023-11-21 ENCOUNTER — Ambulatory Visit (HOSPITAL_COMMUNITY)
Admission: RE | Admit: 2023-11-21 | Discharge: 2023-11-21 | Disposition: A | Discharge status: Home / Self Care | Source: Ambulatory Visit | Attending: Physician Assistant | Admitting: Physician Assistant

## 2023-11-21 ENCOUNTER — Encounter (HOSPITAL_COMMUNITY): Payer: Self-pay | Admitting: Physician Assistant

## 2023-11-21 VITALS — BP 118/84 | HR 72 | Ht 73.5 in | Wt 268.8 lb

## 2023-11-21 DIAGNOSIS — I4819 Other persistent atrial fibrillation: Secondary | ICD-10-CM | POA: Diagnosis not present

## 2023-11-21 MED ORDER — METOPROLOL TARTRATE 25 MG PO TABS: 12.5000 mg | ORAL_TABLET | Freq: Two times a day (BID) | ORAL | 3 refills | Status: DC

## 2023-11-21 NOTE — Progress Notes (Signed)
 Primary Care Physician: Okey Carlin Redbird, MD Primary Cardiologist: Jerel Balding, MD Electrophysiologist: Eulas FORBES Furbish, MD  Referring Physician: Dr Furbish Oneil JINNY Vivia is a 63 y.o. male with a history of OSA, CKD, atrial fibrillation who presents for follow up in the [facility].  Patient is s/p pulmonary vein isolation by Dr. Kelsie in August 2021 for persistent atrial fibrillation.  Prior to this he tried and failed dofetilide  therapy and had multiple cardioversions.  He had recurrence of atrial fibrillation in May 2025 and was started on flecainide  which organized his rhythm to typical-appearing right atrial flutter.  He underwent DC cardioversion on September 30, 2023. He was seen by Dr Furbish and underwent afib and flutter ablation on 10/24/23. Patient is on Eliquis  for stroke prevention.    Patient presents today for follow up for atrial fibrillation and atrial flutter. He remains in SR today. He denies chest pain or bleeding issues. He does have a pulling sensation at his right groin which has been present since his last ablation in 2021, no swelling or bleeding. He does report lingering fatigue.   Today, he denies symptoms of palpitations, chest pain, shortness of breath, orthopnea, PND, lower extremity edema, dizziness, presyncope, syncope, bleeding, or neurologic sequela. The patient is tolerating medications without difficulties and is otherwise without complaint today.    Atrial Fibrillation Risk Factors:  he does have symptoms or diagnosis of sleep apnea. he does not have a history of rheumatic fever.   Atrial Fibrillation Management history:  Previous antiarrhythmic drugs: dofetilide , flecainide  Previous cardioversions: 2018 x 2, 2019, 2020, 2021, 09/30/23 Previous ablations: 2021, 10/24/23 Anticoagulation history: Eliquis   ROS- All systems are reviewed and negative except as per the HPI above.  Past Medical History:  Diagnosis Date    Arthritis    left knee; probably in the shoulders; left hip (02/05/2017)   Chronic bronchitis (HCC)    get it ~ q other year (02/05/2017)   Chronic urticaria    CKD (chronic kidney disease), stage II    pt denies this hx on 02/05/2017   Complication of anesthesia    pt states awaken during colonscopy    Dysrhythmia    a fib   Essential hypertension    History of blood transfusion 1961   History of common duodenal ulcer 1980   History of kidney stones    Lymphadenopathy    a. evaluated by heme-onc 04/2016, under surveillance.   OSA on CPAP dx'd ~ 09/2016   Persistent atrial fibrillation (HCC)    Pneumonia    X2 (02/05/2017)   Pure hypercholesterolemia     Current Outpatient Medications  Medication Sig Dispense Refill   apixaban  (ELIQUIS ) 5 MG TABS tablet Take 1 tablet (5 mg total) by mouth 2 (two) times daily. 180 tablet 0   atorvastatin  (LIPITOR) 40 MG tablet Take 1 tablet (40 mg total) by mouth every evening. 90 tablet 0   Cholecalciferol (VITAMIN D3) 25 MCG (1000 UT) CAPS Take 1,000 Units by mouth daily.     famotidine  (PEPCID ) 40 MG tablet Take 40 mg by mouth daily.     losartan  (COZAAR ) 50 MG tablet Take 1 tablet (50 mg total) by mouth daily. 90 tablet 3   metoprolol  tartrate (LOPRESSOR ) 25 MG tablet Take 1 tablet (25 mg total) by mouth 2 (two) times daily. 180 tablet 3   Probiotic Product (PROBIOTIC PO) Take 1 tablet by mouth daily.     spironolactone  (ALDACTONE ) 25 MG  tablet Take 1 tablet (25 mg total) by mouth daily. 90 tablet 0   tirzepatide (ZEPBOUND) 7.5 MG/0.5ML Pen Inject 0.5 mLs into the skin once a week.     No current facility-administered medications for this encounter.    Physical Exam: BP 118/84   Pulse 72   Ht 6' 1.5 (1.867 m)   Wt 121.9 kg   BMI 34.98 kg/m   GEN: Well nourished, well developed in no acute distress CARDIAC: Regular rate and rhythm, no murmurs, rubs, gallops RESPIRATORY:  Clear to auscultation without rales, wheezing or rhonchi   ABDOMEN: Soft, non-tender, non-distended EXTREMITIES:  No edema; No deformity   Wt Readings from Last 3 Encounters:  11/21/23 121.9 kg  10/24/23 124.7 kg  10/03/23 127 kg     EKG today demonstrates  SR Vent. rate 72 BPM PR interval 160 ms QRS duration 102 ms QT/QTcB 392/429 ms   Echo 09/12/23 demonstrated   1. Left ventricular ejection fraction, by estimation, is 60 to 65% with  beat to beat variability. The left ventricle has normal function. The left  ventricle has no regional wall motion abnormalities. There is mild left  ventricular hypertrophy. Left ventricular diastolic parameters are indeterminate.   2. Right ventricular systolic function is normal. The right ventricular  size is normal. Tricuspid regurgitation signal is inadequate for assessing  PA pressure.   3. Left atrial size was mildly dilated.   4. Right atrial size was mildly dilated.   5. The mitral valve is grossly normal. Mild mitral valve regurgitation.  No evidence of mitral stenosis.   6. The aortic valve is grossly normal. There is mild calcification of the  aortic valve. Aortic valve regurgitation is not visualized. No aortic  stenosis is present.   7. The inferior vena cava is normal in size with greater than 50%  respiratory variability, suggesting right atrial pressure of 3 mmHg.    CHA2DS2-VASc Score = 1  The patient's score is based upon: CHF History: 0 HTN History: 1 Diabetes History: 0 Stroke History: 0 Vascular Disease History: 0 Age Score: 0 Gender Score: 0       ASSESSMENT AND PLAN: Persistent Atrial Fibrillation/atrial flutter (ICD10:  I48.19) The patient's CHA2DS2-VASc score is 1, indicating a 0.6% annual risk of stroke.   S/p afib ablation 2021, repeat afib and flutter ablation 10/24/23 Patient appears to be maintaining SR Continue Eliquis  5 mg BID with no missed doses for 3 months post ablation.  Decrease Lopressor  to 12.5 mg BID to see if this helps with fatigue.    HTN Stable on current regimen  OSA  Encouraged nightly CPAP  Obesity Body mass index is 34.98 kg/m.  Encouraged lifestyle modification On Zepbound    Follow up with Dr Nancey as scheduled.        [facility] [address] [place], [place] [address] [phone]

## 2023-12-07 ENCOUNTER — Other Ambulatory Visit: Payer: Self-pay | Admitting: Cardiology

## 2023-12-07 DIAGNOSIS — E7800 Pure hypercholesterolemia, unspecified: Secondary | ICD-10-CM

## 2023-12-09 NOTE — Telephone Encounter (Signed)
 Prescription refill request for Eliquis  received. Indication:afib Last office visit:7/25 Scr:1.26  6/25 Age: 63 Weight:121.9  kg  Prescription refilled

## 2024-01-29 ENCOUNTER — Ambulatory Visit: Attending: Cardiology | Admitting: Cardiovascular Disease

## 2024-01-29 ENCOUNTER — Encounter: Payer: Self-pay | Admitting: Cardiovascular Disease

## 2024-01-29 VITALS — BP 118/79 | HR 78 | Ht 73.5 in | Wt 260.0 lb

## 2024-01-29 DIAGNOSIS — I4819 Other persistent atrial fibrillation: Secondary | ICD-10-CM

## 2024-01-29 NOTE — Patient Instructions (Addendum)
 Medication Instructions:  Your physician has recommended you make the following change in your medication:   ** Stop Eliquis    *If you need a refill on your cardiac medications before your next appointment, please call your pharmacy*  Lab Work: None ordered.  If you have labs (blood work) drawn today and your tests are completely normal, you will receive your results only by: MyChart Message (if you have MyChart) OR A paper copy in the mail If you have any lab test that is abnormal or we need to change your treatment, we will call you to review the results.  Testing/Procedures: None ordered.   Follow-Up: At [facility], you and your health needs are our priority.  As part of our continuing mission to provide you with exceptional heart care, our providers are all part of one team.  This team includes your primary Cardiologist (physician) and Advanced Practice Providers or APPs (Physician Assistants and Nurse Practitioners) who all work together to provide you with the care you need, when you need it.  Your next appointment:   6 months with Afib Clinic

## 2024-01-29 NOTE — Progress Notes (Signed)
  Electrophysiology Office Note:    Date:  01/29/2024   ID:  Garrett Hanson, DOB December 12, 1960, MRN [id number]  PCP:  Okey Carlin Redbird, MD   [facility] Providers Cardiologist:  Jerel Balding, MD Electrophysiologist:  Eulas FORBES Furbish, MD     Referring MD: Okey Carlin Redbird, MD   History of Present Illness:    Garrett Hanson is a 63 y.o. male with a medical history significant for persistent atrial fibrillation, obesity, sleep apnea on CPAP, CKD 3 referred for atrial fibrillation management.     S/p pulmonary vein isolation by Dr. Kelsie in August 2021 for persistent atrial fibrillation.  Prior to this he tried and failed dofetilide  therapy and had multiple cardioversions.  He had recurrence of atrial fibrillation in May 2025 and was started on flecainide  which organized his rhythm to typical-appearing right atrial flutter.  He underwent ablation of atrial fibrillation of the pulmonary veins and posterior wall of the left atrium with pulsed field energy as well as ablation of the cavotricuspid isthmus for typical atrial flutter on October 24, 2023.        Today, he reports that he is at baseline and feels well.  EKGs/Labs/Other Studies Reviewed Today:     Echocardiogram:  TTE Sep 12, 2023 LVEF 60 to 65%.  Mild LVH.  Mildly dilated left atrium, mildly dilated right atrium.    EKG:   EKG Interpretation Date/Time:  Wednesday January 29 2024 11:58:08 EDT Ventricular Rate:  78 PR Interval:  166 QRS Duration:  102 QT Interval:  396 QTC Calculation: 451 R Axis:   49  Text Interpretation: Normal sinus rhythm Normal ECG When compared with ECG of 21-Nov-2023 14:44, No significant change was found Confirmed by Furbish Eulas [phone]) on 01/29/2024 12:04:39 PM     Physical Exam:    VS:  BP 118/79   Pulse 78   Ht 6' 1.5 (1.867 m)   Wt 260 lb (117.9 kg)   SpO2 96%   BMI 33.84 kg/m     Wt Readings from Last 3 Encounters:  01/29/24 260 lb (117.9 kg)  11/21/23  268 lb 12.8 oz (121.9 kg)  10/24/23 275 lb (124.7 kg)     GEN: Well nourished, well developed in no acute distress CARDIAC: RRR, no murmurs, rubs, gallops RESPIRATORY:  Normal work of breathing MUSCULOSKELETAL: no edema    ASSESSMENT & PLAN:     Persistent atrial fibrillation, typical atrial flutter Status post pulmonary vein isolation in 2020 by Dr. Kelsie Patient had recurrence of atrial fibrillation and typical appearing flutter while on flecainide  He underwent ablation of atrial fibrillation and typical flutter on October 24, 2023. He is maintaining sinus rhythm  Secondary hypercoagulable state CHA2DS2-VASc score is 1 Will discontinue Eliquis    Hypertension Had increased fatigue metoprolol  50 Now on metoprolol  12.5 twice daily He is going to pause metoprolol  to see if there is any improvement in erectile dysfunction BP controlled today  Obesity He has been encouraged to be compliant with CPAP to reduce the likelihood of A-fib recurrence  Obesity He is making progress with weight loss on Zepbound    Signed, Eulas FORBES Furbish, MD  01/29/2024 12:23 PM    [facility]

## 2024-01-29 NOTE — Addendum Note (Signed)
 Addended by: CASIMIR ALDONA BRAVO on: 01/29/2024 12:48 PM   Modules accepted: Orders

## 2024-02-21 ENCOUNTER — Other Ambulatory Visit (HOSPITAL_COMMUNITY): Payer: Self-pay | Admitting: Physician Assistant
